# Patient Record
Sex: Female | Born: 1955 | Hispanic: No | Marital: Married | Smoking: Never smoker
Health system: Southern US, Community
[De-identification: ages and names within clinical notes are randomized; demographics above are authoritative.]

## PROBLEM LIST (undated history)

## (undated) DIAGNOSIS — U071 COVID-19: Secondary | ICD-10-CM

## (undated) DIAGNOSIS — K589 Irritable bowel syndrome without diarrhea: Secondary | ICD-10-CM

## (undated) DIAGNOSIS — F419 Anxiety disorder, unspecified: Secondary | ICD-10-CM

## (undated) DIAGNOSIS — D649 Anemia, unspecified: Secondary | ICD-10-CM

## (undated) DIAGNOSIS — K219 Gastro-esophageal reflux disease without esophagitis: Secondary | ICD-10-CM

## (undated) DIAGNOSIS — E079 Disorder of thyroid, unspecified: Secondary | ICD-10-CM

## (undated) HISTORY — DX: Gastro-esophageal reflux disease without esophagitis: K21.9

## (undated) HISTORY — DX: Irritable bowel syndrome, unspecified: K58.9

## (undated) HISTORY — DX: Anxiety disorder, unspecified: F41.9

## (undated) HISTORY — PX: HERNIA REPAIR: SHX51

## (undated) HISTORY — DX: COVID-19: U07.1

## (undated) HISTORY — DX: Disorder of thyroid, unspecified: E07.9

## (undated) HISTORY — DX: Anemia, unspecified: D64.9

---

## 2016-03-17 ENCOUNTER — Ambulatory Visit (INDEPENDENT_AMBULATORY_CARE_PROVIDER_SITE_OTHER): Payer: Self-pay | Admitting: Family Medicine

## 2016-03-17 VITALS — BP 122/82 | HR 92 | Temp 98.5°F | Resp 16 | Wt 166.0 lb

## 2016-03-17 DIAGNOSIS — K2100 Gastro-esophageal reflux disease with esophagitis, without bleeding: Secondary | ICD-10-CM

## 2016-03-17 MED ORDER — OMEPRAZOLE 40 MG PO CPDR: 40.0000 mg | DELAYED_RELEASE_CAPSULE | Freq: Every day | ORAL | 3 refills | Status: DC

## 2016-03-17 NOTE — Patient Instructions (Signed)
Start omeprazole 40 mg once daily for 6 weeks. Return for follow-up in 6 weeks.  I have placed a referral for gastroenterology.    IF you received an x-ray today, you will receive an invoice from [facility]. Please contact [facility] at [phone] with questions or concerns regarding your invoice.   IF you received labwork today, you will receive an invoice from [employer]. Please contact [facility] at [phone] with questions or concerns regarding your invoice.   Our billing staff will not be able to assist you with questions regarding bills from these companies.  You will be contacted with the lab results as soon as they are available. The fastest way to get your results is to activate your My Chart account. Instructions are located on the last page of this paperwork. If you have not heard from [place] regarding the results in 2 weeks, please contact this office.           Gastroesophageal Reflux Disease, Adult Normally, food travels down the esophagus and stays in the stomach to be digested. However, when a person has gastroesophageal reflux disease (GERD), food and stomach acid move back up into the esophagus. When this happens, the esophagus becomes sore and inflamed. Over time, GERD can create small holes (ulcers) in the lining of the esophagus.  CAUSES This condition is caused by a problem with the muscle between the esophagus and the stomach (lower esophageal sphincter, or LES). Normally, the LES muscle closes after food passes through the esophagus to the stomach. When the LES is weakened or abnormal, it does not close properly, and that allows food and stomach acid to go back up into the esophagus. The LES can be weakened by certain dietary substances, medicines, and medical conditions, including:  Tobacco use.  Pregnancy.  Having a hiatal hernia.  Heavy alcohol use.  Certain foods and beverages, such as coffee, chocolate,  onions, and peppermint. RISK FACTORS This condition is more likely to develop in:  People who have an increased body weight.  People who have connective tissue disorders.  People who use NSAID medicines. SYMPTOMS Symptoms of this condition include:  Heartburn.  Difficult or painful swallowing.  The feeling of having a lump in the throat.  Abitter taste in the mouth.  Bad breath.  Having a large amount of saliva.  Having an upset or bloated stomach.  Belching.  Chest pain.  Shortness of breath or wheezing.  Ongoing (chronic) cough or a night-time cough.  Wearing away of tooth enamel.  Weight loss. Different conditions can cause chest pain. Make sure to see your health care provider if you experience chest pain. DIAGNOSIS Your health care provider will take a medical history and perform a physical exam. To determine if you have mild or severe GERD, your health care provider may also monitor how you respond to treatment. You may also have other tests, including:  An endoscopy toexamine your stomach and esophagus with a small camera.  A test thatmeasures the acidity level in your esophagus.  A test thatmeasures how much pressure is on your esophagus.  A barium swallow or modified barium swallow to show the shape, size, and functioning of your esophagus. TREATMENT The goal of treatment is to help relieve your symptoms and to prevent complications. Treatment for this condition may vary depending on how severe your symptoms are. Your health care provider may recommend:  Changes to your diet.  Medicine.  Surgery. HOME CARE INSTRUCTIONS Diet  Follow a diet  as recommended by your health care provider. This may involve avoiding foods and drinks such as:  Coffee and tea (with or without caffeine).  Drinks that containalcohol.  Energy drinks and sports drinks.  Carbonated drinks or sodas.  Chocolate and cocoa.  Peppermint and mint flavorings.  Garlic  and onions.  Horseradish.  Spicy and acidic foods, including peppers, chili powder, curry powder, vinegar, hot sauces, and barbecue sauce.  Citrus fruit juices and citrus fruits, such as oranges, lemons, and limes.  Tomato-based foods, such as red sauce, chili, salsa, and pizza with red sauce.  Fried and fatty foods, such as donuts, french fries, potato chips, and high-fat dressings.  High-fat meats, such as hot dogs and fatty cuts of red and white meats, such as rib eye steak, sausage, ham, and bacon.  High-fat dairy items, such as whole milk, butter, and cream cheese.  Eat small, frequent meals instead of large meals.  Avoid drinking large amounts of liquid with your meals.  Avoid eating meals during the 2-3 hours before bedtime.  Avoid lying down right after you eat.  Do not exercise right after you eat. General Instructions  Pay attention to any changes in your symptoms.  Take over-the-counter and prescription medicines only as told by your health care provider. Do not take aspirin, ibuprofen, or other NSAIDs unless your health care provider told you to do so.  Do not use any tobacco products, including cigarettes, chewing tobacco, and e-cigarettes. If you need help quitting, ask your health care provider.  Wear loose-fitting clothing. Do not wear anything tight around your waist that causes pressure on your abdomen.  Raise (elevate) the head of your bed 6 inches (15cm).  Try to reduce your stress, such as with yoga or meditation. If you need help reducing stress, ask your health care provider.  If you are overweight, reduce your weight to an amount that is healthy for you. Ask your health care provider for guidance about a safe weight loss goal.  Keep all follow-up visits as told by your health care provider. This is important. SEEK MEDICAL CARE IF:  You have new symptoms.  You have unexplained weight loss.  You have difficulty swallowing, or it hurts to  swallow.  You have wheezing or a persistent cough.  Your symptoms do not improve with treatment.  You have a hoarse voice. SEEK IMMEDIATE MEDICAL CARE IF:  You have pain in your arms, neck, jaw, teeth, or back.  You feel sweaty, dizzy, or light-headed.  You have chest pain or shortness of breath.  You vomit and your vomit looks like blood or coffee grounds.  You faint.  Your stool is bloody or black.  You cannot swallow, drink, or eat.   This information is not intended to replace advice given to you by your health care provider. Make sure you discuss any questions you have with your health care provider.   Document Released: 03/16/2005 Document Revised: 02/25/2015 Document Reviewed: 10/01/2014 Elsevier Interactive Patient Education [employer] Elsevier Inc.

## 2016-03-17 NOTE — Progress Notes (Signed)
Patient ID: Kristen Bender, female    DOB: 08/25/1955, 60 y.o.   MRN: [id number]  PCP: No primary care provider on file.  Chief Complaint  Patient presents with  . Gastroesophageal Reflux    x 2 months with nausea and burning epigastric. constipation .    Subjective:   HPI 60 year old, female, presents for evaluation of nausea, burning, and epigastric pain time 2 months. She and her husband very recently arrived to [place] from [place]. While in [place], the patient received a complete gastroenterology work up and has copies of findings.  The results are written in her native language.  She reports a known history of acid reflux and feels that is the underlying cause of her symptoms. She reports nausea that occurs with burning epigastric pain which worsened with food and at bedtime.  She reports experiencing burning in throat with cough although this doesn't occur all the time.  She reports previously taking ranitidine and rabeprazole with some relief of symptoms. She is now taking medications prescribed in [place] which she reports are not relieving her symptoms at all.  Social History   Social History  . Marital status: Married    Spouse name: N/A  . Number of children: N/A  . Years of education: N/A   Occupational History  . Not on file.   Social History Main Topics  . Smoking status: Never Smoker  . Smokeless tobacco: Never Used  . Alcohol use Not on file  . Drug use: Unknown  . Sexual activity: Not on file   Other Topics Concern  . Not on file   Social History Narrative  . No narrative on file   Family History  Problem Relation Age of Onset  . Heart disease Mother   . Hypertension Mother   . Mental illness Mother   . Heart disease Father   . Hypertension Father    Review of Systems  See HPI  There are no active problems to display for this patient.   Prior to Admission medications   Medication Sig Start Date End Date Taking? Authorizing Provider    Cyanocobalamin (B12 LIQUID HEALTH BOOSTER PO) Take by mouth.   Yes Historical Provider, MD  sertraline (ZOLOFT) 50 MG tablet Take 50 mg by mouth daily.   Yes Historical Provider, MD   No Known Allergies     Objective:  Physical Exam  Constitutional: She is oriented to person, place, and time. She appears well-developed and well-nourished.  HENT:  Head: Normocephalic and atraumatic.  Right Ear: External ear normal.  Left Ear: External ear normal.  Nose: Nose normal.  Mouth/Throat: Oropharynx is clear and moist.  Eyes: Conjunctivae and EOM are normal. Pupils are equal, round, and reactive to light.  Neck: Normal range of motion. Neck supple.  Cardiovascular: Normal rate, regular rhythm, normal heart sounds and intact distal pulses.   Pulmonary/Chest: Effort normal and breath sounds normal.  Abdominal: Soft. Bowel sounds are normal.  Musculoskeletal: Normal range of motion.  Neurological: She is alert and oriented to person, place, and time.  Skin: Skin is warm and dry.  Psychiatric: She has a normal mood and affect. Her behavior is normal. Judgment and thought content normal.   Vitals:   03/17/16 1206  BP: 122/82  Pulse: 92  Resp: 16  Temp: 98.5 F (36.9 C)     Assessment & Plan:  1. Gastroesophageal reflux disease with esophagitis 60 year old female presents with symptoms that are likely related to acid  reflux flare.  She presents multiple copies of diagnostic studies all transcribed in her native language.   All diagnostic studies were obtained less than 30 days ago. Today, treat the acid reflux symptoms and refer patient to local gastroenterologist as she was previously under specialist care in her native country.  Plan:   . omeprazole (PRILOSEC) 40 MG capsule    Sig: Take 1 capsule (40 mg total) by mouth daily.    Dispense:  30 capsule    Refill:  3    Order Specific Question:   Supervising Provider    Answer:   Clelia CroftSHAW, EVA N [[id number]]   - Ambulatory referral to  Gastroenterology  [facility].  Godfrey PickKimberly S. Tiburcio PeaHarris, MSN, FNP-C [facility]

## 2016-03-18 DIAGNOSIS — K219 Gastro-esophageal reflux disease without esophagitis: Secondary | ICD-10-CM | POA: Insufficient documentation

## 2016-06-23 ENCOUNTER — Encounter: Payer: Self-pay | Admitting: Family Medicine

## 2019-06-21 DIAGNOSIS — J8489 Other specified interstitial pulmonary diseases: Secondary | ICD-10-CM

## 2019-06-21 HISTORY — DX: Other specified interstitial pulmonary diseases: J84.89

## 2019-09-02 ENCOUNTER — Emergency Department (HOSPITAL_COMMUNITY): Payer: HRSA Program

## 2019-09-02 ENCOUNTER — Encounter (HOSPITAL_COMMUNITY): Payer: Self-pay

## 2019-09-02 ENCOUNTER — Other Ambulatory Visit: Payer: Self-pay

## 2019-09-02 ENCOUNTER — Inpatient Hospital Stay (HOSPITAL_COMMUNITY)
Admission: EM | Admit: 2019-09-02 | Discharge: 2019-09-10 | DRG: 177 | Disposition: A | Discharge status: Home Health | Payer: HRSA Program | Attending: Internal Medicine | Admitting: Internal Medicine

## 2019-09-02 DIAGNOSIS — R739 Hyperglycemia, unspecified: Secondary | ICD-10-CM | POA: Diagnosis present

## 2019-09-02 DIAGNOSIS — K219 Gastro-esophageal reflux disease without esophagitis: Secondary | ICD-10-CM | POA: Diagnosis present

## 2019-09-02 DIAGNOSIS — J1282 Pneumonia due to coronavirus disease 2019: Secondary | ICD-10-CM | POA: Diagnosis present

## 2019-09-02 DIAGNOSIS — J309 Allergic rhinitis, unspecified: Secondary | ICD-10-CM | POA: Diagnosis present

## 2019-09-02 DIAGNOSIS — D539 Nutritional anemia, unspecified: Secondary | ICD-10-CM | POA: Diagnosis not present

## 2019-09-02 DIAGNOSIS — Z818 Family history of other mental and behavioral disorders: Secondary | ICD-10-CM

## 2019-09-02 DIAGNOSIS — Z8249 Family history of ischemic heart disease and other diseases of the circulatory system: Secondary | ICD-10-CM | POA: Diagnosis not present

## 2019-09-02 DIAGNOSIS — U071 COVID-19: Secondary | ICD-10-CM | POA: Diagnosis present

## 2019-09-02 DIAGNOSIS — I10 Essential (primary) hypertension: Secondary | ICD-10-CM | POA: Diagnosis present

## 2019-09-02 DIAGNOSIS — F329 Major depressive disorder, single episode, unspecified: Secondary | ICD-10-CM | POA: Diagnosis present

## 2019-09-02 DIAGNOSIS — T380X5A Adverse effect of glucocorticoids and synthetic analogues, initial encounter: Secondary | ICD-10-CM | POA: Diagnosis present

## 2019-09-02 DIAGNOSIS — K7689 Other specified diseases of liver: Secondary | ICD-10-CM | POA: Diagnosis present

## 2019-09-02 DIAGNOSIS — R0602 Shortness of breath: Secondary | ICD-10-CM

## 2019-09-02 DIAGNOSIS — Z79899 Other long term (current) drug therapy: Secondary | ICD-10-CM

## 2019-09-02 DIAGNOSIS — F410 Panic disorder [episodic paroxysmal anxiety] without agoraphobia: Secondary | ICD-10-CM | POA: Diagnosis present

## 2019-09-02 DIAGNOSIS — J9601 Acute respiratory failure with hypoxia: Secondary | ICD-10-CM | POA: Diagnosis present

## 2019-09-02 LAB — COMPREHENSIVE METABOLIC PANEL
ALT: 59 U/L — ABNORMAL HIGH (ref 0–44)
AST: 38 U/L (ref 15–41)
Albumin: 3.6 g/dL (ref 3.5–5.0)
Alkaline Phosphatase: 65 U/L (ref 38–126)
Anion gap: 13 (ref 5–15)
BUN: 15 mg/dL (ref 8–23)
CO2: 22 mmol/L (ref 22–32)
Calcium: 9.5 mg/dL (ref 8.9–10.3)
Chloride: 105 mmol/L (ref 98–111)
Creatinine, Ser: 0.89 mg/dL (ref 0.44–1.00)
GFR calc Af Amer: >60 mL/min (ref >60)
GFR calc non Af Amer: >60 mL/min (ref >60)
Glucose, Bld: 183 mg/dL — ABNORMAL HIGH (ref 70–99)
Potassium: 3.7 mmol/L (ref 3.5–5.1)
Sodium: 140 mmol/L (ref 135–145)
Total Bilirubin: 0.9 mg/dL (ref 0.3–1.2)
Total Protein: 7.9 g/dL (ref 6.5–8.1)

## 2019-09-02 LAB — CBC WITH DIFFERENTIAL/PLATELET
Abs Immature Granulocytes: 0.26 10*3/uL — ABNORMAL HIGH (ref 0.00–0.07)
Basophils Absolute: 0 10*3/uL (ref 0.0–0.1)
Basophils Relative: 0 %
Eosinophils Absolute: 0 10*3/uL (ref 0.0–0.5)
Eosinophils Relative: 0 %
HCT: 46.1 % — ABNORMAL HIGH (ref 36.0–46.0)
Hemoglobin: 14.8 g/dL (ref 12.0–15.0)
Immature Granulocytes: 2 %
Lymphocytes Relative: 8 %
Lymphs Abs: 1.1 10*3/uL (ref 0.7–4.0)
MCH: 32.1 pg (ref 26.0–34.0)
MCHC: 32.1 g/dL (ref 30.0–36.0)
MCV: 100 fL (ref 80.0–100.0)
Monocytes Absolute: 0.7 10*3/uL (ref 0.1–1.0)
Monocytes Relative: 5 %
Neutro Abs: 11.6 10*3/uL — ABNORMAL HIGH (ref 1.7–7.7)
Neutrophils Relative %: 85 %
Platelets: 353 10*3/uL (ref 150–400)
RBC: 4.61 MIL/uL (ref 3.87–5.11)
RDW: 12.8 % (ref 11.5–15.5)
WBC: 13.7 10*3/uL — ABNORMAL HIGH (ref 4.0–10.5)
nRBC: 0 % (ref 0.0–0.2)

## 2019-09-02 LAB — PROTIME-INR
INR: 1 (ref 0.8–1.2)
Prothrombin Time: 13.4 seconds (ref 11.4–15.2)

## 2019-09-02 LAB — BRAIN NATRIURETIC PEPTIDE: B Natriuretic Peptide: 80.7 pg/mL (ref 0.0–100.0)

## 2019-09-02 LAB — TROPONIN I (HIGH SENSITIVITY)
Troponin I (High Sensitivity): 12 ng/L (ref <18)
Troponin I (High Sensitivity): 61 ng/L — ABNORMAL HIGH (ref <18)

## 2019-09-02 LAB — APTT: aPTT: 26 seconds (ref 24–36)

## 2019-09-02 MED ORDER — SODIUM CHLORIDE 0.9 % IV SOLN
100.0000 mg | INTRAVENOUS | Status: AC
  Administered 2019-09-03 (×2): 100 mg via INTRAVENOUS
  Filled 2019-09-02 (×2): qty 20

## 2019-09-02 MED ORDER — SODIUM CHLORIDE 0.9 % IV BOLUS (SEPSIS)
1000.0000 mL | Freq: Once | INTRAVENOUS | Status: AC
  Administered 2019-09-02: 1000 mL via INTRAVENOUS

## 2019-09-02 MED ORDER — ALBUTEROL SULFATE HFA 108 (90 BASE) MCG/ACT IN AERS
8.0000 | INHALATION_SPRAY | Freq: Once | RESPIRATORY_TRACT | Status: AC
  Administered 2019-09-02: 8 via RESPIRATORY_TRACT
  Filled 2019-09-02: qty 6.7

## 2019-09-02 MED ORDER — ALBUTEROL (5 MG/ML) CONTINUOUS INHALATION SOLN: 10.0000 mg/h | INHALATION_SOLUTION | Freq: Once | RESPIRATORY_TRACT | Status: DC

## 2019-09-02 MED ORDER — SODIUM CHLORIDE 0.9 % IV SOLN
100.0000 mg | Freq: Every day | INTRAVENOUS | Status: AC
  Administered 2019-09-04 – 2019-09-07 (×4): 100 mg via INTRAVENOUS
  Filled 2019-09-02 (×4): qty 20

## 2019-09-02 MED ORDER — IOHEXOL 350 MG/ML SOLN
100.0000 mL | Freq: Once | INTRAVENOUS | Status: AC | PRN
  Administered 2019-09-02: 100 mL via INTRAVENOUS

## 2019-09-02 MED ORDER — SODIUM CHLORIDE 0.9 % IV SOLN
500.0000 mg | INTRAVENOUS | Status: DC
  Administered 2019-09-02: 500 mg via INTRAVENOUS
  Filled 2019-09-02: qty 500

## 2019-09-02 MED ORDER — SODIUM CHLORIDE 0.9 % IV SOLN
2.0000 g | INTRAVENOUS | Status: DC
  Administered 2019-09-02: 2 g via INTRAVENOUS
  Filled 2019-09-02: qty 20

## 2019-09-02 MED ORDER — SODIUM CHLORIDE 0.9 % IV BOLUS (SEPSIS)
500.0000 mL | Freq: Once | INTRAVENOUS | Status: AC
  Administered 2019-09-02: 500 mL via INTRAVENOUS

## 2019-09-02 MED ORDER — SODIUM CHLORIDE (PF) 0.9 % IJ SOLN
INTRAMUSCULAR | Status: AC
  Filled 2019-09-02: qty 50

## 2019-09-02 NOTE — ED Provider Notes (Signed)
Medical screening examination/treatment/procedure(s) were conducted as a shared visit with non-physician practitioner(s) and myself.  I personally evaluated the patient during the encounter.  EKG Interpretation  Date/Time:  Monday September 02 2019 19:02:55 EDT Ventricular Rate:  114 PR Interval:    QRS Duration: 87 QT Interval:  277 QTC Calculation: 382 R Axis:   27 Text Interpretation: Sinus tachycardia Nonspecific repol abnormality, diffuse leads No old tracing to compare Confirmed by Lorre Nick ([id number]) on 09/02/2019 7:42:63 PM  64 year old female diagnosed with Covid 2 weeks ago presents with shortness of breath.  Suspect PE and will CT her chest and support her oxygenation.   Lorre Nick, MD 09/02/19 Serena Croissant

## 2019-09-02 NOTE — H&P (Signed)
History and Physical    Kristen Bender ZHY:[id number] DOB: 1955-09-09 DOA: 09/02/2019  PCP: Bing Neighbors, FNP  Patient coming from: Home  Chief Complaint: SOB  HPI: Kristen Bender is a 64 y.o. female with medical history significant of IBS, GERD, anxiety, depression, allergies who presents for 1 day of SOB.  She notes that she developed symptoms including cough about 2 weeks ago.  She was tested and resulted positive for COVID 19 8 days ago per report in the ED.  She developed SOB 1 day ago and was found to be hypoxic on evaluation in the ED.  Associated symptoms include dry cough, chest pain with coughing and taking a deep breath.  Otherwise she denies fever, chills, nausea, vomiting, diarrhea, constipation, myalgias, headache, leg swelling, change in vision, dizziness.    ED Course: In the ED, she was noted to be tachycardic and hypoxic with increased RR.  She initially was on a high level of oxygen and this has been decreased to 8L by NRB.  Blood worked showed a WBC of 13.7 with a neutrophil predominance, CXR with multifocal pneumonia, CT chest showed moderately severe diffuse infiltrate without PE noted.  BP was on the high side.   She was initiated with antibiotics for CAP and BC were drawn.   Review of Systems: As per HPI otherwise 10 point review of systems negative.   She reports not having thyroid disease and she is not on medication for it.  Past Medical History:  Diagnosis Date  . Anemia   . Anxiety   . GERD (gastroesophageal reflux disease)   . IBS (irritable bowel syndrome)   . Thyroid disease     Past Surgical History:  Procedure Laterality Date  . CESAREAN SECTION     X 2     reports that she has never smoked. She has never used smokeless tobacco. She reports previous alcohol use. She reports that she does not use drugs.  No Known Allergies  Family History  Problem Relation Age of Onset  . Heart disease Mother   . Hypertension Mother   . Mental illness  Mother   . Heart disease Father   . Hypertension Father     Prior to Admission medications   Medication Sig Start Date End Date Taking? Authorizing Provider  albuterol (VENTOLIN HFA) 108 (90 Base) MCG/ACT inhaler Inhale 1-2 puffs into the lungs every 6 (six) hours as needed for wheezing or shortness of breath.   Yes [provider]         benzonatate (TESSALON) 200 MG capsule Take 200 mg by mouth 3 (three) times daily. 08/26/19  Yes [provider]  clonazePAM (KLONOPIN) 1 MG tablet Take 1 mg by mouth at bedtime.   Yes [provider]  loratadine (CLARITIN) 10 MG tablet Take 10 mg by mouth daily. 08/26/19  Yes [provider]  sertraline (ZOLOFT) 50 MG tablet Take 50 mg by mouth daily.   Yes [provider]  omeprazole (PRILOSEC) 40 MG capsule Take 1 capsule (40 mg total) by mouth daily. Patient not taking: Reported on 09/02/2019 03/17/16   Bing Neighbors, FNP    Physical Exam: Vitals:   09/02/19 2023 09/02/19 2030 09/02/19 2130 09/02/19 2220  BP:  (!) 142/87 (!) 151/90 (!) 170/86  Pulse: (!) 130 (!) 126 (!) 117 (!) 108  Resp: (!) 32 (!) 8 (!) 40 (!) 30  Temp:      TempSrc:      SpO2: 93% 92% 91%  95%    Constitutional: NAD, calm, comfortable despite SOB Eyes:  lids and conjunctivae normal ENMT: Mucous membranes are moist. NRB mask in place Neck: normal, supple Respiratory: Diffuse decreased breath sounds, diffuse fine crackles, no rales, no wheezing Cardiovascular: Tachycardic, regular, no murmur noted.  Pulses intact in radial arteries bilaterally.  Abdomen: Soft, NT, ND, +BS Musculoskeletal: no clubbing / cyanosis.  Normal muscle tone.  Skin: no rashes, lesions, ulcers on exposed skin Neurologic: Grossly intact, moving easily in bed Psychiatric: Normal judgment and insight. Alert and oriented x 3. Normal mood.    Labs on Admission: I have personally reviewed following labs and imaging studies  CBC: Recent Labs  Lab  09/02/19 1912  WBC 13.7*  NEUTROABS 11.6*  HGB 14.8  HCT 46.1*  MCV 100.0  PLT 973   Basic Metabolic Panel: Recent Labs  Lab 09/02/19 1912  NA 140  K 3.7  CL 105  CO2 22  GLUCOSE 183*  BUN 15  CREATININE 0.89  CALCIUM 9.5   GFR: CrCl cannot be calculated (Unknown ideal weight.). Liver Function Tests: Recent Labs  Lab 09/02/19 1912  AST 38  ALT 59*  ALKPHOS 65  BILITOT 0.9  PROT 7.9  ALBUMIN 3.6   No results for input(s): LIPASE, AMYLASE in the last 168 hours. No results for input(s): AMMONIA in the last 168 hours. Coagulation Profile: Recent Labs  Lab 09/02/19 1912  INR 1.0   Cardiac Enzymes: No results for input(s): CKTOTAL, CKMB, CKMBINDEX, TROPONINI in the last 168 hours. BNP (last 3 results) No results for input(s): PROBNP in the last 8760 hours. HbA1C: No results for input(s): HGBA1C in the last 72 hours. CBG: No results for input(s): GLUCAP in the last 168 hours. Lipid Profile: No results for input(s): CHOL, HDL, LDLCALC, TRIG, CHOLHDL, LDLDIRECT in the last 72 hours. Thyroid Function Tests: No results for input(s): TSH, T4TOTAL, FREET4, T3FREE, THYROIDAB in the last 72 hours. Anemia Panel: No results for input(s): VITAMINB12, FOLATE, FERRITIN, TIBC, IRON, RETICCTPCT in the last 72 hours. Urine analysis: No results found for: COLORURINE, APPEARANCEUR, LABSPEC, PHURINE, GLUCOSEU, HGBUR, BILIRUBINUR, KETONESUR, PROTEINUR, UROBILINOGEN, NITRITE, LEUKOCYTESUR  Radiological Exams on Admission: CT Angio Chest PE W and/or Wo Contrast  Result Date: 09/02/2019 CLINICAL DATA:  Shortness of breath. EXAM: CT ANGIOGRAPHY CHEST WITH CONTRAST TECHNIQUE: Multidetector CT imaging of the chest was performed using the standard protocol during bolus administration of intravenous contrast. Multiplanar CT image reconstructions and MIPs were obtained to evaluate the vascular anatomy. CONTRAST:  156mL OMNIPAQUE IOHEXOL 350 MG/ML SOLN COMPARISON:  None. FINDINGS:  Cardiovascular: Satisfactory opacification of the pulmonary arteries to the segmental level. No evidence of pulmonary embolism. Normal heart size. No pericardial effusion. Mediastinum/Nodes: No enlarged mediastinal, hilar, or axillary lymph nodes. Thyroid gland, trachea, and esophagus demonstrate no significant findings. Lungs/Pleura: Moderate severity diffuse areas of atelectasis and/or infiltrate are seen bilaterally, slightly more prominent within the bilateral lung bases. There is no evidence of a pleural effusion or pneumothorax. Upper Abdomen: A 3.7 cm x 2.1 cm cystic appearing areas seen within the posteromedial aspect of the right lobe of the liver. Musculoskeletal: Multilevel degenerative changes seen throughout the thoracic spine. Review of the MIP images confirms the above findings. IMPRESSION: 1. No evidence of acute pulmonary embolism. 2. Moderate severity diffuse areas of atelectasis and/or infiltrate bilaterally, slightly more prominent within the bilateral lung bases. 3. 3.7 cm cystic appearing lesion in the right lobe of the liver. Electronically Signed   By: Joyce Gross.D.  On: 09/02/2019 21:28   DG Chest Port 1 View  Result Date: 09/02/2019 CLINICAL DATA:  Short of breath. EXAM: PORTABLE CHEST 1 VIEW COMPARISON:  None FINDINGS: Cardiac enlargement. Decreased lung volumes. Bilateral peripheral and lower lobe predominant opacities are identified. No pleural effusion or edema. IMPRESSION: Bilateral peripheral and lower lobe predominant opacities, which in the acute setting are concerning for multifocal pneumonia. Electronically Signed   By: Signa Kell M.D.   On: 09/02/2019 19:51    EKG: Independently reviewed. Sinus tachycardia, wandering baseline  Assessment/Plan  Pneumonia due to COVID-19 virus - Discontinue antibiotics - Follow up BC - Admit to SDU - Continue oxygen to keep O2 saturation > 90% while awake, > 85% while asleep - Checking inflammatory markers, if CRP  elevated, consider starting actemra - Remdesivir pharmacy consult - Dexamethasone 6mg  daily for 10 days, monitor blood sugar - Telemetry - IVF with NS bolus followed by 100cc/hr  - Airborne/Contact precautions - Check lactic acid - Albuterol PRN for wheezing - Benzonatate for coughing  Anxiety/Depression - She takes daily Clonazepam and sertraline which were continued  Allergies - Continue loratadine  DVT prophylaxis: Lovenox Code Status: Full Code  Disposition Plan: Admit to inpatient for Remdesivir and oxygen  Consults called: None  Admission status: SDU, INP   MD [facility]  If 7PM-7AM, please contact night-coverage [email or web address] Use universal [place] password for that web site. If you do not have the password, please call the hospital operator.  09/02/2019, 10:29 PM

## 2019-09-02 NOTE — ED Triage Notes (Addendum)
Pt reports SHOB starting yesterday. Pt SpO2 82% RA in triage. Pt placed on 6L and SpO2 was 88%. Pt now on 92% on 10L non-rebreather. Diminished breath sounds in all fields. Pt reports testing positive for COVID x2 weeks ago.

## 2019-09-02 NOTE — ED Notes (Signed)
Patient transported to CT 

## 2019-09-02 NOTE — ED Provider Notes (Signed)
Patient signed out to me by K. Sofia, PA-C.  Please see previous notes for further history.  In brief, patient presenting for evaluation of worsening shortness of breath in the setting of the Covid infection.  Patient tested positive for about 8 days ago, symptoms began around 2 weeks ago.  Today, she was found to be hypoxic with sats in the 80s on room air.  She does not wear oxygen at baseline.  Currently satting in the low 90s on 8 L via nonrebreather.  Patient has been intermittently tachypneic and tachycardic, as such concern for PE.  CTA is pending.  Patient will need to be admitted regardless.  CTA negative for PE, consistent with Covid pneumonia.  Will admit to hospitalist service.  Discussed with Dr Criselda Peaches from [facility], patient to be admitted.   Alveria Apley, PA-C 09/02/19 2205    Bethann Berkshire, MD 09/03/19 1124

## 2019-09-02 NOTE — ED Provider Notes (Signed)
[facility]-EMERGENCY DEPT Provider Note   CSN: [id number] Arrival date & time: 09/02/19  1857     History Chief Complaint  Patient presents with  . Shortness of Breath    Kristen Bender is a 64 y.o. female.  The history is provided by the patient. No language interpreter was used.  Shortness of Breath Severity:  Severe Onset quality:  Gradual Duration:  1 day Timing:  Constant Progression:  Worsening Chronicity:  New Relieved by:  Nothing Worsened by:  Nothing Ineffective treatments:  None tried Associated symptoms: no abdominal pain and no fever   Pt reports she was diagnosed with covid 2 weeks ago.  Pt reports she became short of breath today.       Past Medical History:  Diagnosis Date  . Anemia   . Anxiety   . GERD (gastroesophageal reflux disease)   . IBS (irritable bowel syndrome)   . Thyroid disease     Patient Active Problem List   Diagnosis Date Noted  . Acid reflux 03/18/2016    Past Surgical History:  Procedure Laterality Date  . HERNIA REPAIR       OB History   No obstetric history on file.     Family History  Problem Relation Age of Onset  . Heart disease Mother   . Hypertension Mother   . Mental illness Mother   . Heart disease Father   . Hypertension Father     Social History   Tobacco Use  . Smoking status: Never Smoker  . Smokeless tobacco: Never Used  Substance Use Topics  . Alcohol use: Not on file  . Drug use: Not on file    Home Medications Prior to Admission medications   Medication Sig Start Date End Date Taking? Authorizing Provider  Cyanocobalamin (B12 LIQUID HEALTH BOOSTER PO) Take by mouth.    [provider]  omeprazole (PRILOSEC) 40 MG capsule Take 1 capsule (40 mg total) by mouth daily. 03/17/16   Bing Neighbors, FNP  sertraline (ZOLOFT) 50 MG tablet Take 50 mg by mouth daily.    [provider]    Allergies    Patient has no known allergies.  Review of  Systems   Review of Systems  Constitutional: Negative for fever.  Respiratory: Positive for shortness of breath.   Gastrointestinal: Negative for abdominal pain.  All other systems reviewed and are negative.   Physical Exam Updated Vital Signs BP (!) 144/100   Pulse (!) 109   Temp 99.5 F (37.5 C) (Oral)   Resp (!) 30   SpO2 93%   Physical Exam Vitals and nursing note reviewed.  Constitutional:      Appearance: She is well-developed.  HENT:     Head: Normocephalic.  Cardiovascular:     Rate and Rhythm: Tachycardia present.  Pulmonary:     Effort: Tachypnea present.     Breath sounds: Decreased breath sounds present.  Chest:     Chest wall: No deformity.  Abdominal:     General: There is no distension.  Musculoskeletal:        General: Normal range of motion.     Cervical back: Normal range of motion.  Skin:    General: Skin is warm.  Neurological:     Mental Status: She is alert and oriented to person, place, and time.  Psychiatric:        Mood and Affect: Mood normal.     ED Results / Procedures / Treatments  Labs (all labs ordered are listed, but only abnormal results are displayed) Labs Reviewed  CBC WITH DIFFERENTIAL/PLATELET - Abnormal; Notable for the following components:      Result Value   WBC 13.7 (*)    HCT 46.1 (*)    Neutro Abs 11.6 (*)    Abs Immature Granulocytes 0.26 (*)    All other components within normal limits  COMPREHENSIVE METABOLIC PANEL  PROTIME-INR  BRAIN NATRIURETIC PEPTIDE  TROPONIN I (HIGH SENSITIVITY)    EKG None  Radiology No results found.  Procedures Procedures (including critical care time)  Medications Ordered in ED Medications  albuterol (PROVENTIL,VENTOLIN) solution continuous neb (has no administration in time range)    ED Course  I have reviewed the triage vital signs and the nursing notes.  Pertinent labs & imaging results that were available during my care of the patient were reviewed by me and  considered in my medical decision making (see chart for details).    MDM Rules/Calculators/A&P                      MDM: Pt placed on 15 liters at triage. Pt decreased to 8 liters.  Pt given albuterol 8 puffs. Pt's chest xray shows multifocal pneumonia.  Pt's care turned over to Lennie Odor PA at 9pm Final Clinical Impression(s) / ED Diagnoses Final diagnoses:  Pneumonia due to COVID-19 virus    Rx / DC Orders ED Discharge Orders    None       Sidney Ace 09/03/19 0802    Lacretia Leigh, MD 09/03/19 1730

## 2019-09-02 NOTE — ED Notes (Addendum)
x2 unsuccessful attempts at 2nd IV and obtaining blood cultures.

## 2019-09-03 DIAGNOSIS — J1282 Pneumonia due to coronavirus disease 2019: Secondary | ICD-10-CM

## 2019-09-03 DIAGNOSIS — U071 COVID-19: Principal | ICD-10-CM

## 2019-09-03 DIAGNOSIS — K219 Gastro-esophageal reflux disease without esophagitis: Secondary | ICD-10-CM

## 2019-09-03 LAB — CBC WITH DIFFERENTIAL/PLATELET
Abs Immature Granulocytes: 0.17 10*3/uL — ABNORMAL HIGH (ref 0.00–0.07)
Basophils Absolute: 0 10*3/uL (ref 0.0–0.1)
Basophils Relative: 0 %
Eosinophils Absolute: 0 10*3/uL (ref 0.0–0.5)
Eosinophils Relative: 0 %
HCT: 40.5 % (ref 36.0–46.0)
Hemoglobin: 12.3 g/dL (ref 12.0–15.0)
Immature Granulocytes: 2 %
Lymphocytes Relative: 15 %
Lymphs Abs: 1.6 10*3/uL (ref 0.7–4.0)
MCH: 31.2 pg (ref 26.0–34.0)
MCHC: 30.4 g/dL (ref 30.0–36.0)
MCV: 102.8 fL — ABNORMAL HIGH (ref 80.0–100.0)
Monocytes Absolute: 0.8 10*3/uL (ref 0.1–1.0)
Monocytes Relative: 8 %
Neutro Abs: 8.1 10*3/uL — ABNORMAL HIGH (ref 1.7–7.7)
Neutrophils Relative %: 75 %
Platelets: 269 10*3/uL (ref 150–400)
RBC: 3.94 MIL/uL (ref 3.87–5.11)
RDW: 13 % (ref 11.5–15.5)
WBC: 10.7 10*3/uL — ABNORMAL HIGH (ref 4.0–10.5)
nRBC: 0 % (ref 0.0–0.2)

## 2019-09-03 LAB — LACTATE DEHYDROGENASE: LDH: 202 U/L — ABNORMAL HIGH (ref 98–192)

## 2019-09-03 LAB — PROCALCITONIN: Procalcitonin: <0.1 ng/mL

## 2019-09-03 LAB — HEMOGLOBIN A1C
Hgb A1c MFr Bld: 5.2 % (ref 4.8–5.6)
Mean Plasma Glucose: 102.54 mg/dL

## 2019-09-03 LAB — CREATININE, SERUM
Creatinine, Ser: 0.68 mg/dL (ref 0.44–1.00)
GFR calc Af Amer: >60 mL/min (ref >60)
GFR calc non Af Amer: >60 mL/min (ref >60)

## 2019-09-03 LAB — FIBRINOGEN: Fibrinogen: 626 mg/dL — ABNORMAL HIGH (ref 210–475)

## 2019-09-03 LAB — D-DIMER, QUANTITATIVE: D-Dimer, Quant: 0.86 ug/mL-FEU — ABNORMAL HIGH (ref 0.00–0.50)

## 2019-09-03 LAB — MAGNESIUM: Magnesium: 2 mg/dL (ref 1.7–2.4)

## 2019-09-03 LAB — HIV ANTIBODY (ROUTINE TESTING W REFLEX): HIV Screen 4th Generation wRfx: NONREACTIVE

## 2019-09-03 LAB — C-REACTIVE PROTEIN: CRP: 5.6 mg/dL — ABNORMAL HIGH (ref <1.0)

## 2019-09-03 LAB — ABO/RH: ABO/RH(D): A POS

## 2019-09-03 LAB — FERRITIN: Ferritin: 234 ng/mL (ref 11–307)

## 2019-09-03 LAB — PHOSPHORUS: Phosphorus: 2.4 mg/dL — ABNORMAL LOW (ref 2.5–4.6)

## 2019-09-03 MED ORDER — POTASSIUM & SODIUM PHOSPHATES 280-160-250 MG PO PACK
1.0000 | PACK | Freq: Three times a day (TID) | ORAL | Status: AC
  Administered 2019-09-03 – 2019-09-05 (×7): 1 via ORAL
  Filled 2019-09-03 (×8): qty 1

## 2019-09-03 MED ORDER — ENOXAPARIN SODIUM 40 MG/0.4ML ~~LOC~~ SOLN: 40.0000 mg | SUBCUTANEOUS | Status: DC

## 2019-09-03 MED ORDER — SODIUM CHLORIDE 0.9 % IV SOLN: 200.0000 mg | Freq: Once | INTRAVENOUS | Status: DC

## 2019-09-03 MED ORDER — SERTRALINE HCL 50 MG PO TABS
50.0000 mg | ORAL_TABLET | Freq: Every day | ORAL | Status: DC
  Administered 2019-09-03 – 2019-09-08 (×6): 50 mg via ORAL
  Filled 2019-09-03 (×6): qty 1

## 2019-09-03 MED ORDER — DEXAMETHASONE 6 MG PO TABS
6.0000 mg | ORAL_TABLET | ORAL | Status: DC
  Administered 2019-09-03 – 2019-09-08 (×6): 6 mg via ORAL
  Filled 2019-09-03 (×7): qty 1

## 2019-09-03 MED ORDER — GUAIFENESIN-DM 100-10 MG/5ML PO SYRP
10.0000 mL | ORAL_SOLUTION | ORAL | Status: DC | PRN
  Administered 2019-09-03 – 2019-09-06 (×7): 10 mL via ORAL
  Filled 2019-09-03 (×8): qty 10

## 2019-09-03 MED ORDER — HYDRALAZINE HCL 25 MG PO TABS
25.0000 mg | ORAL_TABLET | Freq: Three times a day (TID) | ORAL | Status: DC | PRN
  Administered 2019-09-03: 25 mg via ORAL
  Filled 2019-09-03: qty 3

## 2019-09-03 MED ORDER — ENOXAPARIN SODIUM 40 MG/0.4ML ~~LOC~~ SOLN
40.0000 mg | SUBCUTANEOUS | Status: DC
  Administered 2019-09-03 – 2019-09-09 (×7): 40 mg via SUBCUTANEOUS
  Filled 2019-09-03 (×7): qty 0.4

## 2019-09-03 MED ORDER — SODIUM CHLORIDE 0.9 % IV SOLN: 100.0000 mg | Freq: Every day | INTRAVENOUS | Status: DC

## 2019-09-03 MED ORDER — ALBUTEROL SULFATE HFA 108 (90 BASE) MCG/ACT IN AERS
1.0000 | INHALATION_SPRAY | Freq: Four times a day (QID) | RESPIRATORY_TRACT | Status: DC | PRN
  Administered 2019-09-04 – 2019-09-07 (×4): 2 via RESPIRATORY_TRACT
  Filled 2019-09-03: qty 6.7

## 2019-09-03 MED ORDER — LORATADINE 10 MG PO TABS
10.0000 mg | ORAL_TABLET | Freq: Every day | ORAL | Status: DC
  Administered 2019-09-03 – 2019-09-10 (×8): 10 mg via ORAL
  Filled 2019-09-03 (×8): qty 1

## 2019-09-03 MED ORDER — SODIUM CHLORIDE 0.9% FLUSH
3.0000 mL | Freq: Two times a day (BID) | INTRAVENOUS | Status: DC
  Administered 2019-09-03 – 2019-09-10 (×14): 3 mL via INTRAVENOUS

## 2019-09-03 MED ORDER — BENZONATATE 100 MG PO CAPS
200.0000 mg | ORAL_CAPSULE | Freq: Three times a day (TID) | ORAL | Status: DC
  Administered 2019-09-03 – 2019-09-10 (×24): 200 mg via ORAL
  Filled 2019-09-03 (×25): qty 2

## 2019-09-03 MED ORDER — HYDROCOD POLST-CPM POLST ER 10-8 MG/5ML PO SUER
5.0000 mL | Freq: Two times a day (BID) | ORAL | Status: DC | PRN
  Administered 2019-09-03 – 2019-09-07 (×5): 5 mL via ORAL
  Filled 2019-09-03 (×5): qty 5

## 2019-09-03 MED ORDER — ALBUTEROL SULFATE HFA 108 (90 BASE) MCG/ACT IN AERS
2.0000 | INHALATION_SPRAY | Freq: Four times a day (QID) | RESPIRATORY_TRACT | Status: DC
  Administered 2019-09-03 – 2019-09-04 (×7): 2 via RESPIRATORY_TRACT
  Filled 2019-09-03: qty 6.7

## 2019-09-03 MED ORDER — CLONAZEPAM 1 MG PO TABS
1.0000 mg | ORAL_TABLET | Freq: Every day | ORAL | Status: DC
  Administered 2019-09-03 – 2019-09-05 (×5): 1 mg via ORAL
  Filled 2019-09-03 (×3): qty 2
  Filled 2019-09-03: qty 1

## 2019-09-03 MED ORDER — ACETAMINOPHEN 325 MG PO TABS
650.0000 mg | ORAL_TABLET | Freq: Four times a day (QID) | ORAL | Status: DC | PRN
  Administered 2019-09-03 – 2019-09-09 (×6): 650 mg via ORAL
  Filled 2019-09-03 (×6): qty 2

## 2019-09-03 NOTE — Progress Notes (Signed)
PROGRESS NOTE    Kristen Bender  JHE:[id number] DOB: [birth year] DOA: 09/02/2019 PCP: Scot Jun, FNP   Brief Narrative:   64 year old lady with prior history of GERD, IBS, anxiety, depression, tested positive for COVID 19 Two weeks ago as per the patient,  presents with worsening shortness of breath, chest pain on coughing.  On arrival to ED she was hypoxic requiring up to 15 L of high flow oxygen.  Chest x-ray showsBilateral peripheral and lower lobe predominant opacities, which in the acute setting are concerning for multifocal pneumonia. CT  Angiogram of the chest  Shows  Moderate severity diffuse areas of atelectasis and/or infiltrate bilaterally, slightly more prominent within the bilateral lung bases. Was referred to medical service for admission.  She was started on IV remdesivir and IV Decadron and admitted to stepdown for further evaluation and management Assessment & Plan:   Active Problems:   Pneumonia due to COVID-19 virus   GERD (gastroesophageal reflux disease)  Acute respiratory failure with hypoxia secondary to COVID-19 viral pneumonia Patient reports she was tested positive for COVID-19 2 weeks ago unfortunately we do not have the records in our epic. Chest x-ray and CT angiogram chest shows bilateral opacities concerning for multifocal pneumonia. Procalcitonin is less than 0.10.  CRP is 5.6, LDH is 202, B NP is 80. She was started on IV remdesivir and IV Decadron. She is currently requiring up to 15 L of high flow nasal cannula oxygen. Recommend incentive spirometry, flutter valve, bronchodilators as needed and proning as tolerated. Follow inflammatory markers.   Accelerated Hypertension:  - prn hydralazine.    Mild leukocytosis Continue to follow.    Hyperglycemia Getting hemoglobin A1c.   Hypophosphatemia Replaced  DVT prophylaxis: Lovenox.  Code Status: Full code.  Family Communication: none at bedside.  Disposition Plan:  . Patient came  from: Home.            . Anticipated d/c place: Home.  . Barriers to d/c OR conditions which need to be met to effect a safe d/c: ongoing respiratory issues requiring 15 it of HF oxygen.    Consultants:   None.   Procedures: none.   Antimicrobials: none   Subjective: Breathing better than yesterday. Still requiring 15 lit of HF nasal canula oxygen.  Reports having chest pain on coughing, denies nausea, vomiting and abdominal pain.    Objective: Vitals:   09/03/19 1000 09/03/19 1015 09/03/19 1030 09/03/19 1448  BP: (!) 174/92  (!) 170/96 (!) 158/96  Pulse: 82 84 75 76  Resp: 12 (!) 36  15  Temp:      TempSrc:      SpO2: 92% 91% 94% 99%  Weight:      Height:        Intake/Output Summary (Last 24 hours) at 09/03/2019 1506 Last data filed at 09/03/2019 0304 Gross per 24 hour  Intake 514.37 ml  Output --  Net 514.37 ml   Filed Weights   09/03/19 0314  Weight: 90.7 kg    Examination:  General exam: Mild distress from dyspnea on 15 L of high flow oxygen Respiratory system: Coarse breath sounds bilaterally, tachypnea present Cardiovascular system: S1 & S2 heard, tachycardic, no JVD,  No pedal edema. Gastrointestinal system: Abdomen is nondistended, soft and nontender. Normal bowel sounds heard. Central nervous system: Alert and oriented. No focal neurological deficits. Extremities:  No pedal edema, no cyanosis or clubbing.  Skin: No rashes, lesions or ulcers Psychiatry: Mood & affect appropriate.  Data Reviewed: I have personally reviewed following labs and imaging studies  CBC: Recent Labs  Lab 09/02/19 1912 09/03/19 0427  WBC 13.7* 10.7*  NEUTROABS 11.6* 8.1*  HGB 14.8 12.3  HCT 46.1* 40.5  MCV 100.0 102.8*  PLT 353 094   Basic Metabolic Panel: Recent Labs  Lab 09/02/19 1912 09/03/19 0427 09/03/19 0433  NA 140  --   --   K 3.7  --   --   CL 105  --   --   CO2 22  --   --   GLUCOSE 183*  --   --   BUN 15  --   --   CREATININE 0.89  --  0.68   CALCIUM 9.5  --   --   MG  --  2.0  --   PHOS  --  2.4*  --    GFR: Estimated Creatinine Clearance: 77.5 mL/min (by C-G formula based on SCr of 0.68 mg/dL). Liver Function Tests: Recent Labs  Lab 09/02/19 1912  AST 38  ALT 59*  ALKPHOS 65  BILITOT 0.9  PROT 7.9  ALBUMIN 3.6   No results for input(s): LIPASE, AMYLASE in the last 168 hours. No results for input(s): AMMONIA in the last 168 hours. Coagulation Profile: Recent Labs  Lab 09/02/19 1912  INR 1.0   Cardiac Enzymes: No results for input(s): CKTOTAL, CKMB, CKMBINDEX, TROPONINI in the last 168 hours. BNP (last 3 results) No results for input(s): PROBNP in the last 8760 hours. HbA1C: No results for input(s): HGBA1C in the last 72 hours. CBG: No results for input(s): GLUCAP in the last 168 hours. Lipid Profile: No results for input(s): CHOL, HDL, LDLCALC, TRIG, CHOLHDL, LDLDIRECT in the last 72 hours. Thyroid Function Tests: No results for input(s): TSH, T4TOTAL, FREET4, T3FREE, THYROIDAB in the last 72 hours. Anemia Panel: Recent Labs    09/03/19 0433  FERRITIN 234   Sepsis Labs: Recent Labs  Lab 09/03/19 0433  PROCALCITON <0.10    Recent Results (from the past 240 hour(s))  Culture, blood (x 2)     Status: None (Preliminary result)   Collection Time: 09/02/19  9:57 PM   Specimen: BLOOD RIGHT HAND  Result Value Ref Range Status   Specimen Description BLOOD RIGHT HAND  Final   Special Requests AEROBIC BOTTLE ONLY Blood Culture adequate volume  Final   Culture PENDING  Incomplete   Report Status PENDING  Incomplete         Radiology Studies: CT Angio Chest PE W and/or Wo Contrast  Result Date: 09/02/2019 CLINICAL DATA:  Shortness of breath. EXAM: CT ANGIOGRAPHY CHEST WITH CONTRAST TECHNIQUE: Multidetector CT imaging of the chest was performed using the standard protocol during bolus administration of intravenous contrast. Multiplanar CT image reconstructions and MIPs were obtained to evaluate the  vascular anatomy. CONTRAST:  158m OMNIPAQUE IOHEXOL 350 MG/ML SOLN COMPARISON:  None. FINDINGS: Cardiovascular: Satisfactory opacification of the pulmonary arteries to the segmental level. No evidence of pulmonary embolism. Normal heart size. No pericardial effusion. Mediastinum/Nodes: No enlarged mediastinal, hilar, or axillary lymph nodes. Thyroid gland, trachea, and esophagus demonstrate no significant findings. Lungs/Pleura: Moderate severity diffuse areas of atelectasis and/or infiltrate are seen bilaterally, slightly more prominent within the bilateral lung bases. There is no evidence of a pleural effusion or pneumothorax. Upper Abdomen: A 3.7 cm x 2.1 cm cystic appearing areas seen within the posteromedial aspect of the right lobe of the liver. Musculoskeletal: Multilevel degenerative changes seen throughout the thoracic spine. Review of  the MIP images confirms the above findings. IMPRESSION: 1. No evidence of acute pulmonary embolism. 2. Moderate severity diffuse areas of atelectasis and/or infiltrate bilaterally, slightly more prominent within the bilateral lung bases. 3. 3.7 cm cystic appearing lesion in the right lobe of the liver. Electronically Signed   By: Virgina Norfolk M.D.   On: 09/02/2019 21:28   DG Chest Port 1 View  Result Date: 09/02/2019 CLINICAL DATA:  Short of breath. EXAM: PORTABLE CHEST 1 VIEW COMPARISON:  None FINDINGS: Cardiac enlargement. Decreased lung volumes. Bilateral peripheral and lower lobe predominant opacities are identified. No pleural effusion or edema. IMPRESSION: Bilateral peripheral and lower lobe predominant opacities, which in the acute setting are concerning for multifocal pneumonia. Electronically Signed   By: Kerby Moors M.D.   On: 09/02/2019 19:51        Scheduled Meds: . albuterol  2 puff Inhalation Q6H  . benzonatate  200 mg Oral TID  . clonazePAM  1 mg Oral QHS  . dexamethasone  6 mg Oral Q24H  . enoxaparin (LOVENOX) injection  40 mg  Subcutaneous Q24H  . loratadine  10 mg Oral Daily  . potassium & sodium phosphates  1 packet Oral TID WC & HS  . sertraline  50 mg Oral Daily  . sodium chloride flush  3 mL Intravenous Q12H   Continuous Infusions: . [START ON 09/04/2019] remdesivir 100 mg in NS 100 mL       LOS: 1 day     Hosie Poisson, MD [facility]   To contact the attending provider between 7A-7P or the covering provider during after hours 7P-7A, please log into the web site [email or web address] and access using universal [place] password for that web site. If you do not have the password, please call the hospital operator.  09/03/2019, 3:06 PM

## 2019-09-03 NOTE — ED Notes (Signed)
Patient gave this nurse permission to speak with her daughter [name] at [phone].  Patient also gave permission to speak with her son Jonny Ruiz.

## 2019-09-03 NOTE — Progress Notes (Signed)
RT Note: BiPAP remains PRN.

## 2019-09-03 NOTE — ED Notes (Signed)
ziplock bag delivered to patient containing cell phone and charger.

## 2019-09-03 NOTE — ED Notes (Signed)
Contacted patient's daughter [name] and transferred [name] to patient phone in room.

## 2019-09-03 NOTE — ED Notes (Signed)
Writer provided update to patient's daughter, [name].

## 2019-09-03 NOTE — ED Notes (Signed)
Patient had soiled sheets/gown. NT and writer changed patients clothes and bed linens, cleaned patients bed with purple wipes. gave medications, provided lunch tray. Patient began coughing and requested liquid cough syrup. Notified attending. Placed patient phone at bedside.

## 2019-09-03 NOTE — ED Notes (Signed)
Skid resistant sock placed on patient for safety.

## 2019-09-03 NOTE — ED Notes (Signed)
Patient placed on hospital bed.

## 2019-09-04 DIAGNOSIS — J9601 Acute respiratory failure with hypoxia: Secondary | ICD-10-CM

## 2019-09-04 LAB — CBC WITH DIFFERENTIAL/PLATELET
Abs Immature Granulocytes: 0.1 10*3/uL — ABNORMAL HIGH (ref 0.00–0.07)
Basophils Absolute: 0 10*3/uL (ref 0.0–0.1)
Basophils Relative: 0 %
Eosinophils Absolute: 0 10*3/uL (ref 0.0–0.5)
Eosinophils Relative: 0 %
HCT: 27.7 % — ABNORMAL LOW (ref 36.0–46.0)
Hemoglobin: 8.5 g/dL — ABNORMAL LOW (ref 12.0–15.0)
Immature Granulocytes: 2 %
Lymphocytes Relative: 10 %
Lymphs Abs: 0.6 10*3/uL — ABNORMAL LOW (ref 0.7–4.0)
MCH: 32 pg (ref 26.0–34.0)
MCHC: 30.7 g/dL (ref 30.0–36.0)
MCV: 104.1 fL — ABNORMAL HIGH (ref 80.0–100.0)
Monocytes Absolute: 0.6 10*3/uL (ref 0.1–1.0)
Monocytes Relative: 9 %
Neutro Abs: 5 10*3/uL (ref 1.7–7.7)
Neutrophils Relative %: 79 %
Platelets: 203 10*3/uL (ref 150–400)
RBC: 2.66 MIL/uL — ABNORMAL LOW (ref 3.87–5.11)
RDW: 12.8 % (ref 11.5–15.5)
WBC: 6.3 10*3/uL (ref 4.0–10.5)
nRBC: 0 % (ref 0.0–0.2)

## 2019-09-04 LAB — COMPREHENSIVE METABOLIC PANEL
ALT: 61 U/L — ABNORMAL HIGH (ref 0–44)
AST: 31 U/L (ref 15–41)
Albumin: 3.2 g/dL — ABNORMAL LOW (ref 3.5–5.0)
Alkaline Phosphatase: 58 U/L (ref 38–126)
Anion gap: 10 (ref 5–15)
BUN: 16 mg/dL (ref 8–23)
CO2: 27 mmol/L (ref 22–32)
Calcium: 9.1 mg/dL (ref 8.9–10.3)
Chloride: 103 mmol/L (ref 98–111)
Creatinine, Ser: 0.61 mg/dL (ref 0.44–1.00)
GFR calc Af Amer: >60 mL/min (ref >60)
GFR calc non Af Amer: >60 mL/min (ref >60)
Glucose, Bld: 177 mg/dL — ABNORMAL HIGH (ref 70–99)
Potassium: 3.8 mmol/L (ref 3.5–5.1)
Sodium: 140 mmol/L (ref 135–145)
Total Bilirubin: 1.1 mg/dL (ref 0.3–1.2)
Total Protein: 7.5 g/dL (ref 6.5–8.1)

## 2019-09-04 LAB — PHOSPHORUS: Phosphorus: 3.2 mg/dL (ref 2.5–4.6)

## 2019-09-04 LAB — POC SARS CORONAVIRUS 2 AG -  ED: SARS Coronavirus 2 Ag: POSITIVE — AB

## 2019-09-04 LAB — HEMOGLOBIN AND HEMATOCRIT, BLOOD
HCT: 42.4 % (ref 36.0–46.0)
Hemoglobin: 13.4 g/dL (ref 12.0–15.0)

## 2019-09-04 LAB — FERRITIN: Ferritin: 276 ng/mL (ref 11–307)

## 2019-09-04 LAB — C-REACTIVE PROTEIN: CRP: 10.8 mg/dL — ABNORMAL HIGH (ref <1.0)

## 2019-09-04 LAB — D-DIMER, QUANTITATIVE: D-Dimer, Quant: 1.25 ug/mL-FEU — ABNORMAL HIGH (ref 0.00–0.50)

## 2019-09-04 LAB — MAGNESIUM: Magnesium: 2.3 mg/dL (ref 1.7–2.4)

## 2019-09-04 MED ORDER — TOCILIZUMAB 400 MG/20ML IV SOLN
730.0000 mg | Freq: Once | INTRAVENOUS | Status: AC
  Administered 2019-09-04: 730 mg via INTRAVENOUS
  Filled 2019-09-04: qty 36.5

## 2019-09-04 MED ORDER — ALBUTEROL SULFATE HFA 108 (90 BASE) MCG/ACT IN AERS
2.0000 | INHALATION_SPRAY | Freq: Three times a day (TID) | RESPIRATORY_TRACT | Status: DC
  Administered 2019-09-05 – 2019-09-10 (×16): 2 via RESPIRATORY_TRACT
  Filled 2019-09-04: qty 6.7

## 2019-09-04 MED ORDER — AMLODIPINE BESYLATE 5 MG PO TABS
5.0000 mg | ORAL_TABLET | Freq: Every day | ORAL | Status: DC
  Administered 2019-09-04 – 2019-09-05 (×2): 5 mg via ORAL
  Filled 2019-09-04 (×2): qty 1

## 2019-09-04 MED ORDER — CLONAZEPAM 0.5 MG PO TABS
1.0000 mg | ORAL_TABLET | Freq: Every day | ORAL | Status: DC | PRN
  Administered 2019-09-05: 1 mg via ORAL
  Filled 2019-09-04 (×2): qty 2

## 2019-09-04 MED ORDER — SALINE SPRAY 0.65 % NA SOLN
1.0000 | NASAL | Status: DC | PRN
  Administered 2019-09-04 – 2019-09-09 (×2): 1 via NASAL
  Filled 2019-09-04: qty 44

## 2019-09-04 NOTE — Progress Notes (Signed)
PROGRESS NOTE    Kristen Bender    Code Status: Full Code  CBU:[id number] DOB: February 26, 1956 DOA: 09/02/2019 LOS: 2 days  PCP: Bing Neighbors, FNP CC:  Chief Complaint  Patient presents with  . Shortness of Breath       Hospital Summary   This is a 64 year old Portuguese/Spanish-speaking female with past medical history of GERD, IBS, anxiety, depression who tested positive for COVID-19 2 weeks ago and presented to [facility] with worsening shortness of breath, chest pain and cough found to be hypoxic requiring up to 15 L/min HFNC in ED.  CXR with bilateral peripheral and lower lobe predominant opacities concerning for multifocal pneumonia.  CTA chest with moderate to severe diffuse areas of atelectasis and/or infiltrate bilaterally, more prominent in bilateral bases.  Started on IV remdesivir and Decadron and admitted to stepdown unit for further evaluation and management.  A & P   Active Problems:   Pneumonia due to COVID-19 virus   GERD (gastroesophageal reflux disease)   1. Acute hypoxic respiratory failure secondary to COVID-19 a. CTA chest with B/L opacity concerning for multifocal pneumonia b. Procalcitonin<0.10 and empiric CAP therapy discontinued c. Covid test from outside facility not available from 08/26/2019.  Will recheck COVID-19 to confirm d. Day 3/5 remdesivir, day 3/10 dexamethasone e. CRP 5.6-> 10.8 with elevated O2 requirements.  Therefore will give dose of Actemra today after risk/benefit discussion with patient f. Trend inflammatory markers g. Incentive spirometry, flutter valve and bronchodilators h. Proning as tolerated 2. Accelerated hypertension a. Still poorly controlled despite as needed hydralazine b. Start amlodipine 3. Leukocytosis resolved 4. Macrocytic anemia a. To be dropped from 12.3-> 8.5 overnight however on recheck Hb is 13 b. No signs/symptoms of bleeding c. FOBT d. Continue to monitor 5. Hyperglycemia without diabetes likely  steroid-induced 6. Hypophosphatemia resolved with supplement 7. Anxiety a. Takes clonazepam 1 mg nightly as home med b. Increased anxiety during hospitalization, add on additional clonazepam daily as needed and discontinue on discharge  DVT prophylaxis: Lovenox Family Communication: Patient's daughter has been updated.  States that patient requires [place] or Spanish-speaking interpreter Disposition Plan:   Patient came from:   Home                                                                                          Anticipated d/c place: Home  Barriers to d/c: Improved O2 requirements, likely discharge Sunday or Monday  Pressure injury documentation    None  Consultants  None  Procedures  none  Antibiotics   Anti-infectives (From admission, onward)   Start     Dose/Rate Route Frequency Ordered Stop   09/04/19 1000  remdesivir 100 mg in sodium chloride 0.9 % 100 mL IVPB  Status:  Discontinued     100 mg 200 mL/hr over 30 Minutes Intravenous Daily 09/03/19 0304 09/03/19 0307   09/04/19 1000  remdesivir 100 mg in sodium chloride 0.9 % 100 mL IVPB     10 0 mg 200 mL/hr over 30 Minutes Intravenous Daily 09/02/19 2304 09/08/19 0959   09/03/19 0315  remdesivir 200 mg in sodium chloride 0.9% 250 mL IVPB  Status:  Discontinued     200 mg 580 mL/hr over 30 Minutes Intravenous Once 09/03/19 0304 09/03/19 0307   09/03/19 0000  remdesivir 100 mg in sodium chloride 0.9 % 100 mL IVPB     100 mg 200 mL/hr over 30 Minutes Intravenous Every 1 hr x 2 09/02/19 2304 09/03/19 0154   09/02/19 2100  cefTRIAXone (ROCEPHIN) 2 g in sodium chloride 0.9 % 100 mL IVPB  Status:  Discontinued     2 g 200 mL/hr over 30 Minutes Intravenous Every 24 hours 09/02/19 2032 09/03/19 0304   09/02/19 2100  azithromycin (ZITHROMAX) 500 mg in sodium chloride 0.9 % 250 mL IVPB  Status:  Discontinued     500 mg 250 mL/hr over 60 Minutes Intravenous Every 24 hours 09/02/19 2032 09/03/19 0304         Subjective   Seen and examined at bedside in the ED in no acute distress resting comfortably.  Denies any signs/symptoms of blood loss such as GI bleed.  States that her breathing is improving.  Denies any other complaints at this time.  Objective   Vitals:   09/04/19 1030 09/04/19 1100 09/04/19 1300 09/04/19 1330  BP: (!) 162/98 (!) 159/86 (!) 120/93 (!) 140/95  Pulse: 74 77 77 75  Resp: (!) 31 (!) 29 (!) 25 (!) 23  Temp:      TempSrc:      SpO2: 90% 91% 93% 94%  Weight:      Height:       No intake or output data in the 24 hours ending 09/04/19 1356 Filed Weights   09/03/19 0314  Weight: 90.7 kg    Examination:  Physical Exam Vitals and nursing note reviewed.  Constitutional:      Appearance: Normal appearance.  HENT:     Head: Normocephalic and atraumatic.  Eyes:     Conjunctiva/sclera: Conjunctivae normal.  Cardiovascular:     Rate and Rhythm: Normal rate.     Pulses: Normal pulses.  Pulmonary:     Effort: Pulmonary effort is normal. No tachypnea or accessory muscle usage.  Abdominal:     General: Abdomen is flat.     Palpations: Abdomen is soft.  Musculoskeletal:        General: No swelling or tenderness.  Skin:    Coloration: Skin is not jaundiced or pale.  Neurological:     Mental Status: She is alert. Mental status is at baseline.  Psychiatric:        Mood and Affect: Mood normal.        Behavior: Behavior normal.     Data Reviewed: I have personally reviewed following labs and imaging studies  CBC: Recent Labs  Lab 09/02/19 1912 09/03/19 0427 09/04/19 0450 09/04/19 1103  WBC 13.7* 10.7* 6.3  --   NEUTROABS 11.6* 8.1* 5.0  --   HGB 14.8 12.3 8.5* 13.4  HCT 46.1* 40.5 27.7* 42.4  MCV 100.0 102.8* 104.1*  --   PLT 353 269 203  --    Basic Metabolic Panel: Recent Labs  Lab 09/02/19 1912 09/03/19 0427 09/03/19 0433 09/04/19 0450 09/04/19 1103  NA 140  --   --   --  140  K 3.7  --   --   --  3.8  CL 105  --   --   --  103   CO2 22  --   --   --  27  GLUCOSE 183*  --   --   --  177*  BUN 15  --   --   --  16  CREATININE 0.89  --  0.68  --  0.61  CALCIUM 9.5  --   --   --  9.1  MG  --  2.0  --  2.3  --   PHOS  --  2.4*  --  3.2  --    GFR: Estimated Creatinine Clearance: 77.5 mL/min (by C-G formula based on SCr of 0.61 mg/dL). Liver Function Tests: Recent Labs  Lab 09/02/19 1912 09/04/19 1103  AST 38 31  ALT 59* 61*  ALKPHOS 65 58  BILITOT 0.9 1.1  PROT 7.9 7.5  ALBUMIN 3.6 3.2*   No results for input(s): LIPASE, AMYLASE in the last 168 hours. No results for input(s): AMMONIA in the last 168 hours. Coagulation Profile: Recent Labs  Lab 09/02/19 1912  INR 1.0   Cardiac Enzymes: No results for input(s): CKTOTAL, CKMB, CKMBINDEX, TROPONINI in the last 168 hours. BNP (last 3 results) No results for input(s): PROBNP in the last 8760 hours. HbA1C: Recent Labs    09/03/19 0427  HGBA1C 5.2   CBG: No results for input(s): GLUCAP in the last 168 hours. Lipid Profile: No results for input(s): CHOL, HDL, LDLCALC, TRIG, CHOLHDL, LDLDIRECT in the last 72 hours. Thyroid Function Tests: No results for input(s): TSH, T4TOTAL, FREET4, T3FREE, THYROIDAB in the last 72 hours. Anemia Panel: Recent Labs    09/03/19 0433 09/04/19 0450  FERRITIN 234 276   Sepsis Labs: Recent Labs  Lab 09/03/19 0433  PROCALCITON <0.10    Recent Results (from the past 240 hour(s))  Culture, blood (x 2)     Status: None (Preliminary result)   Collection Time: 09/02/19  9:57 PM   Specimen: BLOOD  Result Value Ref Range Status   Specimen Description   Final    BLOOD LEFT ANTECUBITAL Performed at [facility], [place] [address] [place], [place] [address]    Special Requests   Final    BOTTLES DRAWN AEROBIC AND ANAEROBIC Blood Culture adequate volume Performed at [place] [address] [place], [place] [address]    Culture   Final    NO GROWTH 1 DAY Performed at  [facility], [place] [address] [place], [place] [address]    Report Status PENDING  Incomplete  Culture, blood (x 2)     Status: None (Preliminary result)   Collection Time: 09/02/19  9:57 PM   Specimen: BLOOD RIGHT HAND  Result Value Ref Range Status   Specimen Description BLOOD RIGHT HAND  Final   Special Requests AEROBIC BOTTLE ONLY Blood Culture adequate volume  Final   Culture   Final    NO GROWTH 1 DAY Performed at [facility], [place] [address] [place],  [address]    Report Status PENDING  Incomplete         Radiology Studies: CT Angio Chest PE W and/or Wo Contrast  Result Date: 09/02/2019 CLINICAL DATA:  Shortness of breath. EXAM: CT ANGIOGRAPHY CHEST WITH CONTRAST TECHNIQUE: Multidetector CT imaging of the chest was performed using the standard protocol during bolus administration of intravenous contrast. Multiplanar CT image reconstructions and MIPs were obtained to evaluate the vascular anatomy. CONTRAST:  133mL OMNIPAQUE IOHEXOL 350 MG/ML SOLN COMPARISON:  None. FINDINGS: Cardiovascular: Satisfactory opacification of the pulmonary arteries to the segmental level. No evidence of pulmonary embolism. Normal heart size. No pericardial effusion. Mediastinum/Nodes: No enlarged mediastinal, hilar, or axillary lymph nodes. Thyroid  gland, trachea, and esophagus demonstrate no significant findings. Lungs/Pleura: Moderate severity diffuse areas of atelectasis and/or infiltrate are seen bilaterally, slightly more prominent within the bilateral lung bases. There is no evidence of a pleural effusion or pneumothorax. Upper Abdomen: A 3.7 cm x 2.1 cm cystic appearing areas seen within the posteromedial aspect of the right lobe of the liver. Musculoskeletal: Multilevel degenerative changes seen throughout the thoracic spine. Review of the MIP images confirms the above findings. IMPRESSION: 1. No evidence of acute pulmonary embolism. 2. Moderate severity diffuse areas of atelectasis  and/or infiltrate bilaterally, slightly more prominent within the bilateral lung bases. 3. 3.7 cm cystic appearing lesion in the right lobe of the liver. Electronically Signed   By: Aram Candela M.D.   On: 09/02/2019 21:28   DG Chest Port 1 View  Result Date: 09/02/2019 CLINICAL DATA:  Short of breath. EXAM: PORTABLE CHEST 1 VIEW COMPARISON:  None FINDINGS: Cardiac enlargement. Decreased lung volumes. Bilateral peripheral and lower lobe predominant opacities are identified. No pleural effusion or edema. IMPRESSION: Bilateral peripheral and lower lobe predominant opacities, which in the acute setting are concerning for multifocal pneumonia. Electronically Signed   By: Signa Kell M.D.   On: 09/02/2019 19:51        Scheduled Meds: . albuterol  2 puff Inhalation Q6H  . amLODipine  5 mg Oral Daily  . benzonatate  200 mg Oral TID  . clonazePAM  1 mg Oral QHS  . dexamethasone  6 mg Oral Q24H  . enoxaparin (LOVENOX) injection  40 mg Subcutaneous Q24H  . loratadine  10 mg Oral Daily  . potassium & sodium phosphates  1 packet Oral TID WC & HS  . sertraline  50 mg Oral Daily  . sodium chloride flush  3 mL Intravenous Q12H   Continuous Infusions: . remdesivir 100 mg in NS 100 mL 100 mg (09/04/19 1116)     Time spent: 26 minutes with over 50% of the time coordinating the patient's care    Jae Dire, DO [facility] Pager [phone]  Call night coverage person covering after 7pm

## 2019-09-04 NOTE — ED Notes (Signed)
Please call pt's daughter [name] with updates [phone]

## 2019-09-05 LAB — D-DIMER, QUANTITATIVE: D-Dimer, Quant: 1.04 ug/mL-FEU — ABNORMAL HIGH (ref 0.00–0.50)

## 2019-09-05 LAB — CBC WITH DIFFERENTIAL/PLATELET
Abs Immature Granulocytes: 0.13 10*3/uL — ABNORMAL HIGH (ref 0.00–0.07)
Basophils Absolute: 0 10*3/uL (ref 0.0–0.1)
Basophils Relative: 1 %
Eosinophils Absolute: 0 10*3/uL (ref 0.0–0.5)
Eosinophils Relative: 0 %
HCT: 45 % (ref 36.0–46.0)
Hemoglobin: 14.2 g/dL (ref 12.0–15.0)
Immature Granulocytes: 2 %
Lymphocytes Relative: 25 %
Lymphs Abs: 1.7 10*3/uL (ref 0.7–4.0)
MCH: 32 pg (ref 26.0–34.0)
MCHC: 31.6 g/dL (ref 30.0–36.0)
MCV: 101.4 fL — ABNORMAL HIGH (ref 80.0–100.0)
Monocytes Absolute: 0.4 10*3/uL (ref 0.1–1.0)
Monocytes Relative: 7 %
Neutro Abs: 4.4 10*3/uL (ref 1.7–7.7)
Neutrophils Relative %: 65 %
Platelets: 217 10*3/uL (ref 150–400)
RBC: 4.44 MIL/uL (ref 3.87–5.11)
RDW: 12.6 % (ref 11.5–15.5)
WBC: 6.7 10*3/uL (ref 4.0–10.5)
nRBC: 0 % (ref 0.0–0.2)

## 2019-09-05 LAB — BASIC METABOLIC PANEL
Anion gap: 8 (ref 5–15)
BUN: 20 mg/dL (ref 8–23)
CO2: 29 mmol/L (ref 22–32)
Calcium: 8.8 mg/dL — ABNORMAL LOW (ref 8.9–10.3)
Chloride: 104 mmol/L (ref 98–111)
Creatinine, Ser: 0.65 mg/dL (ref 0.44–1.00)
GFR calc Af Amer: >60 mL/min (ref >60)
GFR calc non Af Amer: >60 mL/min (ref >60)
Glucose, Bld: 103 mg/dL — ABNORMAL HIGH (ref 70–99)
Potassium: 3.7 mmol/L (ref 3.5–5.1)
Sodium: 141 mmol/L (ref 135–145)

## 2019-09-05 LAB — C-REACTIVE PROTEIN: CRP: 7.1 mg/dL — ABNORMAL HIGH (ref <1.0)

## 2019-09-05 LAB — PHOSPHORUS: Phosphorus: 3.7 mg/dL (ref 2.5–4.6)

## 2019-09-05 LAB — FERRITIN: Ferritin: 330 ng/mL — ABNORMAL HIGH (ref 11–307)

## 2019-09-05 LAB — MAGNESIUM: Magnesium: 2.5 mg/dL — ABNORMAL HIGH (ref 1.7–2.4)

## 2019-09-05 MED ORDER — LORAZEPAM 2 MG/ML IJ SOLN
1.0000 mg | Freq: Every day | INTRAMUSCULAR | Status: DC | PRN
  Administered 2019-09-05 – 2019-09-07 (×4): 1 mg via INTRAVENOUS
  Filled 2019-09-05 (×4): qty 1

## 2019-09-05 MED ORDER — AMLODIPINE BESYLATE 10 MG PO TABS
10.0000 mg | ORAL_TABLET | Freq: Every day | ORAL | Status: DC
  Administered 2019-09-06 – 2019-09-10 (×5): 10 mg via ORAL
  Filled 2019-09-05 (×5): qty 1

## 2019-09-05 MED ORDER — PROSIGHT PO TABS
1.0000 | ORAL_TABLET | Freq: Every day | ORAL | Status: DC
  Administered 2019-09-06 – 2019-09-10 (×5): 1 via ORAL
  Filled 2019-09-05 (×5): qty 1

## 2019-09-05 NOTE — ED Notes (Signed)
Patient given a lunch tray. When pt takes of non-rebreather to eat lunch and only has the [place] in, oxygen sats drop to high 70s-mid 80s. Patient compliant and verbalizes understanding and importance of replacing non-rebreather once she is done with her meal. Will continue to monitor.

## 2019-09-05 NOTE — Progress Notes (Signed)
Patient arrived to unit

## 2019-09-05 NOTE — Progress Notes (Signed)
PROGRESS NOTE    Kristen Bender    Code Status: Full Code  PPJ:[id number] DOB: 1955-12-28 DOA: 09/02/2019 LOS: 3 days  PCP: Bing Neighbors, FNP CC:  Chief Complaint  Patient presents with  . Shortness of Breath       Hospital Summary   This is a 64 year old Portuguese/Spanish-speaking female with past medical history of GERD, IBS, anxiety, depression who tested positive for COVID-19 2 weeks ago and presented to [facility] with worsening shortness of breath, chest pain and cough found to be hypoxic requiring up to 15 L/min HFNC in ED.  CXR with bilateral peripheral and lower lobe predominant opacities concerning for multifocal pneumonia.  CTA chest with moderate to severe diffuse areas of atelectasis and/or infiltrate bilaterally, more prominent in bilateral bases.  Started on IV remdesivir and Decadron and admitted to stepdown unit for further evaluation and management.  3/18: Transfer to [facility] for bed availability  A & P   Active Problems:   Pneumonia due to COVID-19 virus   GERD (gastroesophageal reflux disease)    1. Acute hypoxic respiratory failure secondary to COVID-19 a. CTA chest with B/L opacity concerning for multifocal pneumonia b. Procalcitonin<0.10 and empiric CAP therapy discontinued c. Covid test from outside facility not available from 08/26/2019.  Will recheck COVID-19 to confirm d. Day 4/5 remdesivir, day 4/10 dexamethasone, Actemra x1 e. Improved CRP, still increased O2 requirements, symptomatically improved f. Trend inflammatory markers g. Incentive spirometry, flutter valve and bronchodilators h. Proning as tolerated i. Transfer to [facility] stepdown due to bed availability 2. Accelerated hypertension a. Still poorly controlled despite as needed hydralazine b. Started amlodipine this admission, will increase 3. Leukocytosis resolved 4. Macrocytic anemia a. Initially dropped from 12.3-> 8.5 overnight however on recheck Hb is 13 and stable at 14 today.  8.5 was likely  false b. No signs/symptoms of bleeding c. Continue to monitor 5. Hyperglycemia without diabetes likely steroid-induced 6. Hypophosphatemia resolved with supplement 7. Anxiety a. Takes clonazepam 1 mg nightly as home med b. Increased anxiety during hospitalization, add on additional ativan daily as needed and discontinue on discharge  DVT prophylaxis: Lovenox Family Communication: Patient's daughter has been updated. call pt's daughter [name] with updates [phone].  patient requires [place] or Spanish-speaking interpreter Disposition Plan:   Patient came from:   Home                                                                                          Anticipated d/c place: Home  Barriers to d/c: Improved O2 requirements, likely discharge Monday  Pressure injury documentation    None  Consultants  None  Procedures  none  Antibiotics   Anti-infectives (From admission, onward)   Start     Dose/Rate Route Frequency Ordered Stop   09/04/19 1000  remdesivir 100 mg in sodium chloride 0.9 % 100 mL IVPB  Status:  Discontinued     100 mg 200 mL/hr over 30 Minutes Intravenous Daily 09/03/19 0304 09/03/19 0307   09/04/19 1000  remdesivir 100 mg in sodium chloride 0.9 % 100 mL IVPB     100 mg 200 mL/hr over 30 Minutes Intravenous Daily  09/02/19 2304 09/08/19 0959   09/03/19 0315  remdesivir 200 mg in sodium chloride 0.9% 250 mL IVPB  Status:  Discontinued     200 mg 580 mL/hr over 30 Minutes Intravenous Once 09/03/19 0304 09/03/19 0307   09/03/19 0000  remdesivir 100 mg in sodium chloride 0.9 % 100 mL IVPB     100 mg 200 mL/hr over 30 Minutes Intravenous Every 1 hr x 2 09/02/19 2304 09/03/19 0154   09/02/19 2100  cefTRIAXone (ROCEPHIN) 2 g in sodium chloride 0.9 % 100 mL IVPB  Status:  Discontinued     2 g 200 mL/hr over 30 Minutes Intravenous Every 24 hours 09/02/19 2032 09/03/19 0304   09/02/19 2100  azithromycin (ZITHROMAX) 500 mg in sodium chloride 0.9 % 250 mL IVPB   Status:  Discontinued     500 mg 250 mL/hr over 60 Minutes Intravenous Every 24 hours 09/02/19 2032 09/03/19 0304        Subjective   Patient examined at bedside in the ED in no acute distress resting comfortably.  Appreciated the help of a Spanish interpreter via iPad.  Currently states that she is feeling improved from yesterday but still short of breath on exertion.  Having anxiety as well in the morning.  No chest pain, fever, diarrhea or any other complaints.  Objective   Vitals:   09/05/19 1200 09/05/19 1231 09/05/19 1341 09/05/19 1356  BP: 109/84   (!) 143/80  Pulse: 65 78 75 85  Resp: (!) 28 (!) 30 (!) 25 (!) 31  Temp:      TempSrc:      SpO2: 93% 96% 90% (!) 87%  Weight:      Height:        Intake/Output Summary (Last 24 hours) at 09/05/2019 1406 Last data filed at 09/05/2019 1400 Gross per 24 hour  Intake 100 ml  Output --  Net 100 ml   Filed Weights   09/03/19 0314  Weight: 90.7 kg    Examination:  Physical Exam Vitals and nursing note reviewed.  Constitutional:      General: She is not in acute distress.    Appearance: Normal appearance.  HENT:     Head: Normocephalic and atraumatic.  Eyes:     Conjunctiva/sclera: Conjunctivae normal.  Cardiovascular:     Rate and Rhythm: Normal rate and regular rhythm.  Pulmonary:     Effort: Pulmonary effort is normal. No accessory muscle usage.  Abdominal:     General: Abdomen is flat.     Palpations: Abdomen is soft.  Musculoskeletal:        General: No swelling or tenderness.     Right lower leg: No tenderness. No edema.     Left lower leg: No tenderness. No edema.  Skin:    Coloration: Skin is not jaundiced or pale.  Neurological:     Mental Status: She is alert. Mental status is at baseline.  Psychiatric:        Mood and Affect: Mood is anxious.        Behavior: Behavior normal.     Data Reviewed: I have personally reviewed following labs and imaging studies  CBC: Recent Labs  Lab  09/02/19 1912 09/03/19 0427 09/04/19 0450 09/04/19 1103 09/05/19 0428  WBC 13.7* 10.7* 6.3  --  6.7  NEUTROABS 11.6* 8.1* 5.0  --  4.4  HGB 14.8 12.3 8.5* 13.4 14.2  HCT 46.1* 40.5 27.7* 42.4 45.0  MCV 100.0 102.8* 104.1*  --  101.4*  PLT 353 269 203  --  217   Basic Metabolic Panel: Recent Labs  Lab 09/02/19 1912 09/03/19 0427 09/03/19 0433 09/04/19 0450 09/04/19 1103 09/05/19 0428  NA 140  --   --   --  140 141  K 3.7  --   --   --  3.8 3.7  CL 105  --   --   --  103 104  CO2 22  --   --   --  27 29  GLUCOSE 183*  --   --   --  177* 103*  BUN 15  --   --   --  16 20  CREATININE 0.89  --  0.68  --  0.61 0.65  CALCIUM 9.5  --   --   --  9.1 8.8*  MG  --  2.0  --  2.3  --  2.5*  PHOS  --  2.4*  --  3.2  --  3.7   GFR: Estimated Creatinine Clearance: 77.5 mL/min (by C-G formula based on SCr of 0.65 mg/dL). Liver Function Tests: Recent Labs  Lab 09/02/19 1912 09/04/19 1103  AST 38 31  ALT 59* 61*  ALKPHOS 65 58  BILITOT 0.9 1.1  PROT 7.9 7.5  ALBUMIN 3.6 3.2*   No results for input(s): LIPASE, AMYLASE in the last 168 hours. No results for input(s): AMMONIA in the last 168 hours. Coagulation Profile: Recent Labs  Lab 09/02/19 1912  INR 1.0   Cardiac Enzymes: No results for input(s): CKTOTAL, CKMB, CKMBINDEX, TROPONINI in the last 168 hours. BNP (last 3 results) No results for input(s): PROBNP in the last 8760 hours. HbA1C: Recent Labs    09/03/19 0427  HGBA1C 5.2   CBG: No results for input(s): GLUCAP in the last 168 hours. Lipid Profile: No results for input(s): CHOL, HDL, LDLCALC, TRIG, CHOLHDL, LDLDIRECT in the last 72 hours. Thyroid Function Tests: No results for input(s): TSH, T4TOTAL, FREET4, T3FREE, THYROIDAB in the last 72 hours. Anemia Panel: Recent Labs    09/04/19 0450 09/05/19 0428  FERRITIN 276 330*   Sepsis Labs: Recent Labs  Lab 09/03/19 0433  PROCALCITON <0.10    Recent Results (from the past 240 hour(s))  Culture,  blood (x 2)     Status: None (Preliminary result)   Collection Time: 09/02/19  9:57 PM   Specimen: BLOOD  Result Value Ref Range Status   Specimen Description   Final    BLOOD LEFT ANTECUBITAL Performed at [facility], [address] [place], [place] [address]    Special Requests   Final    BOTTLES DRAWN AEROBIC AND ANAEROBIC Blood Culture adequate volume Performed at [facility], [address] [place], [place] [address]    Culture   Final    NO GROWTH 2 DAYS Performed at [facility], [address] [place], [place] [address]    Report Status PENDING  Incomplete  Culture, blood (x 2)     Status: None (Preliminary result)   Collection Time: 09/02/19  9:57 PM   Specimen: BLOOD RIGHT HAND  Result Value Ref Range Status   Specimen Description BLOOD RIGHT HAND  Final   Special Requests AEROBIC BOTTLE ONLY Blood Culture adequate volume  Final   Culture   Final    NO GROWTH 2 DAYS Performed at [facility], [address], [place], [place] [address]    Report Status PENDING  Incomplete  Radiology Studies: No results found.      Scheduled Meds: . albuterol  2 puff Inhalation TID  . amLODipine  5 mg Oral Daily  . benzonatate  200 mg Oral TID  . clonazePAM  1 mg Oral QHS  . dexamethasone  6 mg Oral Q24H  . enoxaparin (LOVENOX) injection  40 mg Subcutaneous Q24H  . loratadine  10 mg Oral Daily  . potassium & sodium phosphates  1 packet Oral TID WC & HS  . sertraline  50 mg Oral Daily  . sodium chloride flush  3 mL Intravenous Q12H   Continuous Infusions: . remdesivir 100 mg in NS 100 mL Stopped (09/05/19 1400)     Time spent: 30 minutes with over 50% of the time coordinating the patient's care    Harold Hedge, DO [facility] Pager [phone]  Call night coverage person covering after 7pm

## 2019-09-05 NOTE — Progress Notes (Signed)
Updated pt daughter Kristen Bender

## 2019-09-06 ENCOUNTER — Inpatient Hospital Stay (HOSPITAL_COMMUNITY): Payer: HRSA Program

## 2019-09-06 DIAGNOSIS — R0602 Shortness of breath: Secondary | ICD-10-CM

## 2019-09-06 LAB — CBC
HCT: 44.7 % (ref 36.0–46.0)
Hemoglobin: 14.5 g/dL (ref 12.0–15.0)
MCH: 32 pg (ref 26.0–34.0)
MCHC: 32.4 g/dL (ref 30.0–36.0)
MCV: 98.7 fL (ref 80.0–100.0)
Platelets: 392 10*3/uL (ref 150–400)
RBC: 4.53 MIL/uL (ref 3.87–5.11)
RDW: 12.5 % (ref 11.5–15.5)
WBC: 9 10*3/uL (ref 4.0–10.5)
nRBC: 0 % (ref 0.0–0.2)

## 2019-09-06 LAB — BASIC METABOLIC PANEL
Anion gap: 15 (ref 5–15)
BUN: 17 mg/dL (ref 8–23)
CO2: 24 mmol/L (ref 22–32)
Calcium: 9.3 mg/dL (ref 8.9–10.3)
Chloride: 99 mmol/L (ref 98–111)
Creatinine, Ser: 0.67 mg/dL (ref 0.44–1.00)
GFR calc Af Amer: >60 mL/min (ref >60)
GFR calc non Af Amer: >60 mL/min (ref >60)
Glucose, Bld: 137 mg/dL — ABNORMAL HIGH (ref 70–99)
Potassium: 4.3 mmol/L (ref 3.5–5.1)
Sodium: 138 mmol/L (ref 135–145)

## 2019-09-06 LAB — D-DIMER, QUANTITATIVE: D-Dimer, Quant: 0.9 ug/mL-FEU — ABNORMAL HIGH (ref 0.00–0.50)

## 2019-09-06 LAB — MAGNESIUM: Magnesium: 2.3 mg/dL (ref 1.7–2.4)

## 2019-09-06 LAB — BRAIN NATRIURETIC PEPTIDE: B Natriuretic Peptide: 46.8 pg/mL (ref 0.0–100.0)

## 2019-09-06 LAB — C-REACTIVE PROTEIN: CRP: 3 mg/dL — ABNORMAL HIGH (ref <1.0)

## 2019-09-06 MED ORDER — CLONAZEPAM 0.25 MG PO TBDP
0.5000 mg | ORAL_TABLET | Freq: Two times a day (BID) | ORAL | Status: DC
  Administered 2019-09-06 – 2019-09-07 (×4): 0.5 mg via ORAL
  Filled 2019-09-06 (×5): qty 2

## 2019-09-06 NOTE — Progress Notes (Signed)
Occupational Therapy Evaluation Patient Details Name: Kristen Bender MRN: [id number] DOB: Aug 22, 1955 Today's Date: 09/06/2019    History of Present Illness 64 y.o. female Spanish speaking with medical history significant of IBS, GERD, anxiety, depression, allergies who presents for 1 day of SOB.  She notes that she developed symptoms including cough about 2 weeks ago.  She was tested and resulted positive for COVID 19 8 days ago per report in the ED. Chest x-ray showsBilateral peripheral and lower lobe predominant opacities, which in the acute setting are concerning for multifocal pneumonia. Admitted 09/02/19.   Clinical Impression   PTA, pt lives with husband in first floor apartment. Pt reports Independence with ADLs and mobility without AD, shares IADLs with spouse. Presently, pt received on 15 L HFNC and 15 L NRB mask, stats at 88-92% at rest in recliner. On initiation of movement, scooting/leaning forward in chair with removal of NRB mask, pt experienced extended coughing fit, desats to 79%. Encouraged holding pillow to ease reported pain when coughing. Pt became very anxious, reaching for NRB mask (reluctant to let OT adjust appropriately on face) and increased RR (HR WFL). Provided cues for relaxation strategies and pursed lip breathing with pt recovering and sustained in low 90s (within 2 minutes) with 15 L + 15 L. However, pt continued to be anxious, requiring max encouragement for one sit to stand attempt in which pt completed min guard without AD. Based on severe decreased cardiopulmonary tolerance for ADLs, recommend SNF for short term rehab at this time. Consider CIR when pt closer to end of 20-day COVID-19 quarantine. Will continue to follow acutely.     Follow Up Recommendations  SNF;Supervision/Assistance - 24 hour(Consider CIR when closer to end of COVID quarantine )    Equipment Recommendations  3 in 1 bedside commode    Recommendations for Other Services       Precautions /  Restrictions Precautions Precautions: Fall;Other (comment)(airborne, anxiety) Restrictions Weight Bearing Restrictions: No      Mobility Bed Mobility               General bed mobility comments: pt in recliner on OT entry  Transfers Overall transfer level: Needs assistance Equipment used: None Transfers: Sit to/from Stand Sit to Stand: Min guard         General transfer comment: maximal encouragement needed for completion of one sit to stand. Pt able to complete without AD, min guard for safety    Balance Overall balance assessment: Needs assistance Sitting-balance support: Feet supported;Bilateral upper extremity supported;Single extremity supported Sitting balance-Leahy Scale: Good     Standing balance support: No upper extremity supported Standing balance-Leahy Scale: Fair                             ADL either performed or assessed with clinical judgement   ADL Overall ADL's : Needs assistance/impaired Eating/Feeding: Set up;Sitting Eating/Feeding Details (indicate cue type and reason): Setup to drink from cup. Will need cues for calm, safe alternation between sips/bites and NRB mask mgmt Grooming: Set up;Wash/dry face   Upper Body Bathing: Moderate assistance;Sitting   Lower Body Bathing: Moderate assistance;Sit to/from stand;Sitting/lateral leans   Upper Body Dressing : Moderate assistance;Sitting   Lower Body Dressing: Moderate assistance;Sit to/from stand;Sitting/lateral leans     Toilet Transfer Details (indicate cue type and reason): Unable to assess, suspect limited assist  Toileting- Clothing Manipulation and Hygiene: Maximal assistance;Sit to/from stand;Sitting/lateral lean  General ADL Comments: Pt limited by severely decreased cardiopulmonary tolerance, increasing anxiety with exertion/coughing impacting ability to complete ADLs      Vision Baseline Vision/History: Wears glasses       Perception     Praxis       Pertinent Vitals/Pain Pain Assessment: Faces Faces Pain Scale: Hurts even more Pain Location: chest/abdomen due to coughing Pain Descriptors / Indicators: Burning;Discomfort;Grimacing;Guarding;Moaning Pain Intervention(s): Limited activity within patient's tolerance;Monitored during session;Repositioned;Utilized relaxation techniques;Other (comment)(encouraged holding pillow when coughing )     Hand Dominance Right   Extremity/Trunk Assessment Upper Extremity Assessment Upper Extremity Assessment: Overall WFL for tasks assessed   Lower Extremity Assessment Lower Extremity Assessment: Defer to PT evaluation       Communication Communication Communication: No difficulties;Other (comment)(Spanish is primary language, but able to communicate [place])   Cognition Arousal/Alertness: Awake/alert Behavior During Therapy: Anxious;WFL for tasks assessed/performed Overall Cognitive Status: Within Functional Limits for tasks assessed                                 General Comments: Pt very anxious when coughing, experiencing SOB though O2 stats normalized    General Comments  Pt received on 15 L HFNC, 15 L NRB mask at 88-92% at rest. removed NRB mask, pt desat to 79% while sitting unsupported in recliner chair. With addition of NRB mask, pt increased to 90s within 2 minutes, but demo increased RR and high anxiety limited performance of further activities during evaluation despite being at 94% O2.     Exercises Exercises: Other exercises Other Exercises Other Exercises: pursed lip breathing    Shoulder Instructions      Home Living Family/patient expects to be discharged to:: Private residence Living Arrangements: Spouse/significant other Available Help at Discharge: Family Type of Home: Apartment Home Access: Level entry     Home Layout: One level     Bathroom Shower/Tub: Chief Strategy Officer: Standard     Home Equipment: None           Prior Functioning/Environment Level of Independence: Independent        Comments: Pt Independent with ADLs, mobility with no AD. Pt and husband share IADLs        OT Problem List: Decreased activity tolerance;Decreased knowledge of precautions;Cardiopulmonary status limiting activity      OT Treatment/Interventions: Self-care/ADL training;Therapeutic exercise;Energy conservation;DME and/or AE instruction;Therapeutic activities;Patient/family education    OT Goals(Current goals can be found in the care plan section) Acute Rehab OT Goals Patient Stated Goal: decrease anxiety OT Goal Formulation: With patient Time For Goal Achievement: 09/20/19 Potential to Achieve Goals: Good ADL Goals Pt Will Perform Grooming: with supervision;standing Pt Will Perform Upper Body Bathing: with supervision;sitting Pt Will Perform Lower Body Bathing: with min guard assist;sit to/from stand Pt Will Transfer to Toilet: with supervision;stand pivot transfer;bedside commode Pt Will Perform Toileting - Clothing Manipulation and hygiene: with supervision;sit to/from stand Additional ADL Goal #1: Pt to demonstrate implementation of 3 energy conservation strategies during ADLs in order to maximize independence  OT Frequency: Min 3X/week   Barriers to D/C:            Co-evaluation              AM-PAC OT "6 Clicks" Daily Activity     Outcome Measure Help from another person eating meals?: A Little Help from another person taking care of personal grooming?: A Little Help from another  person toileting, which includes using toliet, bedpan, or urinal?: A Lot Help from another person bathing (including washing, rinsing, drying)?: A Lot Help from another person to put on and taking off regular upper body clothing?: A Lot Help from another person to put on and taking off regular lower body clothing?: A Lot 6 Click Score: 14   End of Session Equipment Utilized During Treatment: Oxygen Nurse  Communication: Mobility status;Other (comment)(anxiety, IV line issues )  Activity Tolerance: Patient limited by fatigue;Other (comment)(limited by anxiety) Patient left: in chair;with call bell/phone within reach  OT Visit Diagnosis: Unsteadiness on feet (R26.81);Other (comment)(Decreased cardiopulmonary tolerance )                Time: 8657-8469 OT Time Calculation (min): 34 min Charges:  OT General Charges $OT Visit: 1 Visit OT Evaluation $OT Eval Moderate Complexity: 1 Mod OT Treatments $Therapeutic Activity: 8-22 mins  Layla Maw, OTR/L  Layla Maw 09/06/2019, 1:50 PM

## 2019-09-06 NOTE — Evaluation (Signed)
Physical Therapy Evaluation Patient Details Name: Kristen Bender MRN: [id number] DOB: Nov 06, 1955 Today's Date: 09/06/2019   History of Present Illness  64 y.o. female Spanish speaking with medical history significant of IBS, GERD, anxiety, depression, allergies who presents for 1 day of SOB.  She notes that she developed symptoms including cough about 2 weeks ago.  She was tested and resulted positive for COVID 19 8 days ago per report in the ED. Chest x-ray showsBilateral peripheral and lower lobe predominant opacities, which in the acute setting are concerning for multifocal pneumonia. Admitted 09/02/19.  Clinical Impression  PTA pt living with husband in single level apartment with 15 steps to enter. Pt reports on 08/26/19 she was completely independent with ambulation and iADLs. Evaluation limited as pt reports just getting back to bed after sitting up most of the day. Pt is on 15L O2 via HFNC with SaO2 93%O2. Pt agreeable to sit EoB and requires supervision to sit EoB.however SaO2 drops to 83%O2 and pt requires maximal cuing for pursed lipped breathing to recover to 90%O2. While EoB pt able to perform LE exercise which again caused O2 desaturation and cues for breathing. Pt request to return to bed. Pt phone alarm went off as she was returning to bed and she reported it was time to do her IS. Pt educated in proper use and able to perform x10 with max inspiration 250 mL. PT recommends [facility]PT level rehab for strength and endurance. PT will continue to follow acutely.    Follow Up Recommendations Home health PT;Supervision/Assistance - 24 hour    Equipment Recommendations  Rolling walker with 5" wheels    Recommendations for Other Services       Precautions / Restrictions Precautions Precautions: Fall;Other (comment)(airborne, anxiety) Restrictions Weight Bearing Restrictions: No      Mobility  Bed Mobility Overal bed mobility: Needs Assistance Bed Mobility: Supine to Sit;Sit to Supine      Supine to sit: Supervision Sit to supine: Supervision   General bed mobility comments: supervision for coming to EoB with use of hand rails   Transfers                 General transfer comment: refuses as she has been up in the chair for extended time and does not want to start coughing again                    Balance Overall balance assessment: Needs assistance Sitting-balance support: Feet supported;Bilateral upper extremity supported;Single extremity supported Sitting balance-Leahy Scale: Good     Standing balance support: No upper extremity supported Standing balance-Leahy Scale: Fair                               Pertinent Vitals/Pain Pain Assessment: Faces Faces Pain Scale: Hurts even more Pain Location: chest/abdomen due to coughing Pain Descriptors / Indicators: Burning;Discomfort;Grimacing;Guarding;Moaning    Home Living Family/patient expects to be discharged to:: Private residence Living Arrangements: Spouse/significant other Available Help at Discharge: Family Type of Home: Apartment Home Access: Stairs to enter   Technical brewer of Steps: 15 Home Layout: One level Home Equipment: None      Prior Function Level of Independence: Independent         Comments: Pt Independent with ADLs, mobility with no AD. Pt and husband share IADLs     Hand Dominance   Dominant Hand: Right    Extremity/Trunk Assessment   Upper Extremity Assessment  Upper Extremity Assessment: Defer to OT evaluation    Lower Extremity Assessment Lower Extremity Assessment: RLE deficits/detail;LLE deficits/detail RLE Deficits / Details: ROM WFL, strength grossly 3+/5 RLE Coordination: WNL LLE Deficits / Details: ROM WFL, strength grossly 3+/5 LLE Coordination: WNL       Communication   Communication: No difficulties;Other (comment)(Spanish is primary language, but able to communicate [place])  Cognition Arousal/Alertness:  Awake/alert Behavior During Therapy: Anxious;WFL for tasks assessed/performed Overall Cognitive Status: Within Functional Limits for tasks assessed                                 General Comments: Pt very anxious when coughing, experiencing SOB though O2 stats normalized       General Comments General comments (skin integrity, edema, etc.): Pt on 15 L O2 via HFNC on entry with SaO2 93%O2, with movement to EoB decreased to 83%O2 and requires maximal verbal cuing for pursed lipped breathing to recover to 90%O2, desatted with ROM of LE to 86%O2,     Exercises General Exercises - Lower Extremity Ankle Circles/Pumps: AROM;Both;10 reps;Seated Long Arc Quad: AROM;Both;5 reps;Seated Hip ABduction/ADduction: AROM;Both;Seated;5 reps Hip Flexion/Marching: AROM;Both;5 reps;Seated Other Exercises Other Exercises: pursed lip breathing  Other Exercises: ISx10 max inspiration 250 mL   Assessment/Plan    PT Assessment Patient needs continued PT services  PT Problem List Decreased strength;Decreased activity tolerance;Decreased mobility;Cardiopulmonary status limiting activity;Decreased safety awareness       PT Treatment Interventions DME instruction;Gait training;Stair training;Functional mobility training;Therapeutic activities;Therapeutic exercise;Balance training;Cognitive remediation;Patient/family education    PT Goals (Current goals can be found in the Care Plan section)  Acute Rehab PT Goals Patient Stated Goal: decrease anxiety PT Goal Formulation: With patient Time For Goal Achievement: 09/20/19 Potential to Achieve Goals: Good    Frequency Min 3X/week   Barriers to discharge Inaccessible home environment         AM-PAC PT "6 Clicks" Mobility  Outcome Measure Help needed turning from your back to your side while in a flat bed without using bedrails?: A Little Help needed moving from lying on your back to sitting on the side of a flat bed without using bedrails?:  A Little Help needed moving to and from a bed to a chair (including a wheelchair)?: A Little Help needed standing up from a chair using your arms (e.g., wheelchair or bedside chair)?: A Little Help needed to walk in hospital room?: A Lot Help needed climbing 3-5 steps with a railing? : Total 6 Click Score: 15    End of Session Equipment Utilized During Treatment: Oxygen Activity Tolerance: Other (comment)(limited by O2 desaturation and assoc. anxiety) Patient left: in bed;with call bell/phone within reach;with bed alarm set Nurse Communication: Mobility status PT Visit Diagnosis: Muscle weakness (generalized) (M62.81);Difficulty in walking, not elsewhere classified (R26.2)    Time: 4403-4742 PT Time Calculation (min) (ACUTE ONLY): 14 min   Charges:   PT Evaluation $PT Eval Moderate Complexity: 1 Mod          Muscab Brenneman B. Beverely Risen PT, DPT Acute Rehabilitation Services Pager [phone] Office [phone]   Elon Alas Fleet 09/06/2019, 5:42 PM

## 2019-09-06 NOTE — Progress Notes (Signed)
PROGRESS NOTE                                                                                                                                                                                                             Patient Demographics:    Kristen Bender, is a 64 y.o. female, DOB - [birth year], TDV:[id number]  Outpatient Primary MD for the patient is Scot Jun, FNP    LOS - 4  Admit date - 09/02/2019    Chief Complaint  Patient presents with   Shortness of Breath       Brief Narrative - This is a 64 year old Portuguese/Spanish-speaking female with past medical history of GERD, IBS, anxiety, depression who tested positive for COVID-19 2 weeks ago and presented to [facility] with worsening shortness of breath, she was diagnosed with severe acute hypoxic respiratory failure due to COVID-19 pneumonia.  He was placed on high flow nasal cannula oxygen along with nonrebreather mask, she received IV steroids, remdesivir and Actemra at [facility] hospital and was transferred to [facility] on 09/05/2019 for further care.   Subjective:    Kristen Bender today has, No headache, No chest pain, No abdominal pain - No Nausea, No new weakness tingling or numbness, improving shortness of breath.   Assessment  & Plan :    Active Problems:   Pneumonia due to COVID-19 virus   GERD (gastroesophageal reflux disease)   1. Acute Hypoxic Resp. Failure due to Acute Covid 19 Viral Pneumonitis during the ongoing 2020 Covid 19 Pandemic - she has severe disease, currently on high flow nasal cannula oxygen along with nonrebreather mask, she has already received appropriate treatment with IV steroids, remdesivir and Actemra, continue steroids, encourage to advance activity and titrate down oxygen gradually as tolerated.  She is still quite sick but seems to be making some progress.  Encouraged the patient to sit up in chair in the daytime use I-S  and flutter valve for pulmonary toiletry and then prone in bed when at night.    SpO2: 93 % O2 Flow Rate (L/min): 15 L/min  Recent Labs  Lab 09/02/19 1913 09/03/19 0427 09/03/19 0433 09/04/19 0450 09/05/19 0428 09/06/19 0849  CRP  --   --  5.6* 10.8* 7.1* 3.0*  DDIMER  --  0.86*  --  1.25* 1.04* 0.90*  BNP 80.7  --   --   --   --  46.8  PROCALCITON  --   --  <0.10  --   --   --     Hepatic Function Latest Ref Rng & Units 09/04/2019 09/02/2019  Total Protein 6.5 - 8.1 g/dL 7.5 7.9  Albumin 3.5 - 5.0 g/dL 3.2(L) 3.6  AST 15 - 41 U/L 31 38  ALT 0 - 44 U/L 61(H) 59(H)  Alk Phosphatase 38 - 126 U/L 58 65  Total Bilirubin 0.3 - 1.2 mg/dL 1.1 0.9     2.  Incidental finding of right lobe of liver cystic lesion.  Outpatient age-appropriate work-up.  3.  Anxiety and depression.  Home medications continued.  4.  Essential hypertension.  On Norvasc.     Condition - Extremely Guarded  Family Communication  :  Called daughter [name] on listed cell phone number [phone] on 09/06/2019 at 12:20 PM.  No response after over 15 rings  Code Status :  Full  Diet :   Diet Order            Diet regular Room service appropriate? Yes; Fluid consistency: Thin  Diet effective now               Disposition Plan  : Stay in the hospital still getting treated for severe hypoxic respiratory failure due to COVID-19 pneumonia currently on high flow oxygen.  Consults  : None  Procedures  :    PUD Prophylaxis :   CTA - 1. No evidence of acute pulmonary embolism. 2. Moderate severity diffuse areas of atelectasis and/or infiltrate bilaterally, slightly more prominent within the bilateral lung bases. 3. 3.7 cm cystic appearing lesion in the right lobe of the liver.  DVT Prophylaxis  :  Lovenox   Lab Results  Component Value Date   PLT 392 09/06/2019    Inpatient Medications  Scheduled Meds:  albuterol  2 puff Inhalation TID   amLODipine  10 mg Oral Daily   benzonatate  200  mg Oral TID   clonazepam  0.5 mg Oral BID   dexamethasone  6 mg Oral Q24H   enoxaparin (LOVENOX) injection  40 mg Subcutaneous Q24H   loratadine  10 mg Oral Daily   multivitamin  1 tablet Oral Daily   sertraline  50 mg Oral Daily   sodium chloride flush  3 mL Intravenous Q12H   Continuous Infusions:  remdesivir 100 mg in NS 100 mL 100 mg (09/06/19 1007)   PRN Meds:.acetaminophen, albuterol, chlorpheniramine-HYDROcodone, guaiFENesin-dextromethorphan, hydrALAZINE, LORazepam, sodium chloride  Antibiotics  :    Anti-infectives (From admission, onward)   Start     Dose/Rate Route Frequency Ordered Stop   09/04/19 1000  remdesivir 100 mg in sodium chloride 0.9 % 100 mL IVPB  Status:  Discontinued     100 mg 200 mL/hr over 30 Minutes Intravenous Daily 09/03/19 0304 09/03/19 0307   09/04/19 1000  remdesivir 100 mg in sodium chloride 0.9 % 100 mL IVPB     100 mg 200 mL/hr over 30 Minutes Intravenous Daily 09/02/19 2304 09/08/19 0959   09/03/19 0315  remdesivir 200 mg in sodium chloride 0.9% 250 mL IVPB  Status:  Discontinued     200 mg 580 mL/hr over 30 Minutes Intravenous Once 09/03/19 0304 09/03/19 0307   09/03/19 0000  remdesivir 100 mg in sodium chloride 0.9 % 100 mL IVPB     100 mg 200 mL/hr over 30 Minutes Intravenous Every 1 hr x 2 09/02/19 2304 09/03/19 0154   09/02/19 2100  cefTRIAXone (ROCEPHIN) 2  g in sodium chloride 0.9 % 100 mL IVPB  Status:  Discontinued     2 g 200 mL/hr over 30 Minutes Intravenous Every 24 hours 09/02/19 2032 09/03/19 0304   09/02/19 2100  azithromycin (ZITHROMAX) 500 mg in sodium chloride 0.9 % 250 mL IVPB  Status:  Discontinued     500 mg 250 mL/hr over 60 Minutes Intravenous Every 24 hours 09/02/19 2032 09/03/19 0304       Time Spent in minutes  30   Lala Lund M.D on 09/06/2019 at 12:20 PM  To page go to [email or web address] - password [facility]  [facility] -  Office  [phone]   See all Orders from today for further details     Objective:   Vitals:   09/06/19 0236 09/06/19 0237 09/06/19 0400 09/06/19 0738  BP: 132/75  133/77 126/82  Pulse: 73 68 67 74  Resp:    16  Temp: 98.7 F (37.1 C)  97.6 F (36.4 C) 98 F (36.7 C)  TempSrc: Axillary  Axillary Axillary  SpO2: (!) 87% 92% 90% 93%  Weight:      Height:        Wt Readings from Last 3 Encounters:  09/05/19 99.1 kg  03/17/16 75.3 kg     Intake/Output Summary (Last 24 hours) at 09/06/2019 1220 Last data filed at 09/05/2019 1400 Gross per 24 hour  Intake 100 ml  Output --  Net 100 ml     Physical Exam  Awake Alert, No new F.N deficits, Normal affect [place].AT,PERRAL Supple Neck,No JVD, No cervical lymphadenopathy appriciated.  Symmetrical Chest wall movement, Good air movement bilaterally, CTAB RRR,No Gallops,Rubs or new Murmurs, No Parasternal Heave +ve B.Sounds, Abd Soft, No tenderness, No organomegaly appriciated, No rebound - guarding or rigidity. No Cyanosis, Clubbing or edema, No new Rash or bruise       Data Review:    CBC Recent Labs  Lab 09/02/19 1912 09/02/19 1912 09/03/19 0427 09/04/19 0450 09/04/19 1103 09/05/19 0428 09/06/19 0849  WBC 13.7*  --  10.7* 6.3  --  6.7 9.0  HGB 14.8   < > 12.3 8.5* 13.4 14.2 14.5  HCT 46.1*   < > 40.5 27.7* 42.4 45.0 44.7  PLT 353  --  269 203  --  217 392  MCV 100.0  --  102.8* 104.1*  --  101.4* 98.7  MCH 32.1  --  31.2 32.0  --  32.0 32.0  MCHC 32.1  --  30.4 30.7  --  31.6 32.4  RDW 12.8  --  13.0 12.8  --  12.6 12.5  LYMPHSABS 1.1  --  1.6 0.6*  --  1.7  --   MONOABS 0.7  --  0.8 0.6  --  0.4  --   EOSABS 0.0  --  0.0 0.0  --  0.0  --   BASOSABS 0.0  --  0.0 0.0  --  0.0  --    < > = values in this interval not displayed.    Chemistries  Recent Labs  Lab 09/02/19 1912 09/03/19 0427 09/03/19 0433 09/04/19 0450 09/04/19 1103 09/05/19 0428 09/06/19 0849  NA 140  --   --   --  140 141 138  K 3.7  --   --   --  3.8 3.7 4.3  CL 105  --   --   --  103 104 99  CO2 22  --   --    --  27 29 24  GLUCOSE 183*  --   --   --  177* 103* 137*  BUN 15  --   --   --  _0 CREATININE 0.89  --  0.68  --  0.61 0.65 0.67  CALCIUM 9.5  --   --   --  9.1 8.8* 9.3  AST 38  --   --   --  31  --   --   ALT 59*  --   --   --  61*  --   --   ALKPHOS 65  --   --   --  58  --   --   BILITOT 0.9  --   --   --  1.1  --   --   MG  --  2.0  --  2.3  --  2.5* 2.3  INR 1.0  --   --   --   --   --   --   HGBA1C  --  5.2  --   --   --   --   --      ------------------------------------------------------------------------------------------------------------------ No results for input(s): CHOL, HDL, LDLCALC, TRIG, CHOLHDL, LDLDIRECT in the last 72 hours.  Lab Results  Component Value Date   HGBA1C 5.2 09/03/2019   ------------------------------------------------------------------------------------------------------------------ No results for input(s): TSH, T4TOTAL, T3FREE, THYROIDAB in the last 72 hours.  Invalid input(s): FREET3  Cardiac Enzymes No results for input(s): CKMB, TROPONINI, MYOGLOBIN in the last 168 hours.  Invalid input(s): CK ------------------------------------------------------------------------------------------------------------------    Component Value Date/Time   BNP 46.8 09/06/2019 0849    Micro Results Recent Results (from the past 240 hour(s))  Culture, blood (x 2)     Status: None (Preliminary result)   Collection Time: 09/02/19  9:57 PM   Specimen: BLOOD  Result Value Ref Range Status   Specimen Description   Final    BLOOD LEFT ANTECUBITAL Performed at [facility] [address], [place], [place] [address]    Special Requests   Final    BOTTLES DRAWN AEROBIC AND ANAEROBIC Blood Culture adequate volume Performed at [facility] [address], [place], [place] [address]    Culture   Final    NO GROWTH 3 DAYS Performed at [facility], [place] [address], [place], [place] [address]    Report  Status PENDING  Incomplete  Culture, blood (x 2)     Status: None (Preliminary result)   Collection Time: 09/02/19  9:57 PM   Specimen: BLOOD RIGHT HAND  Result Value Ref Range Status   Specimen Description BLOOD RIGHT HAND  Final   Special Requests AEROBIC BOTTLE ONLY Blood Culture adequate volume  Final   Culture   Final    NO GROWTH 3 DAYS Performed at [facility], [place] [address], [place], [place] [address]    Report Status PENDING  Incomplete    Radiology Reports CT Angio Chest PE W and/or Wo Contrast  Result Date: 09/02/2019 CLINICAL DATA:  Shortness of breath. EXAM: CT ANGIOGRAPHY CHEST WITH CONTRAST TECHNIQUE: Multidetector CT imaging of the chest was performed using the standard protocol during bolus administration of intravenous contrast. Multiplanar CT image reconstructions and MIPs were obtained to evaluate the vascular anatomy. CONTRAST:  180m OMNIPAQUE IOHEXOL 350 MG/ML SOLN COMPARISON:  None. FINDINGS: Cardiovascular: Satisfactory opacification of the pulmonary arteries to the segmental level. No evidence of pulmonary embolism. Normal heart size. No pericardial effusion. Mediastinum/Nodes: No enlarged mediastinal,  hilar, or axillary lymph nodes. Thyroid gland, trachea, and esophagus demonstrate no significant findings. Lungs/Pleura: Moderate severity diffuse areas of atelectasis and/or infiltrate are seen bilaterally, slightly more prominent within the bilateral lung bases. There is no evidence of a pleural effusion or pneumothorax. Upper Abdomen: A 3.7 cm x 2.1 cm cystic appearing areas seen within the posteromedial aspect of the right lobe of the liver. Musculoskeletal: Multilevel degenerative changes seen throughout the thoracic spine. Review of the MIP images confirms the above findings. IMPRESSION: 1. No evidence of acute pulmonary embolism. 2. Moderate severity diffuse areas of atelectasis and/or infiltrate bilaterally, slightly more prominent within the bilateral lung  bases. 3. 3.7 cm cystic appearing lesion in the right lobe of the liver. Electronically Signed   By: Virgina Norfolk M.D.   On: 09/02/2019 21:28   DG Chest Port 1 View  Result Date: 09/06/2019 CLINICAL DATA:  Shortness of breath EXAM: PORTABLE CHEST 1 VIEW COMPARISON:  09/02/2019 plain film and CT FINDINGS: Midline trachea. Mild cardiomegaly. Given differences in technique, similar left greater than right peripheral and basilar predominant interstitial opacities. Mild right hemidiaphragm elevation. No pleural effusion or pneumothorax. IMPRESSION: No significant change in basilar and peripheral predominant interstitial opacities, most consistent with COVID-19 pneumonia. Electronically Signed   By: Abigail Miyamoto M.D.   On: 09/06/2019 08:47   DG Chest Port 1 View  Result Date: 09/02/2019 CLINICAL DATA:  Short of breath. EXAM: PORTABLE CHEST 1 VIEW COMPARISON:  None FINDINGS: Cardiac enlargement. Decreased lung volumes. Bilateral peripheral and lower lobe predominant opacities are identified. No pleural effusion or edema. IMPRESSION: Bilateral peripheral and lower lobe predominant opacities, which in the acute setting are concerning for multifocal pneumonia. Electronically Signed   By: Kerby Moors M.D.   On: 09/02/2019 19:51

## 2019-09-07 LAB — COMPREHENSIVE METABOLIC PANEL
ALT: 55 U/L — ABNORMAL HIGH (ref 0–44)
AST: 27 U/L (ref 15–41)
Albumin: 3.1 g/dL — ABNORMAL LOW (ref 3.5–5.0)
Alkaline Phosphatase: 57 U/L (ref 38–126)
Anion gap: 12 (ref 5–15)
BUN: 18 mg/dL (ref 8–23)
CO2: 27 mmol/L (ref 22–32)
Calcium: 9.2 mg/dL (ref 8.9–10.3)
Chloride: 98 mmol/L (ref 98–111)
Creatinine, Ser: 0.76 mg/dL (ref 0.44–1.00)
GFR calc Af Amer: >60 mL/min (ref >60)
GFR calc non Af Amer: >60 mL/min (ref >60)
Glucose, Bld: 102 mg/dL — ABNORMAL HIGH (ref 70–99)
Potassium: 4.1 mmol/L (ref 3.5–5.1)
Sodium: 137 mmol/L (ref 135–145)
Total Bilirubin: 1.5 mg/dL — ABNORMAL HIGH (ref 0.3–1.2)
Total Protein: 6.9 g/dL (ref 6.5–8.1)

## 2019-09-07 LAB — CBC WITH DIFFERENTIAL/PLATELET
Abs Immature Granulocytes: 0.12 10*3/uL — ABNORMAL HIGH (ref 0.00–0.07)
Basophils Absolute: 0 10*3/uL (ref 0.0–0.1)
Basophils Relative: 0 %
Eosinophils Absolute: 0.1 10*3/uL (ref 0.0–0.5)
Eosinophils Relative: 1 %
HCT: 42.4 % (ref 36.0–46.0)
Hemoglobin: 13.7 g/dL (ref 12.0–15.0)
Immature Granulocytes: 1 %
Lymphocytes Relative: 18 %
Lymphs Abs: 1.8 10*3/uL (ref 0.7–4.0)
MCH: 31.8 pg (ref 26.0–34.0)
MCHC: 32.3 g/dL (ref 30.0–36.0)
MCV: 98.4 fL (ref 80.0–100.0)
Monocytes Absolute: 0.5 10*3/uL (ref 0.1–1.0)
Monocytes Relative: 5 %
Neutro Abs: 7.3 10*3/uL (ref 1.7–7.7)
Neutrophils Relative %: 75 %
Platelets: 399 10*3/uL (ref 150–400)
RBC: 4.31 MIL/uL (ref 3.87–5.11)
RDW: 12.4 % (ref 11.5–15.5)
WBC: 9.8 10*3/uL (ref 4.0–10.5)
nRBC: 0 % (ref 0.0–0.2)

## 2019-09-07 LAB — MAGNESIUM: Magnesium: 2.2 mg/dL (ref 1.7–2.4)

## 2019-09-07 LAB — D-DIMER, QUANTITATIVE: D-Dimer, Quant: 0.83 ug/mL-FEU — ABNORMAL HIGH (ref 0.00–0.50)

## 2019-09-07 LAB — BRAIN NATRIURETIC PEPTIDE: B Natriuretic Peptide: 29.7 pg/mL (ref 0.0–100.0)

## 2019-09-07 LAB — C-REACTIVE PROTEIN: CRP: 1.4 mg/dL — ABNORMAL HIGH (ref <1.0)

## 2019-09-07 NOTE — Progress Notes (Signed)
PROGRESS NOTE                                                                                                                                                                                                             Patient Demographics:    Kristen Bender, is a 64 y.o. female, DOB - Jan 18, 1956, HRC:[id number]  Outpatient Primary MD for the patient is Scot Jun, FNP    LOS - 5  Admit date - 09/02/2019    Chief Complaint  Patient presents with  . Shortness of Breath       Brief Narrative - This is a 64 year old Portuguese/Spanish-speaking female with past medical history of GERD, IBS, anxiety, depression who tested positive for COVID-19 2 weeks ago and presented to [facility] with worsening shortness of breath, she was diagnosed with severe acute hypoxic respiratory failure due to COVID-19 pneumonia.  He was placed on high flow nasal cannula oxygen along with nonrebreather mask, she received IV steroids, remdesivir and Actemra at [facility] hospital and was transferred to [facility] on 09/05/2019 for further care.   Subjective:   Patient in bed, appears comfortable, denies any headache, no fever, no chest pain or pressure, no shortness of breath , no abdominal pain. No focal weakness.   Assessment  & Plan :     1. Acute Hypoxic Resp. Failure due to Acute Covid 19 Viral Pneumonitis during the ongoing 2020 Covid 19 Pandemic - she has severe disease, currently on high flow nasal cannula oxygen along with nonrebreather mask, she has already received appropriate treatment with IV steroids, remdesivir and Actemra, continue steroid taper, encourage to advance activity and titrate down oxygen gradually as tolerated now on 5lits o2.  She is still quite sick but seems to be making some progress.  Encouraged the patient to sit up in chair in the daytime use I-S and flutter valve for pulmonary toiletry and then prone in bed when at  night.    SpO2: 93 % O2 Flow Rate (L/min): 6 L/min  Recent Labs  Lab 09/02/19 1913 09/03/19 0427 09/03/19 0433 09/04/19 0450 09/05/19 0428 09/06/19 0849 09/07/19 0356  CRP  --   --  5.6* 10.8* 7.1* 3.0* 1.4*  DDIMER  --  0.86*  --  1.25* 1.04* 0.90* 0.83*  BNP 80.7  --   --   --   --  46.8 29.7  PROCALCITON  --   --  <0.10  --   --   --   --     Hepatic Function Latest Ref Rng & Units 09/07/2019 09/04/2019 09/02/2019  Total Protein 6.5 - 8.1 g/dL 6.9 7.5 7.9  Albumin 3.5 - 5.0 g/dL 3.1(L) 3.2(L) 3.6  AST 15 - 41 U/L 27 31 38  ALT 0 - 44 U/L 55(H) 61(H) 59(H)  Alk Phosphatase 38 - 126 U/L 57 58 65  Total Bilirubin 0.3 - 1.2 mg/dL 1.5(H) 1.1 0.9     2.  Incidental finding of right lobe of liver cystic lesion.  Outpatient age-appropriate work-up.  3.  Anxiety and depression.  Home medications continued.  4.  Essential hypertension.  On Norvasc.     Condition - Extremely Guarded  Family Communication  :  Called daughter [name] on listed cell phone number [phone] on 09/06/2019 at 12:20 PM.  No response after over 15 rings, left detailed message on 09/07/2019 at 11:10 AM.  Code Status :  Full  Diet :   Diet Order            Diet regular Room service appropriate? Yes; Fluid consistency: Thin  Diet effective now               Disposition Plan  : Stay in the hospital still getting treated for severe hypoxic respiratory failure due to COVID-19 pneumonia currently on high flow oxygen.  Consults  : None  Procedures  :    PUD Prophylaxis :   CTA - 1. No evidence of acute pulmonary embolism. 2. Moderate severity diffuse areas of atelectasis and/or infiltrate bilaterally, slightly more prominent within the bilateral lung bases. 3. 3.7 cm cystic appearing lesion in the right lobe of the liver.  DVT Prophylaxis  :  Lovenox   Lab Results  Component Value Date   PLT 399 09/07/2019    Inpatient Medications  Scheduled Meds: . albuterol  2 puff Inhalation TID    . amLODipine  10 mg Oral Daily  . benzonatate  200 mg Oral TID  . clonazepam  0.5 mg Oral BID  . dexamethasone  6 mg Oral Q24H  . enoxaparin (LOVENOX) injection  40 mg Subcutaneous Q24H  . loratadine  10 mg Oral Daily  . multivitamin  1 tablet Oral Daily  . sertraline  50 mg Oral Daily  . sodium chloride flush  3 mL Intravenous Q12H   Continuous Infusions:  PRN Meds:.acetaminophen, albuterol, chlorpheniramine-HYDROcodone, guaiFENesin-dextromethorphan, hydrALAZINE, LORazepam, sodium chloride  Antibiotics  :    Anti-infectives (From admission, onward)   Start     Dose/Rate Route Frequency Ordered Stop   09/04/19 1000  remdesivir 100 mg in sodium chloride 0.9 % 100 mL IVPB  Status:  Discontinued     100 mg 200 mL/hr over 30 Minutes Intravenous Daily 09/03/19 0304 09/03/19 0307   09/04/19 1000  remdesivir 100 mg in sodium chloride 0.9 % 100 mL IVPB     100 mg 200 mL/hr over 30 Minutes Intravenous Daily 09/02/19 2304 09/07/19 1021   09/03/19 0315  remdesivir 200 mg in sodium chloride 0.9% 250 mL IVPB  Status:  Discontinued     200 mg 580 mL/hr over 30 Minutes Intravenous Once 09/03/19 0304 09/03/19 0307   09/03/19 0000  remdesivir 100 mg in sodium chloride 0.9 % 100 mL IVPB     100 mg 200 mL/hr over 30 Minutes Intravenous Every 1 hr x 2 09/02/19 2304 09/03/19 0154  09/02/19 2100  cefTRIAXone (ROCEPHIN) 2 g in sodium chloride 0.9 % 100 mL IVPB  Status:  Discontinued     2 g 200 mL/hr over 30 Minutes Intravenous Every 24 hours 09/02/19 2032 09/03/19 0304   09/02/19 2100  azithromycin (ZITHROMAX) 500 mg in sodium chloride 0.9 % 250 mL IVPB  Status:  Discontinued     500 mg 250 mL/hr over 60 Minutes Intravenous Every 24 hours 09/02/19 2032 09/03/19 0304       Time Spent in minutes  30   Lala Lund M.D on 09/07/2019 at 11:01 AM  To page go to [email or web address] - password [facility]  [facility] -  Office  [phone]   See all Orders from today for further details     Objective:   Vitals:   09/06/19 2349 09/07/19 0340 09/07/19 0400 09/07/19 0800  BP:   117/76 122/77  Pulse:  72 67 63  Resp:  13 (!) 25 (!) 28  Temp: 98.2 F (36.8 C)  (!) 97.5 F (36.4 C) (!) 97.5 F (36.4 C)  TempSrc: Axillary  Oral Axillary  SpO2:  90% 94% 93%  Weight:      Height:        Wt Readings from Last 3 Encounters:  09/05/19 99.1 kg  03/17/16 75.3 kg     Intake/Output Summary (Last 24 hours) at 09/07/2019 1101 Last data filed at 09/06/2019 1500 Gross per 24 hour  Intake --  Output 600 ml  Net -600 ml     Physical Exam  Awake Alert, No new F.N deficits, Normal affect [place].AT,PERRAL Supple Neck,No JVD, No cervical lymphadenopathy appriciated.  Symmetrical Chest wall movement, Good air movement bilaterally, CTAB RRR,No Gallops, Rubs or new Murmurs, No Parasternal Heave +ve B.Sounds, Abd Soft, No tenderness, No organomegaly appriciated, No rebound - guarding or rigidity. No Cyanosis, Clubbing or edema, No new Rash or bruise   Data Review:    CBC Recent Labs  Lab 09/02/19 1912 09/02/19 1912 09/03/19 0427 09/03/19 0427 09/04/19 0450 09/04/19 1103 09/05/19 0428 09/06/19 0849 09/07/19 0356  WBC 13.7*   < > 10.7*  --  6.3  --  6.7 9.0 9.8  HGB 14.8   < > 12.3   < > 8.5* 13.4 14.2 14.5 13.7  HCT 46.1*   < > 40.5   < > 27.7* 42.4 45.0 44.7 42.4  PLT 353   < > 269  --  203  --  217 392 399  MCV 100.0   < > 102.8*  --  104.1*  --  101.4* 98.7 98.4  MCH 32.1   < > 31.2  --  32.0  --  32.0 32.0 31.8  MCHC 32.1   < > 30.4  --  30.7  --  31.6 32.4 32.3  RDW 12.8   < > 13.0  --  12.8  --  12.6 12.5 12.4  LYMPHSABS 1.1  --  1.6  --  0.6*  --  1.7  --  1.8  MONOABS 0.7  --  0.8  --  0.6  --  0.4  --  0.5  EOSABS 0.0  --  0.0  --  0.0  --  0.0  --  0.1  BASOSABS 0.0  --  0.0  --  0.0  --  0.0  --  0.0   < > = values in this interval not displayed.    [place]  Lab 09/02/19 1912 09/02/19 1912 09/03/19 0427 09/03/19 0433 09/04/19  3299  09/04/19 1103 09/05/19 0428 09/06/19 0849 09/07/19 0356  NA 140  --   --   --   --  140 141 138 137  K 3.7  --   --   --   --  3.8 3.7 4.3 4.1  CL 105  --   --   --   --  103 104 99 98  CO2 22  --   --   --   --  '27 29 24 27  ' GLUCOSE 183*  --   --   --   --  177* 103* 137* 102*  BUN 15  --   --   --   --  '16 20 17 18  ' CREATININE 0.89   < >  --  0.68  --  0.61 0.65 0.67 0.76  CALCIUM 9.5  --   --   --   --  9.1 8.8* 9.3 9.2  AST 38  --   --   --   --  31  --   --  27  ALT 59*  --   --   --   --  61*  --   --  55*  ALKPHOS 65  --   --   --   --  58  --   --  57  BILITOT 0.9  --   --   --   --  1.1  --   --  1.5*  MG  --   --  2.0  --  2.3  --  2.5* 2.3 2.2  INR 1.0  --   --   --   --   --   --   --   --   HGBA1C  --   --  5.2  --   --   --   --   --   --    < > = values in this interval not displayed.     ------------------------------------------------------------------------------------------------------------------ No results for input(s): CHOL, HDL, LDLCALC, TRIG, CHOLHDL, LDLDIRECT in the last 72 hours.  Lab Results  Component Value Date   HGBA1C 5.2 09/03/2019   ------------------------------------------------------------------------------------------------------------------ No results for input(s): TSH, T4TOTAL, T3FREE, THYROIDAB in the last 72 hours.  Invalid input(s): FREET3  Cardiac Enzymes No results for input(s): CKMB, TROPONINI, MYOGLOBIN in the last 168 hours.  Invalid input(s): CK ------------------------------------------------------------------------------------------------------------------    Component Value Date/Time   BNP 29.7 09/07/2019 0356    Micro Results Recent Results (from the past 240 hour(s))  Culture, blood (x 2)     Status: None (Preliminary result)   Collection Time: 09/02/19  9:57 PM   Specimen: BLOOD  Result Value Ref Range Status   Specimen Description   Final    BLOOD LEFT ANTECUBITAL Performed at [place] [facility] [address] [place], [place] [address]    Special Requests   Final    BOTTLES DRAWN AEROBIC AND ANAEROBIC Blood Culture adequate volume Performed at [place] [facility] [address], [place], [place] [address]    Culture   Final    NO GROWTH 4 DAYS Performed at [facility], [place] [address], [place], [place] [address]    Report Status PENDING  Incomplete  Culture, blood (x 2)     Status: None (Preliminary result)   Collection Time: 09/02/19  9:57 PM   Specimen: BLOOD RIGHT HAND  Result Value Ref Range Status   Specimen Description BLOOD RIGHT HAND  Final   Special Requests AEROBIC BOTTLE ONLY Blood Culture adequate volume  Final   Culture   Final    NO GROWTH 4 DAYS Performed at [facility], [place] [address] [place], [place] [address]    Report Status PENDING  Incomplete    Radiology Reports CT Angio Chest PE W and/or Wo Contrast  Result Date: 09/02/2019 CLINICAL DATA:  Shortness of breath. EXAM: CT ANGIOGRAPHY CHEST WITH CONTRAST TECHNIQUE: Multidetector CT imaging of the chest was performed using the standard protocol during bolus administration of intravenous contrast. Multiplanar CT image reconstructions and MIPs were obtained to evaluate the vascular anatomy. CONTRAST:  173m OMNIPAQUE IOHEXOL 350 MG/ML SOLN COMPARISON:  None. FINDINGS: Cardiovascular: Satisfactory opacification of the pulmonary arteries to the segmental level. No evidence of pulmonary embolism. Normal heart size. No pericardial effusion. Mediastinum/Nodes: No enlarged mediastinal, hilar, or axillary lymph nodes. Thyroid gland, trachea, and esophagus demonstrate no significant findings. Lungs/Pleura: Moderate severity diffuse areas of atelectasis and/or infiltrate are seen bilaterally, slightly more prominent within the bilateral lung bases. There is no evidence of a pleural effusion or pneumothorax. Upper Abdomen: A 3.7 cm x 2.1 cm cystic appearing areas seen within the posteromedial  aspect of the right lobe of the liver. Musculoskeletal: Multilevel degenerative changes seen throughout the thoracic spine. Review of the MIP images confirms the above findings. IMPRESSION: 1. No evidence of acute pulmonary embolism. 2. Moderate severity diffuse areas of atelectasis and/or infiltrate bilaterally, slightly more prominent within the bilateral lung bases. 3. 3.7 cm cystic appearing lesion in the right lobe of the liver. Electronically Signed   By: TVirgina NorfolkM.D.   On: 09/02/2019 21:28   DG Chest Port 1 View  Result Date: 09/06/2019 CLINICAL DATA:  Shortness of breath EXAM: PORTABLE CHEST 1 VIEW COMPARISON:  09/02/2019 plain film and CT FINDINGS: Midline trachea. Mild cardiomegaly. Given differences in technique, similar left greater than right peripheral and basilar predominant interstitial opacities. Mild right hemidiaphragm elevation. No pleural effusion or pneumothorax. IMPRESSION: No significant change in basilar and peripheral predominant interstitial opacities, most consistent with COVID-19 pneumonia. Electronically Signed   By: KAbigail MiyamotoM.D.   On: 09/06/2019 08:47   DG Chest Port 1 View  Result Date: 09/02/2019 CLINICAL DATA:  Short of breath. EXAM: PORTABLE CHEST 1 VIEW COMPARISON:  None FINDINGS: Cardiac enlargement. Decreased lung volumes. Bilateral peripheral and lower lobe predominant opacities are identified. No pleural effusion or edema. IMPRESSION: Bilateral peripheral and lower lobe predominant opacities, which in the acute setting are concerning for multifocal pneumonia. Electronically Signed   By: TKerby MoorsM.D.   On: 09/02/2019 19:51

## 2019-09-07 NOTE — Plan of Care (Signed)
  Problem: Education: Goal: Knowledge of risk factors and measures for prevention of condition will improve Outcome: Progressing   Problem: Coping: Goal: Psychosocial and spiritual needs will be supported Outcome: Progressing   Problem: Respiratory: Goal: Will maintain a patent airway Outcome: Progressing Goal: Complications related to the disease process, condition or treatment will be avoided or minimized Outcome: Progressing   Problem: Education: Goal: Knowledge of General Education information will improve Description: Including pain rating scale, medication(s)/side effects and non-pharmacologic comfort measures Outcome: Progressing   Problem: Clinical Measurements: Goal: Ability to maintain clinical measurements within normal limits will improve Outcome: Progressing Goal: Will remain free from infection Outcome: Progressing Goal: Diagnostic test results will improve Outcome: Progressing Goal: Respiratory complications will improve Outcome: Progressing Goal: Cardiovascular complication will be avoided Outcome: Progressing   Problem: Health Behavior/Discharge Planning: Goal: Ability to manage health-related needs will improve Outcome: Progressing   Problem: Activity: Goal: Risk for activity intolerance will decrease Outcome: Progressing   Problem: Nutrition: Goal: Adequate nutrition will be maintained Outcome: Progressing   Problem: Coping: Goal: Level of anxiety will decrease Outcome: Progressing   Problem: Elimination: Goal: Will not experience complications related to bowel motility Outcome: Progressing Goal: Will not experience complications related to urinary retention Outcome: Progressing   Problem: Pain Managment: Goal: General experience of comfort will improve Outcome: Progressing   Problem: Safety: Goal: Ability to remain free from injury will improve Outcome: Progressing   Problem: Skin Integrity: Goal: Risk for impaired skin integrity will  decrease Outcome: Progressing

## 2019-09-08 ENCOUNTER — Inpatient Hospital Stay (HOSPITAL_COMMUNITY): Payer: HRSA Program

## 2019-09-08 LAB — COMPREHENSIVE METABOLIC PANEL
ALT: 46 U/L — ABNORMAL HIGH (ref 0–44)
AST: 24 U/L (ref 15–41)
Albumin: 3 g/dL — ABNORMAL LOW (ref 3.5–5.0)
Alkaline Phosphatase: 52 U/L (ref 38–126)
Anion gap: 10 (ref 5–15)
BUN: 23 mg/dL (ref 8–23)
CO2: 27 mmol/L (ref 22–32)
Calcium: 8.9 mg/dL (ref 8.9–10.3)
Chloride: 98 mmol/L (ref 98–111)
Creatinine, Ser: 0.67 mg/dL (ref 0.44–1.00)
GFR calc Af Amer: >60 mL/min (ref >60)
GFR calc non Af Amer: >60 mL/min (ref >60)
Glucose, Bld: 106 mg/dL — ABNORMAL HIGH (ref 70–99)
Potassium: 4 mmol/L (ref 3.5–5.1)
Sodium: 135 mmol/L (ref 135–145)
Total Bilirubin: 1.5 mg/dL — ABNORMAL HIGH (ref 0.3–1.2)
Total Protein: 6.4 g/dL — ABNORMAL LOW (ref 6.5–8.1)

## 2019-09-08 LAB — BRAIN NATRIURETIC PEPTIDE: B Natriuretic Peptide: 37.9 pg/mL (ref 0.0–100.0)

## 2019-09-08 LAB — CBC WITH DIFFERENTIAL/PLATELET
Abs Immature Granulocytes: 0.13 10*3/uL — ABNORMAL HIGH (ref 0.00–0.07)
Basophils Absolute: 0 10*3/uL (ref 0.0–0.1)
Basophils Relative: 0 %
Eosinophils Absolute: 0.1 10*3/uL (ref 0.0–0.5)
Eosinophils Relative: 1 %
HCT: 40.9 % (ref 36.0–46.0)
Hemoglobin: 13.3 g/dL (ref 12.0–15.0)
Immature Granulocytes: 1 %
Lymphocytes Relative: 15 %
Lymphs Abs: 1.4 10*3/uL (ref 0.7–4.0)
MCH: 31.7 pg (ref 26.0–34.0)
MCHC: 32.5 g/dL (ref 30.0–36.0)
MCV: 97.6 fL (ref 80.0–100.0)
Monocytes Absolute: 0.4 10*3/uL (ref 0.1–1.0)
Monocytes Relative: 4 %
Neutro Abs: 7.2 10*3/uL (ref 1.7–7.7)
Neutrophils Relative %: 79 %
Platelets: 363 10*3/uL (ref 150–400)
RBC: 4.19 MIL/uL (ref 3.87–5.11)
RDW: 12.4 % (ref 11.5–15.5)
WBC: 9.2 10*3/uL (ref 4.0–10.5)
nRBC: 0 % (ref 0.0–0.2)

## 2019-09-08 LAB — CULTURE, BLOOD (ROUTINE X 2)
Culture: NO GROWTH
Culture: NO GROWTH
Special Requests: ADEQUATE
Special Requests: ADEQUATE

## 2019-09-08 LAB — C-REACTIVE PROTEIN: CRP: 0.8 mg/dL (ref <1.0)

## 2019-09-08 LAB — D-DIMER, QUANTITATIVE: D-Dimer, Quant: 1.39 ug/mL-FEU — ABNORMAL HIGH (ref 0.00–0.50)

## 2019-09-08 LAB — MAGNESIUM: Magnesium: 2.1 mg/dL (ref 1.7–2.4)

## 2019-09-08 MED ORDER — OXYMETAZOLINE HCL 0.05 % NA SOLN
1.0000 | Freq: Two times a day (BID) | NASAL | Status: DC | PRN
  Administered 2019-09-08: 1 via NASAL
  Filled 2019-09-08: qty 30

## 2019-09-08 MED ORDER — DEXAMETHASONE 2 MG PO TABS
2.0000 mg | ORAL_TABLET | ORAL | Status: DC
  Administered 2019-09-09 – 2019-09-10 (×2): 2 mg via ORAL
  Filled 2019-09-08 (×3): qty 1

## 2019-09-08 MED ORDER — SERTRALINE HCL 50 MG PO TABS: 50.0000 mg | ORAL_TABLET | Freq: Once | ORAL | Status: AC

## 2019-09-08 MED ORDER — FUROSEMIDE 10 MG/ML IJ SOLN
40.0000 mg | Freq: Once | INTRAMUSCULAR | Status: AC
  Administered 2019-09-08: 40 mg via INTRAVENOUS
  Filled 2019-09-08: qty 4

## 2019-09-08 MED ORDER — SERTRALINE HCL 100 MG PO TABS
100.0000 mg | ORAL_TABLET | Freq: Every day | ORAL | Status: DC
  Administered 2019-09-09 – 2019-09-10 (×2): 100 mg via ORAL
  Filled 2019-09-08 (×2): qty 1

## 2019-09-08 MED ORDER — CLONAZEPAM 0.25 MG PO TBDP
1.0000 mg | ORAL_TABLET | Freq: Two times a day (BID) | ORAL | Status: DC
  Administered 2019-09-08 – 2019-09-10 (×6): 1 mg via ORAL
  Filled 2019-09-08: qty 8
  Filled 2019-09-08 (×5): qty 4
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
## 2019-09-08 NOTE — Progress Notes (Signed)
PROGRESS NOTE                                                                                                                                                                                                             Patient Demographics:    Kristen Bender, is a 64 y.o. female, DOB - [birth year], HTX:[id number]  Outpatient Primary MD for the patient is Scot Jun, FNP    LOS - 6  Admit date - 09/02/2019    Chief Complaint  Patient presents with  . Shortness of Breath       Brief Narrative - This is a 64 year old Portuguese/Spanish-speaking female with past medical history of GERD, IBS, anxiety, depression who tested positive for COVID-19 2 weeks ago and presented to [facility] with worsening shortness of breath, she was diagnosed with severe acute hypoxic respiratory failure due to COVID-19 pneumonia.  He was placed on high flow nasal cannula oxygen along with nonrebreather mask, she received IV steroids, remdesivir and Actemra at [facility] and was transferred to [facility] on 09/05/2019 for further care.   Subjective:   Patient in bed in no distress, denies any headache or chest pain, shortness of breath is improving, was doing great until I asked her to sit up in the chair and then all of a sudden had a panic attack.   Assessment  & Plan :     1. Acute Hypoxic Resp. Failure due to Acute Covid 19 Viral Pneumonitis during the ongoing 2020 Covid 19 Pandemic - she has severe disease, currently on high flow nasal cannula oxygen along with nonrebreather mask, she has already received appropriate treatment with IV steroids, remdesivir and Actemra, continue steroid taper, encourage to advance activity and titrate down oxygen gradually as tolerated now on 5lits o2.  She is still quite sick but seems to be making some progress.  Encouraged the patient to sit up in chair in the daytime use I-S and flutter valve for pulmonary  toiletry and then prone in bed when at night.    SpO2: 94 % O2 Flow Rate (L/min): 15 L/min  Recent Labs  Lab 09/02/19 1913 09/03/19 0427 09/03/19 0433 09/03/19 0433 09/04/19 0450 09/05/19 0428 09/06/19 0849 09/07/19 0356 09/08/19 0405  CRP  --   --  5.6*   < > 10.8* 7.1* 3.0* 1.4* 0.8  DDIMER  --    < >  --    < > 1.25* 1.04* 0.90* 0.83* 1.39*  BNP 80.7  --   --   --   --   --  46.8 29.7 37.9  PROCALCITON  --   --  <0.10  --   --   --   --   --   --    < > = values in this interval not displayed.    Hepatic Function Latest Ref Rng & Units 09/08/2019 09/07/2019 09/04/2019  Total Protein 6.5 - 8.1 g/dL 6.4(L) 6.9 7.5  Albumin 3.5 - 5.0 g/dL 3.0(L) 3.1(L) 3.2(L)  AST 15 - 41 U/L _0 ALT 0 - 44 U/L 46(H) 55(H) 61(H)  Alk Phosphatase 38 - 126 U/L 52 57 58  Total Bilirubin 0.3 - 1.2 mg/dL 1.5(H) 1.5(H) 1.1     2.  Incidental finding of right lobe of liver cystic lesion.  Outpatient age-appropriate work-up.  3.  Extreme anxiety and depression.  On Zoloft, Klonopin dose twice daily increased, as needed Ativan on board, patient has extreme anxiety, discussed with daughter and she says that she frequently has panic attacks at home and was recently enrolled with a psychologist but her anxiety continues to be out of control and is hindering her care, will involve psychiatry as well.  4.  Essential hypertension.  On Norvasc.     Condition - Extremely Guarded  Family Communication  :  Called daughter [name] on listed cell phone number [phone] on 09/06/2019 at 12:20 PM.  No response after over 15 rings, left detailed message on 09/07/2019 at 11:10 AM. Daughter x 2 09/08/19.  Code Status :  Full  Diet :   Diet Order            Diet regular Room service appropriate? Yes; Fluid consistency: Thin  Diet effective now               Disposition Plan  : Stay in the hospital still getting treated for severe hypoxic respiratory failure due to COVID-19 pneumonia currently on  high flow oxygen.  Consults  : Psych  Procedures  :    PUD Prophylaxis :   CTA - 1. No evidence of acute pulmonary embolism. 2. Moderate severity diffuse areas of atelectasis and/or infiltrate bilaterally, slightly more prominent within the bilateral lung bases. 3. 3.7 cm cystic appearing lesion in the right lobe of the liver.  DVT Prophylaxis  :  Lovenox   Lab Results  Component Value Date   PLT 363 09/08/2019    Inpatient Medications  Scheduled Meds: . albuterol  2 puff Inhalation TID  . amLODipine  10 mg Oral Daily  . benzonatate  200 mg Oral TID  . clonazepam  1 mg Oral BID  . [START ON 09/09/2019] dexamethasone  2 mg Oral Q24H  . enoxaparin (LOVENOX) injection  40 mg Subcutaneous Q24H  . loratadine  10 mg Oral Daily  . multivitamin  1 tablet Oral Daily  . sertraline  50 mg Oral Daily  . sodium chloride flush  3 mL Intravenous Q12H   Continuous Infusions:  PRN Meds:.acetaminophen, albuterol, chlorpheniramine-HYDROcodone, guaiFENesin-dextromethorphan, hydrALAZINE, LORazepam, sodium chloride  Antibiotics  :    Anti-infectives (From admission, onward)   Start     Dose/Rate Route Frequency Ordered Stop   09/04/19 1000  remdesivir 100 mg in sodium chloride 0.9 % 100 mL IVPB  Status:  Discontinued     100 mg 200 mL/hr  over 30 Minutes Intravenous Daily 09/03/19 0304 09/03/19 0307   09/04/19 1000  remdesivir 100 mg in sodium chloride 0.9 % 100 mL IVPB     100 mg 200 mL/hr over 30 Minutes Intravenous Daily 09/02/19 2304 09/07/19 1021   09/03/19 0315  remdesivir 200 mg in sodium chloride 0.9% 250 mL IVPB  Status:  Discontinued     200 mg 580 mL/hr over 30 Minutes Intravenous Once 09/03/19 0304 09/03/19 0307   09/03/19 0000  remdesivir 100 mg in sodium chloride 0.9 % 100 mL IVPB     100 mg 200 mL/hr over 30 Minutes Intravenous Every 1 hr x 2 09/02/19 2304 09/03/19 0154   09/02/19 2100  cefTRIAXone (ROCEPHIN) 2 g in sodium chloride 0.9 % 100 mL IVPB  Status:  Discontinued       2 g 200 mL/hr over 30 Minutes Intravenous Every 24 hours 09/02/19 2032 09/03/19 0304   09/02/19 2100  azithromycin (ZITHROMAX) 500 mg in sodium chloride 0.9 % 250 mL IVPB  Status:  Discontinued     500 mg 250 mL/hr over 60 Minutes Intravenous Every 24 hours 09/02/19 2032 09/03/19 0304       Time Spent in minutes  30   Lala Lund M.D on 09/08/2019 at 9:17 AM  To page go to [email or web address] - password [facility]  [facility] -  Office  [phone]   See all Orders from today for further details    Objective:   Vitals:   09/07/19 2350 09/07/19 2350 09/08/19 0432 09/08/19 0527  BP: 103/68  113/65   Pulse: 60  67 64  Resp: 19  (!) 27 (!) 25  Temp:  98.1 F (36.7 C) 97.8 F (36.6 C)   TempSrc:  Oral Oral   SpO2: 94%  93% 94%  Weight:      Height:        Wt Readings from Last 3 Encounters:  09/05/19 99.1 kg  03/17/16 75.3 kg     Intake/Output Summary (Last 24 hours) at 09/08/2019 0917 Last data filed at 09/07/2019 1600 Gross per 24 hour  Intake 120 ml  Output 500 ml  Net -380 ml     Physical Exam  Awake Alert, No new F.N deficits, +++ anxious affect [place].AT,PERRAL Supple Neck,No JVD, No cervical lymphadenopathy appriciated.  Symmetrical Chest wall movement, Good air movement bilaterally, few rales RRR,No Gallops, Rubs or new Murmurs, No Parasternal Heave +ve B.Sounds, Abd Soft, No tenderness, No organomegaly appriciated, No rebound - guarding or rigidity. No Cyanosis, Clubbing or edema, No new Rash or bruise    Data Review:    CBC Recent Labs  Lab 09/03/19 0427 09/03/19 0427 09/04/19 0450 09/04/19 0450 09/04/19 1103 09/05/19 0428 09/06/19 0849 09/07/19 0356 09/08/19 0405  WBC 10.7*   < > 6.3  --   --  6.7 9.0 9.8 9.2  HGB 12.3   < > 8.5*   < > 13.4 14.2 14.5 13.7 13.3  HCT 40.5   < > 27.7*   < > 42.4 45.0 44.7 42.4 40.9  PLT 269   < > 203  --   --  217 392 399 363  MCV 102.8*   < > 104.1*  --   --  101.4* 98.7 98.4 97.6  MCH 31.2   < > 32.0   --   --  32.0 32.0 31.8 31.7  MCHC 30.4   < > 30.7  --   --  31.6 32.4 32.3 32.5  RDW 13.0   < >  12.8  --   --  12.6 12.5 12.4 12.4  LYMPHSABS 1.6  --  0.6*  --   --  1.7  --  1.8 1.4  MONOABS 0.8  --  0.6  --   --  0.4  --  0.5 0.4  EOSABS 0.0  --  0.0  --   --  0.0  --  0.1 0.1  BASOSABS 0.0  --  0.0  --   --  0.0  --  0.0 0.0   < > = values in this interval not displayed.    Chemistries  Recent Labs  Lab 09/02/19 1912 09/02/19 1912 09/03/19 0427 09/03/19 0433 09/04/19 0450 09/04/19 1103 09/05/19 0428 09/06/19 0849 09/07/19 0356 09/08/19 0405  NA 140   < >  --   --   --  140 141 138 137 135  K 3.7   < >  --   --   --  3.8 3.7 4.3 4.1 4.0  CL 105   < >  --   --   --  103 104 99 98 98  CO2 22   < >  --   --   --  _0 GLUCOSE 183*   < >  --   --   --  177* 103* 137* 102* 106*  BUN 15   < >  --   --   --  _1 CREATININE 0.89   < >  --    < >  --  0.61 0.65 0.67 0.76 0.67  CALCIUM 9.5   < >  --   --   --  9.1 8.8* 9.3 9.2 8.9  AST 38  --   --   --   --  31  --   --  27 24  ALT 59*  --   --   --   --  61*  --   --  55* 46*  ALKPHOS 65  --   --   --   --  58  --   --  57 52  BILITOT 0.9  --   --   --   --  1.1  --   --  1.5* 1.5*  MG  --   --  2.0   < > 2.3  --  2.5* 2.3 2.2 2.1  INR 1.0  --   --   --   --   --   --   --   --   --   HGBA1C  --   --  5.2  --   --   --   --   --   --   --    < > = values in this interval not displayed.     ------------------------------------------------------------------------------------------------------------------ No results for input(s): CHOL, HDL, LDLCALC, TRIG, CHOLHDL, LDLDIRECT in the last 72 hours.  Lab Results  Component Value Date   HGBA1C 5.2 09/03/2019   ------------------------------------------------------------------------------------------------------------------ No results for input(s): TSH, T4TOTAL, T3FREE, THYROIDAB in the last 72 hours.  Invalid input(s): FREET3  Cardiac Enzymes No results  for input(s): CKMB, TROPONINI, MYOGLOBIN in the last 168 hours.  Invalid input(s): CK ------------------------------------------------------------------------------------------------------------------    Component Value Date/Time   BNP 37.9 09/08/2019 0405    Micro Results Recent Results (from the past 240 hour(s))  Culture, blood (x 2)     Status: None   Collection Time: 09/02/19  9:57 PM  Specimen: BLOOD  Result Value Ref Range Status   Specimen Description   Final    BLOOD LEFT ANTECUBITAL Performed at [facility] [address] [place], [place] [address]    Special Requests   Final    BOTTLES DRAWN AEROBIC AND ANAEROBIC Blood Culture adequate volume Performed at [facility] [address], [place], [place] [address]    Culture   Final    NO GROWTH 5 DAYS Performed at [facility], [place] [address] [place], [place] [address]    Report Status 09/08/2019 FINAL  Final  Culture, blood (x 2)     Status: None   Collection Time: 09/02/19  9:57 PM   Specimen: BLOOD RIGHT HAND  Result Value Ref Range Status   Specimen Description BLOOD RIGHT HAND  Final   Special Requests AEROBIC BOTTLE ONLY Blood Culture adequate volume  Final   Culture   Final    NO GROWTH 5 DAYS Performed at [facility], [place] [address] [place],  [address]    Report Status 09/08/2019 FINAL  Final    Radiology Reports CT Angio Chest PE W and/or Wo Contrast  Result Date: 09/02/2019 CLINICAL DATA:  Shortness of breath. EXAM: CT ANGIOGRAPHY CHEST WITH CONTRAST TECHNIQUE: Multidetector CT imaging of the chest was performed using the standard protocol during bolus administration of intravenous contrast. Multiplanar CT image reconstructions and MIPs were obtained to evaluate the vascular anatomy. CONTRAST:  191m OMNIPAQUE IOHEXOL 350 MG/ML SOLN COMPARISON:  None. FINDINGS: Cardiovascular: Satisfactory opacification of the pulmonary arteries to the  segmental level. No evidence of pulmonary embolism. Normal heart size. No pericardial effusion. Mediastinum/Nodes: No enlarged mediastinal, hilar, or axillary lymph nodes. Thyroid gland, trachea, and esophagus demonstrate no significant findings. Lungs/Pleura: Moderate severity diffuse areas of atelectasis and/or infiltrate are seen bilaterally, slightly more prominent within the bilateral lung bases. There is no evidence of a pleural effusion or pneumothorax. Upper Abdomen: A 3.7 cm x 2.1 cm cystic appearing areas seen within the posteromedial aspect of the right lobe of the liver. Musculoskeletal: Multilevel degenerative changes seen throughout the thoracic spine. Review of the MIP images confirms the above findings. IMPRESSION: 1. No evidence of acute pulmonary embolism. 2. Moderate severity diffuse areas of atelectasis and/or infiltrate bilaterally, slightly more prominent within the bilateral lung bases. 3. 3.7 cm cystic appearing lesion in the right lobe of the liver. Electronically Signed   By: TVirgina NorfolkM.D.   On: 09/02/2019 21:28   DG Chest Port 1 View  Result Date: 09/06/2019 CLINICAL DATA:  Shortness of breath EXAM: PORTABLE CHEST 1 VIEW COMPARISON:  09/02/2019 plain film and CT FINDINGS: Midline trachea. Mild cardiomegaly. Given differences in technique, similar left greater than right peripheral and basilar predominant interstitial opacities. Mild right hemidiaphragm elevation. No pleural effusion or pneumothorax. IMPRESSION: No significant change in basilar and peripheral predominant interstitial opacities, most consistent with COVID-19 pneumonia. Electronically Signed   By: KAbigail MiyamotoM.D.   On: 09/06/2019 08:47   DG Chest Port 1 View  Result Date: 09/02/2019 CLINICAL DATA:  Short of breath. EXAM: PORTABLE CHEST 1 VIEW COMPARISON:  None FINDINGS: Cardiac enlargement. Decreased lung volumes. Bilateral peripheral and lower lobe predominant opacities are identified. No pleural effusion  or edema. IMPRESSION: Bilateral peripheral and lower lobe predominant opacities, which in the acute setting are concerning for multifocal pneumonia. Electronically Signed   By: TKerby MoorsM.D.   On: 09/02/2019 19:51

## 2019-09-08 NOTE — Progress Notes (Signed)
Attempted to get patent up to chair from bed and pt started having an anxiety attack. Pt refusing to get up to chair without being placed on NRB mask. Explained to pt POC and the lack of need for the NRB.Continued to encourage, support, and try to motivate pt to get up to chair from bed. Pt O2 sats dropped into the 70s. Provider made aware and pt placed back in bed. Will CTM pt.

## 2019-09-09 LAB — COMPREHENSIVE METABOLIC PANEL
ALT: 37 U/L (ref 0–44)
AST: 35 U/L (ref 15–41)
Albumin: 3.3 g/dL — ABNORMAL LOW (ref 3.5–5.0)
Alkaline Phosphatase: 57 U/L (ref 38–126)
Anion gap: 14 (ref 5–15)
BUN: 25 mg/dL — ABNORMAL HIGH (ref 8–23)
CO2: 24 mmol/L (ref 22–32)
Calcium: 9.2 mg/dL (ref 8.9–10.3)
Chloride: 97 mmol/L — ABNORMAL LOW (ref 98–111)
Creatinine, Ser: 0.77 mg/dL (ref 0.44–1.00)
GFR calc Af Amer: >60 mL/min (ref >60)
GFR calc non Af Amer: >60 mL/min (ref >60)
Glucose, Bld: 116 mg/dL — ABNORMAL HIGH (ref 70–99)
Potassium: 4.8 mmol/L (ref 3.5–5.1)
Sodium: 135 mmol/L (ref 135–145)
Total Bilirubin: 2.3 mg/dL — ABNORMAL HIGH (ref 0.3–1.2)
Total Protein: 6.7 g/dL (ref 6.5–8.1)

## 2019-09-09 LAB — CBC WITH DIFFERENTIAL/PLATELET
Abs Immature Granulocytes: 0.27 10*3/uL — ABNORMAL HIGH (ref 0.00–0.07)
Basophils Absolute: 0.1 10*3/uL (ref 0.0–0.1)
Basophils Relative: 0 %
Eosinophils Absolute: 0.1 10*3/uL (ref 0.0–0.5)
Eosinophils Relative: 1 %
HCT: 46.2 % — ABNORMAL HIGH (ref 36.0–46.0)
Hemoglobin: 15.1 g/dL — ABNORMAL HIGH (ref 12.0–15.0)
Immature Granulocytes: 2 %
Lymphocytes Relative: 8 %
Lymphs Abs: 0.9 10*3/uL (ref 0.7–4.0)
MCH: 32 pg (ref 26.0–34.0)
MCHC: 32.7 g/dL (ref 30.0–36.0)
MCV: 97.9 fL (ref 80.0–100.0)
Monocytes Absolute: 0.4 10*3/uL (ref 0.1–1.0)
Monocytes Relative: 3 %
Neutro Abs: 10.3 10*3/uL — ABNORMAL HIGH (ref 1.7–7.7)
Neutrophils Relative %: 86 %
Platelets: 374 10*3/uL (ref 150–400)
RBC: 4.72 MIL/uL (ref 3.87–5.11)
RDW: 12.5 % (ref 11.5–15.5)
WBC: 12 10*3/uL — ABNORMAL HIGH (ref 4.0–10.5)
nRBC: 0.2 % (ref 0.0–0.2)

## 2019-09-09 LAB — C-REACTIVE PROTEIN: CRP: <0.5 mg/dL (ref <1.0)

## 2019-09-09 LAB — MAGNESIUM: Magnesium: 2.1 mg/dL (ref 1.7–2.4)

## 2019-09-09 LAB — D-DIMER, QUANTITATIVE: D-Dimer, Quant: 1.2 ug/mL-FEU — ABNORMAL HIGH (ref 0.00–0.50)

## 2019-09-09 LAB — BRAIN NATRIURETIC PEPTIDE: B Natriuretic Peptide: 38.8 pg/mL (ref 0.0–100.0)

## 2019-09-09 MED ORDER — ENOXAPARIN SODIUM 60 MG/0.6ML ~~LOC~~ SOLN
0.5000 mg/kg | SUBCUTANEOUS | Status: DC
  Administered 2019-09-10: 50 mg via SUBCUTANEOUS
  Filled 2019-09-09: qty 0.6

## 2019-09-09 NOTE — TOC Initial Note (Addendum)
Transition of Care [facility]) - Initial/Assessment Note    Patient Details  Name: Kristen Bender MRN: [id number] Date of Birth: 1955-09-21  Transition of Care [facility] CM/SW Contact:    Cherylann Parr, RN Phone Number: 09/09/2019, 2:07 PM  Clinical Narrative:    PTA independent from home from home with husband.  CM spoke with pt via phone - pt deferred all conversations to her daughter [name].  CM was able to reach [place].  Pts husband will provide 24 hour supervision at discharge.  CM informed daughter of recommendation for [facility] and DME.  [name] discussed recommendation with both pt and pts husband - all in agreement.  Pt does not have insurance nor PCP - [facility] and [facility] are the charity agencies.  CM provided referral of charity HH to [facility], and referral of charity home oxygen to [facility] (pending qualification note).  Pt will require pulm ambulatory test to qualify for oxygen - CM ordered test.   CM set up post discharge follow up televist appt with [facility] center - see AVS.  Pt may need MATCH at discharge  Both [facility] and [facility] to determine if pt qualifies for [facility] and DME.         Update 1650:  [facility]  can only staff PT for pt.  Both [facility] and [facility] accept pt via charity program          Expected Discharge Plan: Home w Home Health Services Barriers to Discharge: Barriers Resolved   Patient Goals and CMS Choice   CMS Medicare.gov Compare Post Acute Care list provided to:: Patient Choice offered to / list presented to : Adult Children, Patient  Expected Discharge Plan and Services Expected Discharge Plan: Home w Home Health Services     Post Acute Care Choice: Durable Medical Equipment, Home Health Living arrangements for the past 2 months: Single Family Home                 DME Arranged: Oxygen DME Agency: [facility] Date DME Agency Contacted: 09/09/19 Time DME Agency Contacted: 1406 Representative spoke with at DME Agency: zack HH Arranged: RN, PT HH Agency: [facility] Date HH Agency Contacted: 09/09/19 Time HH Agency Contacted: 1406 Representative spoke with at [facility] Agency: Lupita Leash  Prior Living Arrangements/Services Living arrangements for the past 2 months: Single Family Home Lives with:: Spouse Patient language and need for interpreter reviewed:: Yes Do you feel safe going back to the place where you live?: Yes      Need for Family Participation in Patient Care: Yes (Comment) Care giver support system in place?: Yes (comment)   Criminal Activity/Legal Involvement Pertinent to Current Situation/Hospitalization: No - Comment as needed  Activities of Daily Living Home Assistive Devices/Equipment: Eyeglasses ADL Screening (condition at time of admission) Patient's cognitive ability adequate to safely complete daily activities?: Yes Is the patient deaf or have difficulty hearing?: No Does the patient have difficulty seeing, even when wearing glasses/contacts?: No Does the patient have difficulty concentrating, remembering, or making decisions?: No Patient able to express need for assistance with ADLs?: Yes Does the patient have difficulty dressing or bathing?: No Independently performs ADLs?: Yes (appropriate for developmental age)(having increased shortness of breath) Does the patient have difficulty walking or climbing stairs?: Yes(secondary to shortness of breath) Weakness of Legs: Both Weakness of Arms/Hands: None  Permission Sought/Granted   Permission granted to share information with : Yes, Verbal Permission Granted              Emotional  Assessment       Orientation: : Oriented to Self      Admission diagnosis:  Pneumonia due to COVID-19 virus [U07.1, J12.82] Patient Active Problem List   Diagnosis Date Noted  . GERD (gastroesophageal reflux disease) 09/03/2019  . Pneumonia due to COVID-19 virus 09/02/2019  . Acid reflux 03/18/2016   PCP:  Scot Jun, FNP Pharmacy:   [facility] [address] [place], [place]. [place]. [place] [address] Phone: [phone] Fax: [phone]     Social Determinants of Health (SDOH) Interventions    Readmission Risk Interventions No flowsheet data found.

## 2019-09-09 NOTE — Plan of Care (Signed)
Problem: Respiratory:  Goal: Will maintain a patent airway  Outcome: Not Progressing  Goal: Complications related to the disease process, condition or treatment will be avoided or minimized  Outcome: Not Progressing

## 2019-09-09 NOTE — Progress Notes (Signed)
PROGRESS NOTE                                                                                                                                                                                                             Patient Demographics:    Kristen Bender, is a 64 y.o. female, DOB - 26-May-1956, TJQ:[id number]  Outpatient Primary MD for the patient is Scot Jun, FNP    LOS - 7  Admit date - 09/02/2019    Chief Complaint  Patient presents with  . Shortness of Breath       Brief Narrative - This is a 64 year old Portuguese/Spanish-speaking female with past medical history of GERD, IBS, anxiety, depression who tested positive for COVID-19 2 weeks ago and presented to [facility] with worsening shortness of breath, she was diagnosed with severe acute hypoxic respiratory failure due to COVID-19 pneumonia.  He was placed on high flow nasal cannula oxygen along with nonrebreather mask, she received IV steroids, remdesivir and Actemra at [facility] long hospital and was transferred to [facility] on 09/05/2019 for further care.   Subjective:   Patient in bed, appears comfortable, denies any headache, no fever, no chest pain or pressure, no shortness of breath , no abdominal pain. No focal weakness.   Assessment  & Plan :     1. Acute Hypoxic Resp. Failure due to Acute Covid 19 Viral Pneumonitis during the ongoing 2020 Covid 19 Pandemic - she has severe disease, currently on high flow nasal cannula oxygen along with nonrebreather mask, she has already received appropriate treatment with IV steroids, remdesivir and Actemra, continue steroid taper, advance activity and continue to titrate down oxygen as much as possible, she is down to 2 L now, if stable likely discharge on 09/10/2019 May require home oxygen upon discharge.  Encouraged the patient to sit up in chair in the daytime use I-S and flutter valve for pulmonary toiletry and then  prone in bed when at night.    SpO2: 91 % O2 Flow Rate (L/min): 2 L/min  Recent Labs  Lab 09/02/19 1913 09/03/19 0427 09/03/19 0433 09/03/19 0433 09/04/19 0450 09/04/19 0450 09/05/19 0428 09/06/19 0849 09/07/19 0356 09/08/19 0405 09/09/19 0713  CRP  --   --  5.6*   < > 10.8*   < > 7.1* 3.0* 1.4* 0.8 <0.5  DDIMER  --    < >  --    < >  1.25*  --  1.04* 0.90* 0.83* 1.39*  --   BNP 80.7  --   --   --   --   --   --  46.8 29.7 37.9  --   PROCALCITON  --   --  <0.10  --   --   --   --   --   --   --   --    < > = values in this interval not displayed.    Hepatic Function Latest Ref Rng & Units 09/09/2019 09/08/2019 09/07/2019  Total Protein 6.5 - 8.1 g/dL 6.7 6.4(L) 6.9  Albumin 3.5 - 5.0 g/dL 3.3(L) 3.0(L) 3.1(L)  AST 15 - 41 U/L 35 24 27  ALT 0 - 44 U/L 37 46(H) 55(H)  Alk Phosphatase 38 - 126 U/L 57 52 57  Total Bilirubin 0.3 - 1.2 mg/dL 2.3(H) 1.5(H) 1.5(H)     2.  Incidental finding of right lobe of liver cystic lesion.  Outpatient age-appropriate work-up.  3.  Extreme anxiety and depression.  Seen by psychiatry this admission, Zoloft dose increased, on Klonopin twice daily, much improved on 09/09/2019..  4.  Essential hypertension.  On Norvasc.     Condition - Extremely Guarded  Family Communication  :  Called daughter [name] on listed cell phone number [phone] on 09/06/2019 at 12:20 PM.  No response after over 15 rings, left detailed message on 09/07/2019 at 11:10 AM. Daughter x 2 09/08/19.  Updated again 09/09/2019.  Code Status :  Full  Diet :   Diet Order            Diet regular Room service appropriate? Yes; Fluid consistency: Thin  Diet effective now               Disposition Plan  : Stay in the hospital still getting treated for severe hypoxic respiratory failure due to COVID-19 pneumonia, continue titrating down oxygen, also being treated for severe panic attacks.  Consults  : Psych  Procedures  :    PUD Prophylaxis :   CTA - 1. No evidence  of acute pulmonary embolism. 2. Moderate severity diffuse areas of atelectasis and/or infiltrate bilaterally, slightly more prominent within the bilateral lung bases. 3. 3.7 cm cystic appearing lesion in the right lobe of the liver.  DVT Prophylaxis  :  Lovenox   Lab Results  Component Value Date   PLT 374 09/09/2019    Inpatient Medications  Scheduled Meds: . albuterol  2 puff Inhalation TID  . amLODipine  10 mg Oral Daily  . benzonatate  200 mg Oral TID  . clonazepam  1 mg Oral BID  . dexamethasone  2 mg Oral Q24H  . [START ON 09/10/2019] enoxaparin (LOVENOX) injection  0.5 mg/kg Subcutaneous Q24H  . loratadine  10 mg Oral Daily  . multivitamin  1 tablet Oral Daily  . sertraline  100 mg Oral Daily  . sodium chloride flush  3 mL Intravenous Q12H   Continuous Infusions:  PRN Meds:.acetaminophen, albuterol, chlorpheniramine-HYDROcodone, guaiFENesin-dextromethorphan, hydrALAZINE, LORazepam, oxymetazoline, sodium chloride  Antibiotics  :    Anti-infectives (From admission, onward)   Start     Dose/Rate Route Frequency Ordered Stop   09/04/19 1000  remdesivir 100 mg in sodium chloride 0.9 % 100 mL IVPB  Status:  Discontinued     100 mg 200 mL/hr over 30 Minutes Intravenous Daily 09/03/19 0304 09/03/19 0307   09/04/19 1000  remdesivir 100 mg in sodium chloride 0.9 % 100 mL IVPB  100 mg 200 mL/hr over 30 Minutes Intravenous Daily 09/02/19 2304 09/07/19 1021   09/03/19 0315  remdesivir 200 mg in sodium chloride 0.9% 250 mL IVPB  Status:  Discontinued     200 mg 580 mL/hr over 30 Minutes Intravenous Once 09/03/19 0304 09/03/19 0307   09/03/19 0000  remdesivir 100 mg in sodium chloride 0.9 % 100 mL IVPB     100 mg 200 mL/hr over 30 Minutes Intravenous Every 1 hr x 2 09/02/19 2304 09/03/19 0154   09/02/19 2100  cefTRIAXone (ROCEPHIN) 2 g in sodium chloride 0.9 % 100 mL IVPB  Status:  Discontinued     2 g 200 mL/hr over 30 Minutes Intravenous Every 24 hours 09/02/19 2032 09/03/19  0304   09/02/19 2100  azithromycin (ZITHROMAX) 500 mg in sodium chloride 0.9 % 250 mL IVPB  Status:  Discontinued     500 mg 250 mL/hr over 60 Minutes Intravenous Every 24 hours 09/02/19 2032 09/03/19 0304       Time Spent in minutes  30   Lala Lund M.D on 09/09/2019 at 10:28 AM  To page go to [email or web address] - password [email or web address]  [facility] -  Office  [phone]   See all Orders from today for further details    Objective:   Vitals:   09/08/19 2000 09/09/19 0100 09/09/19 0355 09/09/19 0742  BP: 112/79 (!) 113/56 115/70 107/70  Pulse: 87   80  Resp: 20 20  (!) 24  Temp: 98 F (36.7 C) 97.6 F (36.4 C) 98.1 F (36.7 C)   TempSrc: Oral Oral Oral Oral  SpO2: 90%   91%  Weight:      Height:        Wt Readings from Last 3 Encounters:  09/05/19 99.1 kg  03/17/16 75.3 kg     Intake/Output Summary (Last 24 hours) at 09/09/2019 1028 Last data filed at 09/09/2019 0826 Gross per 24 hour  Intake 3 ml  Output 1652 ml  Net -1649 ml     Physical Exam  Awake Alert, No new F.N deficits, anxious affect [place].AT,PERRAL Supple Neck,No JVD, No cervical lymphadenopathy appriciated.  Symmetrical Chest wall movement, Good air movement bilaterally, CTAB RRR,No Gallops, Rubs or new Murmurs, No Parasternal Heave +ve B.Sounds, Abd Soft, No tenderness, No organomegaly appriciated, No rebound - guarding or rigidity. No Cyanosis, Clubbing or edema, No new Rash or bruise     Data Review:    CBC Recent Labs  Lab 09/04/19 0450 09/04/19 1103 09/05/19 0428 09/06/19 0849 09/07/19 0356 09/08/19 0405 09/09/19 0916  WBC 6.3  --  6.7 9.0 9.8 9.2 12.0*  HGB 8.5*   < > 14.2 14.5 13.7 13.3 15.1*  HCT 27.7*   < > 45.0 44.7 42.4 40.9 46.2*  PLT 203  --  217 392 399 363 374  MCV 104.1*  --  101.4* 98.7 98.4 97.6 97.9  MCH 32.0  --  32.0 32.0 31.8 31.7 32.0  MCHC 30.7  --  31.6 32.4 32.3 32.5 32.7  RDW 12.8  --  12.6 12.5 12.4 12.4 12.5  LYMPHSABS 0.6*  --  1.7  --  1.8 1.4 0.9    MONOABS 0.6  --  0.4  --  0.5 0.4 0.4  EOSABS 0.0  --  0.0  --  0.1 0.1 0.1  BASOSABS 0.0  --  0.0  --  0.0 0.0 0.1   < > = values in this interval not displayed.    Chemistries  Recent Labs  Lab  0000 09/02/19 1912 09/03/19 0427 09/03/19 0433 09/04/19 1103 09/04/19 1103 09/05/19 0428 09/06/19 0849 09/07/19 0356 09/08/19 0405 09/09/19 0713  NA   < > 140  --   --  140   < > 141 138 137 135 135  K   < > 3.7  --   --  3.8   < > 3.7 4.3 4.1 4.0 4.8  CL   < > 105  --   --  103   < > 104 99 98 98 97*  CO2   < > 22  --   --  27   < > _0 GLUCOSE   < > 183*  --   --  177*   < > 103* 137* 102* 106* 116*  BUN   < > 15  --   --  16   < > _1 25*  CREATININE  --  0.89  --    < > 0.61   < > 0.65 0.67 0.76 0.67 0.77  CALCIUM   < > 9.5  --   --  9.1   < > 8.8* 9.3 9.2 8.9 9.2  AST  --  38  --   --  31  --   --   --  27 24 35  ALT  --  59*  --   --  61*  --   --   --  55* 46* 37  ALKPHOS  --  65  --   --  58  --   --   --  57 52 57  BILITOT  --  0.9  --   --  1.1  --   --   --  1.5* 1.5* 2.3*  MG  --   --  2.0   < >  --   --  2.5* 2.3 2.2 2.1 2.1  INR  --  1.0  --   --   --   --   --   --   --   --   --   HGBA1C  --   --  5.2  --   --   --   --   --   --   --   --    < > = values in this interval not displayed.     ------------------------------------------------------------------------------------------------------------------ No results for input(s): CHOL, HDL, LDLCALC, TRIG, CHOLHDL, LDLDIRECT in the last 72 hours.  Lab Results  Component Value Date   HGBA1C 5.2 09/03/2019   ------------------------------------------------------------------------------------------------------------------ No results for input(s): TSH, T4TOTAL, T3FREE, THYROIDAB in the last 72 hours.  Invalid input(s): FREET3  Cardiac Enzymes No results for input(s): CKMB, TROPONINI, MYOGLOBIN in the last 168 hours.  Invalid input(s):  CK ------------------------------------------------------------------------------------------------------------------    Component Value Date/Time   BNP 37.9 09/08/2019 0405    Micro Results Recent Results (from the past 240 hour(s))  Culture, blood (x 2)     Status: None   Collection Time: 09/02/19  9:57 PM   Specimen: BLOOD  Result Value Ref Range Status   Specimen Description   Final    BLOOD LEFT ANTECUBITAL Performed at [facility] [address], [place], [place] [address]    Special Requests   Final    BOTTLES DRAWN AEROBIC AND ANAEROBIC Blood Culture adequate volume Performed at [facility] [address] [place], [place] [address]    Culture   Final  NO GROWTH 5 DAYS Performed at [facility], [place] [address], [place], [place] [address]    Report Status 09/08/2019 FINAL  Final  Culture, blood (x 2)     Status: None   Collection Time: 09/02/19  9:57 PM   Specimen: BLOOD RIGHT HAND  Result Value Ref Range Status   Specimen Description BLOOD RIGHT HAND  Final   Special Requests AEROBIC BOTTLE ONLY Blood Culture adequate volume  Final   Culture   Final    NO GROWTH 5 DAYS Performed at [facility], [place] [address], [place], [place] [address]    Report Status 09/08/2019 FINAL  Final    Radiology Reports CT Angio Chest PE W and/or Wo Contrast  Result Date: 09/02/2019 CLINICAL DATA:  Shortness of breath. EXAM: CT ANGIOGRAPHY CHEST WITH CONTRAST TECHNIQUE: Multidetector CT imaging of the chest was performed using the standard protocol during bolus administration of intravenous contrast. Multiplanar CT image reconstructions and MIPs were obtained to evaluate the vascular anatomy. CONTRAST:  160m OMNIPAQUE IOHEXOL 350 MG/ML SOLN COMPARISON:  None. FINDINGS: Cardiovascular: Satisfactory opacification of the pulmonary arteries to the segmental level. No evidence of pulmonary embolism. Normal heart size. No pericardial  effusion. Mediastinum/Nodes: No enlarged mediastinal, hilar, or axillary lymph nodes. Thyroid gland, trachea, and esophagus demonstrate no significant findings. Lungs/Pleura: Moderate severity diffuse areas of atelectasis and/or infiltrate are seen bilaterally, slightly more prominent within the bilateral lung bases. There is no evidence of a pleural effusion or pneumothorax. Upper Abdomen: A 3.7 cm x 2.1 cm cystic appearing areas seen within the posteromedial aspect of the right lobe of the liver. Musculoskeletal: Multilevel degenerative changes seen throughout the thoracic spine. Review of the MIP images confirms the above findings. IMPRESSION: 1. No evidence of acute pulmonary embolism. 2. Moderate severity diffuse areas of atelectasis and/or infiltrate bilaterally, slightly more prominent within the bilateral lung bases. 3. 3.7 cm cystic appearing lesion in the right lobe of the liver. Electronically Signed   By: TVirgina NorfolkM.D.   On: 09/02/2019 21:28   DG Chest Port 1 View  Result Date: 09/08/2019 CLINICAL DATA:  COVID-19 positive.  Shortness of breath. EXAM: PORTABLE CHEST 1 VIEW COMPARISON:  September 06, 2019 FINDINGS: Bilateral pulmonary infiltrates are stable on the left and in the right base in the interval. The heart, hila, mediastinum, lungs, and pleura are otherwise unchanged. IMPRESSION: Stable bilateral pulmonary infiltrates. Electronically Signed   By: DDorise BullionIII M.D   On: 09/08/2019 10:23   DG Chest Port 1 View  Result Date: 09/06/2019 CLINICAL DATA:  Shortness of breath EXAM: PORTABLE CHEST 1 VIEW COMPARISON:  09/02/2019 plain film and CT FINDINGS: Midline trachea. Mild cardiomegaly. Given differences in technique, similar left greater than right peripheral and basilar predominant interstitial opacities. Mild right hemidiaphragm elevation. No pleural effusion or pneumothorax. IMPRESSION: No significant change in basilar and peripheral predominant interstitial opacities, most  consistent with COVID-19 pneumonia. Electronically Signed   By: KAbigail MiyamotoM.D.   On: 09/06/2019 08:47   DG Chest Port 1 View  Result Date: 09/02/2019 CLINICAL DATA:  Short of breath. EXAM: PORTABLE CHEST 1 VIEW COMPARISON:  None FINDINGS: Cardiac enlargement. Decreased lung volumes. Bilateral peripheral and lower lobe predominant opacities are identified. No pleural effusion or edema. IMPRESSION: Bilateral peripheral and lower lobe predominant opacities, which in the acute setting are concerning for multifocal pneumonia. Electronically Signed   By: TKerby MoorsM.D.   On: 09/02/2019 19:51

## 2019-09-09 NOTE — Progress Notes (Signed)
Occupational Therapy Treatment Patient Details Name: Kristen Bender MRN: [id number] DOB: 01-16-1956 Today's Date: 09/09/2019    History of present illness 64 y.o. female Spanish speaking with medical history significant of IBS, GERD, anxiety, depression, allergies who presents for 1 day of SOB.  She notes that she developed symptoms including cough about 2 weeks ago.  She was tested and resulted positive for COVID 19 8 days ago per report in the ED. Chest x-ray showsBilateral peripheral and lower lobe predominant opacities, which in the acute setting are concerning for multifocal pneumonia. Admitted 09/02/19.   OT comments  Pt with major improvements in cardiopulmonary tolerance for daily tasks. Pt received on 2 L O2, 89-90% at rest. Increased pt to 3 L O2 for activity. Pt Independent for donning socks while seated in recliner, setup for washing face.  Pt Supervision for sit to stand with RW. Guided pt in mobility to/from sink with RW at min guard. Once around foot of bed, pt with increasing anxiety. Pt noted to close eyes, but continue advancing RW. Pt required max safety cues to open eyes if continuing with movement. Pt stood at sink, but reported desire to return back to recliner chair prior to sink tasks. When around foot of bed, pt started to back up with RW. OT provided max safety cues to advance forward with RW to recliner until close enough for safe pivot. However, pt continued to back up with RW. Lowest desats to 83% on 3 L O2, but not sustained. Pt maintained O2 upper 80s-low 90s throughout. Based on improvements in O2 for activity, update recommendation to [facility] with 24 hour supervision/assistance. Reinforced need for family to be at home with pt because when anxiety increases, safety awareness decreases significantly. Will continue to follow acutely.   Follow Up Recommendations  Home health OT;Supervision/Assistance - 24 hour    Equipment Recommendations  3 in 1 bedside commode;Other  (comment)(RW)    Recommendations for Other Services      Precautions / Restrictions Precautions Precautions: Fall;Other (comment) Precaution Comments: anxiety Restrictions Weight Bearing Restrictions: No       Mobility Bed Mobility               General bed mobility comments: received in recliner chair   Transfers Overall transfer level: Needs assistance Equipment used: Rolling walker (2 wheeled) Transfers: Sit to/from Stand Sit to Stand: Supervision         General transfer comment: Supervision for initial sit to stand     Balance Overall balance assessment: Needs assistance Sitting-balance support: Feet supported;Bilateral upper extremity supported;Single extremity supported Sitting balance-Leahy Scale: Good     Standing balance support: Bilateral upper extremity supported Standing balance-Leahy Scale: Fair                             ADL either performed or assessed with clinical judgement   ADL Overall ADL's : Needs assistance/impaired     Grooming: Set up;Wash/dry face               Lower Body Dressing: Supervision/safety;Sitting/lateral leans Lower Body Dressing Details (indicate cue type and reason): Supervision to don socks sitting in recliner chair              Functional mobility during ADLs: Min guard;Rolling walker General ADL Comments: Pt inquired in use of RW, min guard to supervision for mobility to/from sink     Vision       Perception  Praxis      Cognition Arousal/Alertness: Awake/alert Behavior During Therapy: Anxious Overall Cognitive Status: Within Functional Limits for tasks assessed                                 General Comments: pt very anxious when on feet today. Pt noted to close eyes during mobility, etc decreased overall safety        Exercises Exercises: Other exercises Other Exercises Other Exercises: pursed lip breathing    Shoulder Instructions       General  Comments Pt received on 2 L O2 at 89-90%. Increased to 3 L O2 for activity with lowest stats to 83% but not sustained. Maintained in high 80s-low 90s on 3 L O2. Pt noted with increasing anxiety and decreased following of safety commands increasing fall risk.     Pertinent Vitals/ Pain       Pain Assessment: No/denies pain  Home Living                                          Prior Functioning/Environment              Frequency  Min 3X/week        Progress Toward Goals  OT Goals(current goals can now be found in the care plan section)  Progress towards OT goals: Progressing toward goals  Acute Rehab OT Goals Patient Stated Goal: decrease anxiety OT Goal Formulation: With patient Time For Goal Achievement: 09/20/19 Potential to Achieve Goals: Good ADL Goals Pt Will Perform Grooming: with supervision;standing Pt Will Perform Upper Body Bathing: with supervision;sitting Pt Will Perform Lower Body Bathing: with min guard assist;sit to/from stand Pt Will Transfer to Toilet: with supervision;stand pivot transfer;bedside commode Pt Will Perform Toileting - Clothing Manipulation and hygiene: with supervision;sit to/from stand Additional ADL Goal #1: Pt to demonstrate implementation of 3 energy conservation strategies during ADLs in order to maximize independence  Plan Discharge plan needs to be updated    Co-evaluation                 AM-PAC OT "6 Clicks" Daily Activity     Outcome Measure   Help from another person eating meals?: None Help from another person taking care of personal grooming?: A Little Help from another person toileting, which includes using toliet, bedpan, or urinal?: A Lot Help from another person bathing (including washing, rinsing, drying)?: A Lot Help from another person to put on and taking off regular upper body clothing?: A Little Help from another person to put on and taking off regular lower body clothing?: A Little 6  Click Score: 17    End of Session Equipment Utilized During Treatment: Gait belt;Rolling walker;Oxygen  OT Visit Diagnosis: Unsteadiness on feet (R26.81);Other (comment)   Activity Tolerance Patient tolerated treatment well;Other (comment)(Limited by anxiety)   Patient Left in chair;with call bell/phone within reach   Nurse Communication Mobility status        Time: 8309-4076 OT Time Calculation (min): 24 min  Charges: OT General Charges $OT Visit: 1 Visit OT Treatments $Self Care/Home Management : 8-22 mins $Therapeutic Activity: 8-22 mins  Lorre Munroe, OTR/L   Lorre Munroe 09/09/2019, 2:30 PM

## 2019-09-10 LAB — CBC WITH DIFFERENTIAL/PLATELET
Abs Immature Granulocytes: 0.23 10*3/uL — ABNORMAL HIGH (ref 0.00–0.07)
Basophils Absolute: 0 10*3/uL (ref 0.0–0.1)
Basophils Relative: 0 %
Eosinophils Absolute: 0.3 10*3/uL (ref 0.0–0.5)
Eosinophils Relative: 3 %
HCT: 40.6 % (ref 36.0–46.0)
Hemoglobin: 13.2 g/dL (ref 12.0–15.0)
Immature Granulocytes: 2 %
Lymphocytes Relative: 16 %
Lymphs Abs: 1.9 10*3/uL (ref 0.7–4.0)
MCH: 31.6 pg (ref 26.0–34.0)
MCHC: 32.5 g/dL (ref 30.0–36.0)
MCV: 97.1 fL (ref 80.0–100.0)
Monocytes Absolute: 0.6 10*3/uL (ref 0.1–1.0)
Monocytes Relative: 5 %
Neutro Abs: 8.8 10*3/uL — ABNORMAL HIGH (ref 1.7–7.7)
Neutrophils Relative %: 74 %
Platelets: 312 10*3/uL (ref 150–400)
RBC: 4.18 MIL/uL (ref 3.87–5.11)
RDW: 12.6 % (ref 11.5–15.5)
WBC: 11.9 10*3/uL — ABNORMAL HIGH (ref 4.0–10.5)
nRBC: 0.3 % — ABNORMAL HIGH (ref 0.0–0.2)

## 2019-09-10 LAB — COMPREHENSIVE METABOLIC PANEL
ALT: 35 U/L (ref 0–44)
AST: 25 U/L (ref 15–41)
Albumin: 3.1 g/dL — ABNORMAL LOW (ref 3.5–5.0)
Alkaline Phosphatase: 49 U/L (ref 38–126)
Anion gap: 11 (ref 5–15)
BUN: 25 mg/dL — ABNORMAL HIGH (ref 8–23)
CO2: 25 mmol/L (ref 22–32)
Calcium: 9.2 mg/dL (ref 8.9–10.3)
Chloride: 99 mmol/L (ref 98–111)
Creatinine, Ser: 0.8 mg/dL (ref 0.44–1.00)
GFR calc Af Amer: >60 mL/min (ref >60)
GFR calc non Af Amer: >60 mL/min (ref >60)
Glucose, Bld: 105 mg/dL — ABNORMAL HIGH (ref 70–99)
Potassium: 3.7 mmol/L (ref 3.5–5.1)
Sodium: 135 mmol/L (ref 135–145)
Total Bilirubin: 1.3 mg/dL — ABNORMAL HIGH (ref 0.3–1.2)
Total Protein: 6.4 g/dL — ABNORMAL LOW (ref 6.5–8.1)

## 2019-09-10 LAB — D-DIMER, QUANTITATIVE: D-Dimer, Quant: 1.18 ug/mL-FEU — ABNORMAL HIGH (ref 0.00–0.50)

## 2019-09-10 LAB — BRAIN NATRIURETIC PEPTIDE: B Natriuretic Peptide: 62.5 pg/mL (ref 0.0–100.0)

## 2019-09-10 LAB — C-REACTIVE PROTEIN: CRP: 0.6 mg/dL (ref <1.0)

## 2019-09-10 LAB — MAGNESIUM: Magnesium: 2.1 mg/dL (ref 1.7–2.4)

## 2019-09-10 MED ORDER — SERTRALINE HCL 100 MG PO TABS: 100.0000 mg | ORAL_TABLET | Freq: Every day | ORAL | 0 refills | Status: DC

## 2019-09-10 NOTE — TOC Transition Note (Addendum)
Transition of Care [facility]) - CM/SW Discharge Note   Patient Details  Name: Kristen Bender MRN: [id number] Date of Birth: 07-21-1955  Transition of Care [facility]) CM/SW Contact:  Cherylann Parr, RN Phone Number: 09/10/2019, 1:50 PM   Clinical Narrative:   Pt deemed stable for discharge home today .  [facility] can provide HHPT only due to staffing constraints.  [facility] agency of charity when referral was made.  CM contacted charity agency starting today - [facility] can not staff [facility] either and therefore pt will discharge home with HHPT provided by [facility].  [facility] will follow pt and add HHRN in the future if staffing becomes available.   Attending aware of HH constraint.   [facility] will provide portable oxygen tanks for transport home - pt will need to remain in [facility] facility until home oxygen equipment is confirmed delivered.  CM also made covid at home referral via email - program will provide either televisit or pt home visit post discharge for additional support.  Discharge order signed.  Pt not started on any new meds/just modification per AVS.  Attending sent  New prescription to [facility] - daughter will use goodrx coupon and therefore MATCH is not required..    CM informed pt needs additonal DME - pts daughter in agreement.  Charity referral given to [facility] (RW and 3:1).  [facility] will deliver all home equipment directly to the home. Daughter requesting [facility] transport home - address verified.  CM will print transport pkg to nurse station and bedside nurse can call transport once home equipment is confirmed.   CM provided [facility] and COVID at home pts cell phone number [phone]  NO other CM needs determined - CM signing off  Update:  CM informed that oxygen is now in the home.  Unit made aware   Final next level of care: Home w Home Health Services Barriers to Discharge: Barriers Resolved   Patient Goals and CMS Choice   CMS Medicare.gov Compare Post Acute Care list provided to:: Patient Choice offered to /  list presented to : Adult Children  Discharge Placement                Patient to be transferred to facility by: [facility] Name of family member notified: Daughter [name] Patient and family notified of of transfer: 09/10/19  Discharge Plan and Services     Post Acute Care Choice: Durable Medical Equipment, Home Health          DME Arranged: 3-N-1, [place] rolling DME Agency: [facility] Date DME Agency Contacted: 09/10/19 Time DME Agency Contacted: 1000 Representative spoke with at DME Agency: Zack HH Arranged: RN, PT HH Agency: [facility] Date HH Agency Contacted: 09/09/19 Time HH Agency Contacted: [phone] Representative spoke with at [facility] Agency: Lupita Leash  Social Determinants of Health (SDOH) Interventions     Readmission Risk Interventions No flowsheet data found.

## 2019-09-10 NOTE — Progress Notes (Signed)
Physical Therapy Treatment Patient Details Name: Kristen Bender MRN: [id number] DOB: 07-14-1955 Today's Date: 09/10/2019    History of Present Illness 64 year old female with medical history significant of IBS, GERD, anxiety, depression, allergies who presents for 1 day of SOB.  She notes that she developed symptoms including cough about 2 weeks ago.  She was tested and resulted positive for COVID 19 8 days ago per report in the ED. Chest x-ray showsBilateral peripheral and lower lobe predominant opacities, which in the acute setting are concerning for multifocal pneumonia. Admitted 09/02/19.    PT Comments    Patient limited by anxiety (despite premedication prior to session) and desaturation with mobility despite use of supplemental oxygen. Patient does not demonstrate the activity tolerance needed to negotiate the stairs to enter her apartment. She reports only 5 STE (PT eval notes 15 steps) and that her husband will carry her up the steps. Education on need to practice stair negotiation prior to discharge but patient declined. Recommend RW, BSC, home PT, 24/7 assistance, and ideally ambulance/chair Lucianne Lei to transport patient into the home as she desats with just short distance ambulation in room despite 3L oxygen.   Follow Up Recommendations  Home health PT;Supervision/Assistance - 24 hour((assistance up stairs to enter))     Equipment Recommendations  Rolling walker with 5" wheels;3in1 (PT)       Precautions / Restrictions Precautions Precautions: Fall;Other (comment) Precaution Comments: anxiety Restrictions Weight Bearing Restrictions: No    Mobility  Bed Mobility Overal bed mobility: Modified Independent Bed Mobility: Supine to Sit     Supine to sit: HOB elevated((HOB approx 20 degrees))     General bed mobility comments: Encouraged patient to not use bedrail.  Transfers Overall transfer level: Needs assistance Equipment used: Rolling walker (2 wheeled) Transfers: Sit  to/from [facility] Sit to Stand: Supervision Stand pivot transfers: Supervision       General transfer comment: Cues for hand placement. Stand-step transfer EOB?chair, sit<>stand from chair  Ambulation/Gait Ambulation/Gait assistance: Supervision Gait Distance (Feet): 12 Feet Assistive device: Rolling walker (2 wheeled) Gait Pattern/deviations: Step-through pattern;Decreased step length - left;Decreased step length - right     General Gait Details: Education to not walk backwards. When patient becomes anxious and oxygen saturation in the 80s she increases her mobility speed and decreases safety awareness.   Stairs Stairs: (patient declined practicing stair negotiation )        Balance Overall balance assessment: Needs assistance Sitting-balance support: Feet unsupported;Bilateral upper extremity supported Sitting balance-Leahy Scale: Good     Standing balance support: No upper extremity supported;Bilateral upper extremity supported Standing balance-Leahy Scale: Fair     Cognition Arousal/Alertness: Awake/alert Behavior During Therapy: Impulsive((when anxious))       General Comments: Patient reporting she took medicine for anxiety and it makes her tired.      Exercises Other Exercises Other Exercises: flutter x 10 Other Exercises: incentive spirometer x 10 (max 376    General Comments General comments (skin integrity, edema, etc.): On 2L [place] at rest, 3L for mobility, desats to 79% and RR 49 with short distance ambulation in room. Education on safety with mobility and O2 tube mgmt. PT managed O2 tube and tank during session. (recovers with seated rest in less than 1 minute)      Pertinent Vitals/Pain Pain Assessment: No/denies pain(No complaints of pain, no signs/symptoms of pain)           PT Goals (current goals can now be found in the care  plan section) Progress towards PT goals: Progressing toward goals    Frequency    Min  3X/week      PT Plan Current plan remains appropriate       AM-PAC PT "6 Clicks" Mobility   Outcome Measure  Help needed turning from your back to your side while in a flat bed without using bedrails?: None Help needed moving from lying on your back to sitting on the side of a flat bed without using bedrails?: None Help needed moving to and from a bed to a chair (including a wheelchair)?: A Little Help needed standing up from a chair using your arms (e.g., wheelchair or bedside chair)?: A Little Help needed to walk in hospital room?: A Little Help needed climbing 3-5 steps with a railing? : A Little 6 Click Score: 20    End of Session Equipment Utilized During Treatment: Oxygen Activity Tolerance: Other (comment)(limited by anxiety and desaturation) Patient left: in chair;with call bell/phone within reach Nurse Communication: Other (comment)(CM informed of PT recommendations via secure chat) PT Visit Diagnosis: Muscle weakness (generalized) (M62.81);Difficulty in walking, not elsewhere classified (R26.2)     Time: 0254-2706 PT Time Calculation (min) (ACUTE ONLY): 27 min  Charges:  $Therapeutic Activity: 23-37 mins                     Angelene Giovanni, PT, DPT Acute Rehab [phone] office     Angelene Giovanni 09/10/2019, 11:08 AM

## 2019-09-10 NOTE — Discharge Instructions (Signed)
Follow with Primary MD Scot Jun, FNP in 7 days   Get CBC, CMP, 2 view Chest X ray -  checked next visit within 1 week by Primary MD    Activity: As tolerated with Full fall precautions use walker/cane & assistance as needed  Disposition Home    Diet: Heart Healthy    Special Instructions: If you have smoked or chewed Tobacco  in the last 2 yrs please stop smoking, stop any regular Alcohol  and or any Recreational drug use.  On your next visit with your primary care physician please Get Medicines reviewed and adjusted.  Please request your Prim.MD to go over all Hospital Tests and Procedure/Radiological results at the follow up, please get all Hospital records sent to your Prim MD by signing hospital release before you go home.  If you experience worsening of your admission symptoms, develop shortness of breath, life threatening emergency, suicidal or homicidal thoughts you must seek medical attention immediately by calling 911 or calling your MD immediately  if symptoms less severe.  You Must read complete instructions/literature along with all the possible adverse reactions/side effects for all the Medicines you take and that have been prescribed to you. Take any new Medicines after you have completely understood and accpet all the possible adverse reactions/side effects.   Do not drive, operate heavy machinery, perform activities at heights, swimming or participation in water activities or provide baby sitting services if your were admitted for syncope or siezures until you have seen by Primary MD or a Neurologist and advised to do so again.     Person Under Monitoring Name: Kristen Bender  Location: [address] [place] [address]   Infection Prevention Recommendations for Individuals Confirmed to have, or Being Evaluated for, 2019 Novel Coronavirus (COVID-19) Infection Who Receive Care at Home  Individuals who are confirmed to have, or are being  evaluated for, COVID-19 should follow the prevention steps below until a healthcare provider or local or state health department says they can return to normal activities.  Stay home except to get medical care You should restrict activities outside your home, except for getting medical care. Do not go to work, school, or public areas, and do not use public transportation or taxis.  Call ahead before visiting your doctor Before your medical appointment, call the healthcare provider and tell them that you have, or are being evaluated for, COVID-19 infection. This will help the healthcare provider's office take steps to keep other people from getting infected. Ask your healthcare provider to call the local or state health department.  Monitor your symptoms Seek prompt medical attention if your illness is worsening (e.g., difficulty breathing). Before going to your medical appointment, call the healthcare provider and tell them that you have, or are being evaluated for, COVID-19 infection. Ask your healthcare provider to call the local or state health department.  Wear a facemask You should wear a facemask that covers your nose and mouth when you are in the same room with other people and when you visit a healthcare provider. People who live with or visit you should also wear a facemask while they are in the same room with you.  Separate yourself from other people in your home As much as possible, you should stay in a different room from other people in your home. Also, you should use a separate bathroom, if available.  Avoid sharing household items You should not share dishes, drinking glasses, cups, eating utensils, towels,  bedding, or other items with other people in your home. After using these items, you should wash them thoroughly with soap and water.  Cover your coughs and sneezes Cover your mouth and nose with a tissue when you cough or sneeze, or you can cough or sneeze into your  sleeve. Throw used tissues in a lined trash can, and immediately wash your hands with soap and water for at least 20 seconds or use an alcohol-based hand rub.  Wash your [employer] your hands often and thoroughly with soap and water for at least 20 seconds. You can use an alcohol-based hand sanitizer if soap and water are not available and if your hands are not visibly dirty. Avoid touching your eyes, nose, and mouth with unwashed hands.   Prevention Steps for Caregivers and Household Members of Individuals Confirmed to have, or Being Evaluated for, COVID-19 Infection Being Cared for in the Home  If you live with, or provide care at home for, a person confirmed to have, or being evaluated for, COVID-19 infection please follow these guidelines to prevent infection:  Follow healthcare provider's instructions Make sure that you understand and can help the patient follow any healthcare provider instructions for all care.  Provide for the patient's basic needs You should help the patient with basic needs in the home and provide support for getting groceries, prescriptions, and other personal needs.  Monitor the patient's symptoms If they are getting sicker, call his or her medical provider and tell them that the patient has, or is being evaluated for, COVID-19 infection. This will help the healthcare provider's office take steps to keep other people from getting infected. Ask the healthcare provider to call the local or state health department.  Limit the number of people who have contact with the patient  If possible, have only one caregiver for the patient.  Other household members should stay in another home or place of residence. If this is not possible, they should stay  in another room, or be separated from the patient as much as possible. Use a separate bathroom, if available.  Restrict visitors who do not have an essential need to be in the home.  Keep older adults, very  young children, and other sick people away from the patient Keep older adults, very young children, and those who have compromised immune systems or chronic health conditions away from the patient. This includes people with chronic heart, lung, or kidney conditions, diabetes, and cancer.  Ensure good ventilation Make sure that shared spaces in the home have good air flow, such as from an air conditioner or an opened window, weather permitting.  Wash your hands often  Wash your hands often and thoroughly with soap and water for at least 20 seconds. You can use an alcohol based hand sanitizer if soap and water are not available and if your hands are not visibly dirty.  Avoid touching your eyes, nose, and mouth with unwashed hands.  Use disposable paper towels to dry your hands. If not available, use dedicated cloth towels and replace them when they become wet.  Wear a facemask and gloves  Wear a disposable facemask at all times in the room and gloves when you touch or have contact with the patient's blood, body fluids, and/or secretions or excretions, such as sweat, saliva, sputum, nasal mucus, vomit, urine, or feces.  Ensure the mask fits over your nose and mouth tightly, and do not touch it during use.  Throw out disposable facemasks and  gloves after using them. Do not reuse.  Wash your hands immediately after removing your facemask and gloves.  If your personal clothing becomes contaminated, carefully remove clothing and launder. Wash your hands after handling contaminated clothing.  Place all used disposable facemasks, gloves, and other waste in a lined container before disposing them with other household waste.  Remove gloves and wash your hands immediately after handling these items.  Do not share dishes, glasses, or other household items with the patient  Avoid sharing household items. You should not share dishes, drinking glasses, cups, eating utensils, towels, bedding, or other  items with a patient who is confirmed to have, or being evaluated for, COVID-19 infection.  After the person uses these items, you should wash them thoroughly with soap and water.  Wash laundry thoroughly  Immediately remove and wash clothes or bedding that have blood, body fluids, and/or secretions or excretions, such as sweat, saliva, sputum, nasal mucus, vomit, urine, or feces, on them.  Wear gloves when handling laundry from the patient.  Read and follow directions on labels of laundry or clothing items and detergent. In general, wash and dry with the warmest temperatures recommended on the label.  Clean all areas the individual has used often  Clean all touchable surfaces, such as counters, tabletops, doorknobs, bathroom fixtures, toilets, phones, keyboards, tablets, and bedside tables, every day. Also, clean any surfaces that may have blood, body fluids, and/or secretions or excretions on them.  Wear gloves when cleaning surfaces the patient has come in contact with.  Use a diluted bleach solution (e.g., dilute bleach with 1 part bleach and 10 parts water) or a household disinfectant with a label that says EPA-registered for coronaviruses. To make a bleach solution at home, add 1 tablespoon of bleach to 1 quart (4 cups) of water. For a larger supply, add  cup of bleach to 1 gallon (16 cups) of water.  Read labels of cleaning products and follow recommendations provided on product labels. Labels contain instructions for safe and effective use of the cleaning product including precautions you should take when applying the product, such as wearing gloves or eye protection and making sure you have good ventilation during use of the product.  Remove gloves and wash hands immediately after cleaning.  Monitor yourself for signs and symptoms of illness Caregivers and household members are considered close contacts, should monitor their health, and will be asked to limit movement outside of  the home to the extent possible. Follow the monitoring steps for close contacts listed on the symptom monitoring form.   ? If you have additional questions, contact your local health department or call the epidemiologist on call at [phone] (available 24/7). ? This guidance is subject to change. For the most up-to-date guidance from [facility], please refer to their website: [email or web address]

## 2019-09-10 NOTE — Progress Notes (Signed)
SATURATION QUALIFICATIONS: (This note is used to comply with regulatory documentation for home oxygen)  Patient Saturations on Room Air at Rest = 90%  Patient Saturations on Room Air while Ambulating = 83%  Patient Saturations on 3 Liters of oxygen while Ambulating = 92%  Please briefly explain why patient needs home oxygen:  Pt's o2 before ambulation was 90%.  Pt walked from chair to door.  Pt got really anxious.  Pt had to be instructed to open her eyes to see where she was walking to.  Pt desatted to 83% on room air.  2L of oxygen applied to patient and patient's oxygen level increased to 83-87%.  Pt increased to 3L [place].  Pt's o2 on 3L [place] was 89-92%.  Pt was then returned back to her chair.  Pt's o2 decreased to 2L [place].  Pt became less anxious when she sat down and her oxygen level increased to 92% two minutes post ambulation.

## 2019-09-10 NOTE — Discharge Summary (Signed)
Kristen Bender HDQ:[id number] DOB: 09/22/1955 DOA: 09/02/2019  PCP: Scot Jun, FNP  Admit date: 09/02/2019  Discharge date: 09/10/2019  Admitted From: Home   Disposition:  Home   Recommendations for Outpatient Follow-up:   Follow up with PCP in 1-2 weeks  PCP Please obtain BMP/CBC, 2 view CXR in 1week,  (see Discharge instructions)   PCP Please follow up on the following pending results: Review CT results below, needs outpatient GI follow-up in 1 to 2 weeks for evaluation of the cystic lesion in the right lobe of the liver.   Home Health: PT,RN   Equipment/Devices: 2lit o2  Consultations: Psych Discharge Condition: Stable    CODE STATUS: Full    Diet Recommendation: Heart Healthy   Diet Order            Diet - low sodium heart healthy        Diet regular Room service appropriate? Yes; Fluid consistency: Thin  Diet effective now               Chief Complaint  Patient presents with  . Shortness of Breath     Brief history of present illness from the day of admission and additional interim summary    This is a 64 year old Portuguese/Spanish-speaking female with past medical history of GERD, IBS, anxiety, depression who tested positive for COVID-19 2 weeks ago and presented to [facility] with worsening shortness of breath, she was diagnosed with severe acute hypoxic respiratory failure due to COVID-19 pneumonia.  He was placed on high flow nasal cannula oxygen along with nonrebreather mask, she received IV steroids, remdesivir and Actemra at [facility] long hospital and was transferred to [facility] on 09/05/2019 for further care.                                                                 Hospital Course   1. Acute Hypoxic Resp. Failure due to Acute Covid 19 Viral Pneumonitis during the ongoing 2020  Covid 19 Pandemic - she has severe disease, currently on high flow nasal cannula oxygen along with nonrebreather mask, she has already received appropriate treatment with IV steroids, remdesivir and Actemra, has finished her steroid taper as well, advance activity and continue to titrate down oxygen as much as possible, she is down to 2 L now, stable and symptom-free this morning will be discharged home on 2 L nasal cannula oxygen.  She has finished all her treatments at this time.Marland Kitchen  SpO2: 90 % O2 Flow Rate (L/min): 2 L/min  Recent Labs  Lab 09/04/19 0450 09/04/19 0450 09/05/19 0428 09/05/19 0428 09/06/19 0849 09/07/19 0356 09/08/19 0405 09/09/19 0713 09/09/19 0916 09/10/19 0232  CRP 10.8*   < > 7.1*   < > 3.0* 1.4* 0.8 <0.5  --  0.6  DDIMER 1.25*   < >  1.04*   < > 0.90* 0.83* 1.39*  --  1.20* 1.18*  FERRITIN 276  --  330*  --   --   --   --   --   --   --   BNP  --   --   --   --  46.8 29.7 37.9  --  38.8 62.5   < > = values in this interval not displayed.    Hepatic Function Latest Ref Rng & Units 09/10/2019 09/09/2019 09/08/2019  Total Protein 6.5 - 8.1 g/dL 6.4(L) 6.7 6.4(L)  Albumin 3.5 - 5.0 g/dL 3.1(L) 3.3(L) 3.0(L)  AST 15 - 41 U/L 25 35 24  ALT 0 - 44 U/L 35 37 46(H)  Alk Phosphatase 38 - 126 U/L 49 57 52  Total Bilirubin 0.3 - 1.2 mg/dL 1.3(H) 2.3(H) 1.5(H)     2.  Incidental finding of right lobe of liver cystic lesion.  Outpatient age-appropriate work-up.  3.  Extreme anxiety and depression.  Seen by psychiatry this admission, not suicidal homicidal but her anxiety and panic attacks are quite severe, she has been continued on home dose Klonopin and Zoloft dose has been doubled, she will benefit from outpatient psych follow-up and management.  4.  Essential hypertension.    Continue home regimen upon discharge.  Discharge diagnosis     Active Problems:   Pneumonia due to COVID-19 virus   GERD (gastroesophageal reflux disease)    Discharge instructions     Discharge Instructions    Diet - low sodium heart healthy   Complete by: As directed    Discharge instructions   Complete by: As directed    Follow with Primary MD Scot Jun, FNP in 7 days   Get CBC, CMP, 2 view Chest X ray -  checked next visit within 1 week by Primary MD    Activity: As tolerated with Full fall precautions use walker/cane & assistance as needed  Disposition Home    Diet: Heart Healthy    Special Instructions: If you have smoked or chewed Tobacco  in the last 2 yrs please stop smoking, stop any regular Alcohol  and or any Recreational drug use.  On your next visit with your primary care physician please Get Medicines reviewed and adjusted.  Please request your Prim.MD to go over all Hospital Tests and Procedure/Radiological results at the follow up, please get all Hospital records sent to your Prim MD by signing hospital release before you go home.  If you experience worsening of your admission symptoms, develop shortness of breath, life threatening emergency, suicidal or homicidal thoughts you must seek medical attention immediately by calling 911 or calling your MD immediately  if symptoms less severe.  You Must read complete instructions/literature along with all the possible adverse reactions/side effects for all the Medicines you take and that have been prescribed to you. Take any new Medicines after you have completely understood and accpet all the possible adverse reactions/side effects.   Do not drive, operate heavy machinery, perform activities at heights, swimming or participation in water activities or provide baby sitting services if your were admitted for syncope or siezures until you have seen by Primary MD or a Neurologist and advised to do so again.   Increase activity slowly   Complete by: As directed    MyChart COVID-19 home monitoring program   Complete by: Sep 10, 2019    Is the patient willing to use the [place] for home  monitoring?:  Yes   Temperature monitoring   Complete by: Sep 10, 2019    After how many days would you like to receive a notification of this patient's flowsheet entries?: 1      Discharge Medications   Allergies as of 09/10/2019   No Known Allergies     Medication List    STOP taking these medications   azithromycin 250 MG tablet Commonly known as: ZITHROMAX     TAKE these medications   albuterol 108 (90 Base) MCG/ACT inhaler Commonly known as: VENTOLIN HFA Inhale 1-2 puffs into the lungs every 6 (six) hours as needed for wheezing or shortness of breath.   benzonatate 200 MG capsule Commonly known as: TESSALON Take 200 mg by mouth 3 (three) times daily.   clonazePAM 1 MG tablet Commonly known as: KLONOPIN Take 1 mg by mouth at bedtime.   loratadine 10 MG tablet Commonly known as: CLARITIN Take 10 mg by mouth daily.   omeprazole 40 MG capsule Commonly known as: PRILOSEC Take 1 capsule (40 mg total) by mouth daily.   sertraline 100 MG tablet Commonly known as: ZOLOFT Take 1 tablet (100 mg total) by mouth daily. Start taking on: September 11, 2019 What changed:   medication strength  how much to take            Durable Medical Equipment  (From admission, onward)         Start     Ordered   09/09/19 1033  For home use only DME oxygen  Once    Question Answer Comment  Length of Need 6 Months   Mode or (Route) Mask   Liters per Minute 2   Oxygen delivery system Gas      09/09/19 1032          Follow-up Information    [facility] Follow up.   Why: Physical theapy        [facility] Follow up.   Why: home oxygen       Follow up Primary Care appt at [facility] Follow up.   Contact information: For the appointment listed for the above location - the appointment is a televist appointment please do not go to the office        Kenton Kingfisher, Carroll Sage, FNP. Schedule an appointment as soon as possible for a visit in 1 week(s).     Specialty: Family Medicine Contact information: [place] [address] [phone]           Major procedures and Radiology Reports - PLEASE review detailed and final reports thoroughly  -       CT Angio Chest PE W and/or Wo Contrast  Result Date: 09/02/2019 CLINICAL DATA:  Shortness of breath. EXAM: CT ANGIOGRAPHY CHEST WITH CONTRAST TECHNIQUE: Multidetector CT imaging of the chest was performed using the standard protocol during bolus administration of intravenous contrast. Multiplanar CT image reconstructions and MIPs were obtained to evaluate the vascular anatomy. CONTRAST:  174m OMNIPAQUE IOHEXOL 350 MG/ML SOLN COMPARISON:  None. FINDINGS: Cardiovascular: Satisfactory opacification of the pulmonary arteries to the segmental level. No evidence of pulmonary embolism. Normal heart size. No pericardial effusion. Mediastinum/Nodes: No enlarged mediastinal, hilar, or axillary lymph nodes. Thyroid gland, trachea, and esophagus demonstrate no significant findings. Lungs/Pleura: Moderate severity diffuse areas of atelectasis and/or infiltrate are seen bilaterally, slightly more prominent within the bilateral lung bases. There is no evidence of a pleural effusion or pneumothorax. Upper Abdomen: A 3.7 cm x 2.1 cm  cystic appearing areas seen within the posteromedial aspect of the right lobe of the liver. Musculoskeletal: Multilevel degenerative changes seen throughout the thoracic spine. Review of the MIP images confirms the above findings. IMPRESSION: 1. No evidence of acute pulmonary embolism. 2. Moderate severity diffuse areas of atelectasis and/or infiltrate bilaterally, slightly more prominent within the bilateral lung bases. 3. 3.7 cm cystic appearing lesion in the right lobe of the liver. Electronically Signed   By: Virgina Norfolk M.D.   On: 09/02/2019 21:28   DG Chest Port 1 View  Result Date: 09/08/2019 CLINICAL DATA:  COVID-19 positive.  Shortness of breath. EXAM: PORTABLE  CHEST 1 VIEW COMPARISON:  September 06, 2019 FINDINGS: Bilateral pulmonary infiltrates are stable on the left and in the right base in the interval. The heart, hila, mediastinum, lungs, and pleura are otherwise unchanged. IMPRESSION: Stable bilateral pulmonary infiltrates. Electronically Signed   By: Dorise Bullion III M.D   On: 09/08/2019 10:23   DG Chest Port 1 View  Result Date: 09/06/2019 CLINICAL DATA:  Shortness of breath EXAM: PORTABLE CHEST 1 VIEW COMPARISON:  09/02/2019 plain film and CT FINDINGS: Midline trachea. Mild cardiomegaly. Given differences in technique, similar left greater than right peripheral and basilar predominant interstitial opacities. Mild right hemidiaphragm elevation. No pleural effusion or pneumothorax. IMPRESSION: No significant change in basilar and peripheral predominant interstitial opacities, most consistent with COVID-19 pneumonia. Electronically Signed   By: Abigail Miyamoto M.D.   On: 09/06/2019 08:47   DG Chest Port 1 View  Result Date: 09/02/2019 CLINICAL DATA:  Short of breath. EXAM: PORTABLE CHEST 1 VIEW COMPARISON:  None FINDINGS: Cardiac enlargement. Decreased lung volumes. Bilateral peripheral and lower lobe predominant opacities are identified. No pleural effusion or edema. IMPRESSION: Bilateral peripheral and lower lobe predominant opacities, which in the acute setting are concerning for multifocal pneumonia. Electronically Signed   By: Kerby Moors M.D.   On: 09/02/2019 19:51    Micro Results     Recent Results (from the past 240 hour(s))  Culture, blood (x 2)     Status: None   Collection Time: 09/02/19  9:57 PM   Specimen: BLOOD  Result Value Ref Range Status   Specimen Description   Final    BLOOD LEFT ANTECUBITAL Performed at [facility] [address], [place], [place] [address]    Special Requests   Final    BOTTLES DRAWN AEROBIC AND ANAEROBIC Blood Culture adequate volume Performed at [facility] [address], [place], [place] [address]    Culture   Final    NO GROWTH 5 DAYS Performed at [facility], [place] [address] [place], [place] [address]    Report Status 09/08/2019 FINAL  Final  Culture, blood (x 2)     Status: None   Collection Time: 09/02/19  9:57 PM   Specimen: BLOOD RIGHT HAND  Result Value Ref Range Status   Specimen Description BLOOD RIGHT HAND  Final   Special Requests AEROBIC BOTTLE ONLY Blood Culture adequate volume  Final   Culture   Final    NO GROWTH 5 DAYS Performed at [facility], [place] [address] [place], [place] [address]    Report Status 09/08/2019 FINAL  Final    Today   Subjective    Kristen Bender today has no headache,no chest abdominal pain,no new weakness tingling or numbness, feels much better wants to go home today.     Objective   Blood pressure 103/75,  pulse 90, temperature 98.3 F (36.8 C), temperature source Oral, resp. rate 20, height _0  (1.626 m), weight 99.1 kg, SpO2 90 %.   Intake/Output Summary (Last 24 hours) at 09/10/2019 1011 Last data filed at 09/09/2019 1300 Gross per 24 hour  Intake 240 ml  Output 1000 ml  Net -760 ml    Exam  Awake Alert,   No new F.N deficits, anxious affect [place].AT,PERRAL Supple Neck,No JVD, No cervical lymphadenopathy appriciated.  Symmetrical Chest wall movement, Good air movement bilaterally, CTAB RRR,No Gallops,Rubs or new Murmurs, No Parasternal Heave +ve B.Sounds, Abd Soft, Non tender, No organomegaly appriciated, No rebound -guarding or rigidity. No Cyanosis, Clubbing or edema, No new Rash or bruise   Data Review   CBC w Diff:  Lab Results  Component Value Date   WBC 11.9 (H) 09/10/2019   HGB 13.2 09/10/2019   HCT 40.6 09/10/2019   PLT 312 09/10/2019   LYMPHOPCT 16 09/10/2019   MONOPCT 5 09/10/2019   EOSPCT 3 09/10/2019   BASOPCT 0 09/10/2019    CMP:  Lab Results  Component Value Date   NA 135 09/10/2019   K 3.7 09/10/2019   CL 99 09/10/2019   CO2 25  09/10/2019   BUN 25 (H) 09/10/2019   CREATININE 0.80 09/10/2019   PROT 6.4 (L) 09/10/2019   ALBUMIN 3.1 (L) 09/10/2019   BILITOT 1.3 (H) 09/10/2019   ALKPHOS 49 09/10/2019   AST 25 09/10/2019   ALT 35 09/10/2019  .   Total Time in preparing paper work, data evaluation and todays exam - 19 minutes  Lala Lund M.D on 09/10/2019 at 10:11 AM  [facility]   Office  [phone]

## 2019-09-13 ENCOUNTER — Inpatient Hospital Stay (HOSPITAL_COMMUNITY)
Admission: EM | Admit: 2019-09-13 | Discharge: 2019-09-24 | DRG: 177 | Disposition: A | Discharge status: Home / Self Care | Payer: HRSA Program | Attending: Internal Medicine | Admitting: Internal Medicine

## 2019-09-13 ENCOUNTER — Emergency Department (HOSPITAL_COMMUNITY): Payer: HRSA Program

## 2019-09-13 ENCOUNTER — Other Ambulatory Visit: Payer: Self-pay

## 2019-09-13 ENCOUNTER — Encounter (HOSPITAL_COMMUNITY): Payer: Self-pay | Admitting: *Deleted

## 2019-09-13 DIAGNOSIS — I82443 Acute embolism and thrombosis of tibial vein, bilateral: Secondary | ICD-10-CM | POA: Diagnosis present

## 2019-09-13 DIAGNOSIS — U071 COVID-19: Principal | ICD-10-CM | POA: Diagnosis present

## 2019-09-13 DIAGNOSIS — R0902 Hypoxemia: Secondary | ICD-10-CM

## 2019-09-13 DIAGNOSIS — F419 Anxiety disorder, unspecified: Secondary | ICD-10-CM | POA: Diagnosis present

## 2019-09-13 DIAGNOSIS — Z8249 Family history of ischemic heart disease and other diseases of the circulatory system: Secondary | ICD-10-CM | POA: Diagnosis not present

## 2019-09-13 DIAGNOSIS — I82453 Acute embolism and thrombosis of peroneal vein, bilateral: Secondary | ICD-10-CM | POA: Diagnosis present

## 2019-09-13 DIAGNOSIS — Z6837 Body mass index (BMI) 37.0-37.9, adult: Secondary | ICD-10-CM

## 2019-09-13 DIAGNOSIS — Z79899 Other long term (current) drug therapy: Secondary | ICD-10-CM

## 2019-09-13 DIAGNOSIS — J9601 Acute respiratory failure with hypoxia: Secondary | ICD-10-CM

## 2019-09-13 DIAGNOSIS — K589 Irritable bowel syndrome without diarrhea: Secondary | ICD-10-CM | POA: Diagnosis present

## 2019-09-13 DIAGNOSIS — D649 Anemia, unspecified: Secondary | ICD-10-CM | POA: Diagnosis present

## 2019-09-13 DIAGNOSIS — I248 Other forms of acute ischemic heart disease: Secondary | ICD-10-CM | POA: Diagnosis present

## 2019-09-13 DIAGNOSIS — D6869 Other thrombophilia: Secondary | ICD-10-CM | POA: Diagnosis present

## 2019-09-13 DIAGNOSIS — R7989 Other specified abnormal findings of blood chemistry: Secondary | ICD-10-CM

## 2019-09-13 DIAGNOSIS — J8489 Other specified interstitial pulmonary diseases: Secondary | ICD-10-CM | POA: Diagnosis present

## 2019-09-13 DIAGNOSIS — K219 Gastro-esophageal reflux disease without esophagitis: Secondary | ICD-10-CM | POA: Diagnosis present

## 2019-09-13 DIAGNOSIS — J1282 Pneumonia due to coronavirus disease 2019: Secondary | ICD-10-CM | POA: Diagnosis present

## 2019-09-13 DIAGNOSIS — I82403 Acute embolism and thrombosis of unspecified deep veins of lower extremity, bilateral: Secondary | ICD-10-CM | POA: Diagnosis present

## 2019-09-13 DIAGNOSIS — E079 Disorder of thyroid, unspecified: Secondary | ICD-10-CM | POA: Diagnosis present

## 2019-09-13 DIAGNOSIS — E669 Obesity, unspecified: Secondary | ICD-10-CM | POA: Diagnosis present

## 2019-09-13 LAB — COMPREHENSIVE METABOLIC PANEL
ALT: 30 U/L (ref 0–44)
AST: 21 U/L (ref 15–41)
Albumin: 3.7 g/dL (ref 3.5–5.0)
Alkaline Phosphatase: 60 U/L (ref 38–126)
Anion gap: 10 (ref 5–15)
BUN: 14 mg/dL (ref 8–23)
CO2: 24 mmol/L (ref 22–32)
Calcium: 9.1 mg/dL (ref 8.9–10.3)
Chloride: 104 mmol/L (ref 98–111)
Creatinine, Ser: 0.69 mg/dL (ref 0.44–1.00)
GFR calc Af Amer: >60 mL/min (ref >60)
GFR calc non Af Amer: >60 mL/min (ref >60)
Glucose, Bld: 144 mg/dL — ABNORMAL HIGH (ref 70–99)
Potassium: 3.9 mmol/L (ref 3.5–5.1)
Sodium: 138 mmol/L (ref 135–145)
Total Bilirubin: 1.5 mg/dL — ABNORMAL HIGH (ref 0.3–1.2)
Total Protein: 6.9 g/dL (ref 6.5–8.1)

## 2019-09-13 LAB — CBC WITH DIFFERENTIAL/PLATELET
Abs Immature Granulocytes: 0.12 10*3/uL — ABNORMAL HIGH (ref 0.00–0.07)
Basophils Absolute: 0 10*3/uL (ref 0.0–0.1)
Basophils Relative: 0 %
Eosinophils Absolute: 0 10*3/uL (ref 0.0–0.5)
Eosinophils Relative: 0 %
HCT: 42 % (ref 36.0–46.0)
Hemoglobin: 13.7 g/dL (ref 12.0–15.0)
Immature Granulocytes: 1 %
Lymphocytes Relative: 3 %
Lymphs Abs: 0.5 10*3/uL — ABNORMAL LOW (ref 0.7–4.0)
MCH: 32.6 pg (ref 26.0–34.0)
MCHC: 32.6 g/dL (ref 30.0–36.0)
MCV: 100 fL (ref 80.0–100.0)
Monocytes Absolute: 0.3 10*3/uL (ref 0.1–1.0)
Monocytes Relative: 2 %
Neutro Abs: 12.6 10*3/uL — ABNORMAL HIGH (ref 1.7–7.7)
Neutrophils Relative %: 94 %
Platelets: 250 10*3/uL (ref 150–400)
RBC: 4.2 MIL/uL (ref 3.87–5.11)
RDW: 13.4 % (ref 11.5–15.5)
WBC: 13.5 10*3/uL — ABNORMAL HIGH (ref 4.0–10.5)
nRBC: 0 % (ref 0.0–0.2)

## 2019-09-13 LAB — LACTIC ACID, PLASMA
Lactic Acid, Venous: 1.9 mmol/L (ref 0.5–1.9)
Lactic Acid, Venous: 1.7 mmol/L (ref 0.5–1.9)

## 2019-09-13 LAB — BLOOD GAS, ARTERIAL
Acid-Base Excess: 2.4 mmol/L — ABNORMAL HIGH (ref 0.0–2.0)
Allens test (pass/fail): POSITIVE — AB
Bicarbonate: 25.8 mmol/L (ref 20.0–28.0)
O2 Content: 15 L/min
O2 Saturation: 87.8 %
Patient temperature: 98.6
pCO2 arterial: 37.6 mmHg (ref 32.0–48.0)
pH, Arterial: 7.451 — ABNORMAL HIGH (ref 7.350–7.450)
pO2, Arterial: 53.7 mmHg — ABNORMAL LOW (ref 83.0–108.0)

## 2019-09-13 LAB — FIBRINOGEN: Fibrinogen: 317 mg/dL (ref 210–475)

## 2019-09-13 LAB — PROCALCITONIN: Procalcitonin: <0.1 ng/mL

## 2019-09-13 LAB — TROPONIN I (HIGH SENSITIVITY)
Troponin I (High Sensitivity): 18 ng/L — ABNORMAL HIGH (ref <18)
Troponin I (High Sensitivity): 17 ng/L (ref <18)

## 2019-09-13 LAB — TRIGLYCERIDES: Triglycerides: 104 mg/dL (ref <150)

## 2019-09-13 LAB — FERRITIN: Ferritin: 153 ng/mL (ref 11–307)

## 2019-09-13 LAB — LACTATE DEHYDROGENASE: LDH: 246 U/L — ABNORMAL HIGH (ref 98–192)

## 2019-09-13 LAB — D-DIMER, QUANTITATIVE: D-Dimer, Quant: 6.71 ug/mL-FEU — ABNORMAL HIGH (ref 0.00–0.50)

## 2019-09-13 LAB — C-REACTIVE PROTEIN: CRP: 0.5 mg/dL (ref <1.0)

## 2019-09-13 MED ORDER — IOHEXOL 350 MG/ML SOLN
100.0000 mL | Freq: Once | INTRAVENOUS | Status: AC | PRN
  Administered 2019-09-13: 100 mL via INTRAVENOUS

## 2019-09-13 MED ORDER — VANCOMYCIN HCL 1250 MG/250ML IV SOLN
1250.0000 mg | INTRAVENOUS | Status: DC
  Filled 2019-09-13: qty 250

## 2019-09-13 MED ORDER — ENOXAPARIN SODIUM 40 MG/0.4ML ~~LOC~~ SOLN
40.0000 mg | SUBCUTANEOUS | Status: DC
  Administered 2019-09-14: 40 mg via SUBCUTANEOUS
  Filled 2019-09-13: qty 0.4

## 2019-09-13 MED ORDER — ACETAMINOPHEN 325 MG PO TABS
650.0000 mg | ORAL_TABLET | Freq: Four times a day (QID) | ORAL | Status: DC | PRN
  Administered 2019-09-16 – 2019-09-23 (×6): 650 mg via ORAL
  Filled 2019-09-13 (×7): qty 2

## 2019-09-13 MED ORDER — VANCOMYCIN HCL 2000 MG/400ML IV SOLN
2000.0000 mg | Freq: Once | INTRAVENOUS | Status: AC
  Administered 2019-09-13: 2000 mg via INTRAVENOUS
  Filled 2019-09-13: qty 400

## 2019-09-13 MED ORDER — LORAZEPAM 2 MG/ML IJ SOLN
0.5000 mg | Freq: Once | INTRAMUSCULAR | Status: AC
  Administered 2019-09-13: 0.5 mg via INTRAVENOUS
  Filled 2019-09-13: qty 1

## 2019-09-13 MED ORDER — SODIUM CHLORIDE 0.9 % IV SOLN
2.0000 g | Freq: Three times a day (TID) | INTRAVENOUS | Status: DC
  Administered 2019-09-13 – 2019-09-14 (×3): 2 g via INTRAVENOUS
  Filled 2019-09-13 (×3): qty 2

## 2019-09-13 MED ORDER — LORATADINE 10 MG PO TABS
10.0000 mg | ORAL_TABLET | Freq: Every day | ORAL | Status: DC
  Administered 2019-09-15 – 2019-09-24 (×10): 10 mg via ORAL
  Filled 2019-09-13 (×11): qty 1

## 2019-09-13 MED ORDER — ALBUTEROL SULFATE HFA 108 (90 BASE) MCG/ACT IN AERS: 1.0000 | INHALATION_SPRAY | Freq: Four times a day (QID) | RESPIRATORY_TRACT | Status: DC | PRN

## 2019-09-13 MED ORDER — SERTRALINE HCL 100 MG PO TABS
100.0000 mg | ORAL_TABLET | Freq: Every day | ORAL | Status: DC
  Administered 2019-09-14 – 2019-09-24 (×11): 100 mg via ORAL
  Filled 2019-09-13 (×11): qty 1

## 2019-09-13 MED ORDER — CLONAZEPAM 0.5 MG PO TABS
1.0000 mg | ORAL_TABLET | Freq: Every day | ORAL | Status: DC
  Filled 2019-09-13: qty 2

## 2019-09-13 MED ORDER — DEXAMETHASONE SODIUM PHOSPHATE 10 MG/ML IJ SOLN: 6.0000 mg | INTRAMUSCULAR | Status: DC

## 2019-09-13 MED ORDER — DEXAMETHASONE SODIUM PHOSPHATE 10 MG/ML IJ SOLN
6.0000 mg | Freq: Once | INTRAMUSCULAR | Status: AC
  Administered 2019-09-13: 6 mg via INTRAVENOUS
  Filled 2019-09-13: qty 1

## 2019-09-13 NOTE — ED Notes (Signed)
Call Lazette Estala, son, [phone], for an update, daughter [name] is only available until 6:30PM [phone].

## 2019-09-13 NOTE — ED Provider Notes (Addendum)
D/C 3 days ago from COVID. Worsening hypoxia. Not requiring O2 at D/C but now 15L nonrebreather. CT r/o PE. Admit for hypoxia Physical Exam  BP 128/82   Pulse 85   Temp 97.9 F (36.6 C) (Oral)   Resp (!) 21   Ht 5\' 4"  (1.626 m)   Wt 99 kg   SpO2 96%   BMI 37.46 kg/m   Physical Exam  ED Course/Procedures     Procedures  MDM   Consult: Reviewed with Dr. . Requests ABG be added and critical care consulted due to possible ARDS. Consult: (20: 25) reviewed with E link Dr. Loney Loh. Reviewed possiblity of incipient ARDS.  He does not feel that patient meets criteria for ICU admission at this time.  Suggest continuation of supplemental oxygen and if maintains respiratory rate in 20s and oxygen saturation mid 90s, does no t need ICU. Advises reconsult if deterioration in respiratory status.     Warrick Parisian, MD 09/13/19 09/15/19    Bernell List, MD 09/13/19 2029

## 2019-09-13 NOTE — ED Notes (Signed)
Pt switched from NRB to hi-flow nasal cannula at 15 L/min.  O2 sats 94% on hi-flow

## 2019-09-13 NOTE — ED Notes (Signed)
Pt lying in bed awake. Full monitor on. RT at bedside for blood draw. Will continue to monitor.

## 2019-09-13 NOTE — ED Notes (Signed)
Charge RN placed 1 [place] IV.  Unable to place 2nd IV at this time, only one set of blood cultures able to be collected. EDP made aware.

## 2019-09-13 NOTE — ED Notes (Signed)
Pt reports being dx with COVID-19 on 3/8.    This RN removed pts NRB to have her on RA, pts O2 sats immediately declined to 80%, good waveform noted.  Pt placed on 6L O2 [place], O2 sats only increased to 83%.  Pt placed back on NRB at 15L/min, O2 sats increased to 93%.  Pt speaking in full sentences, AOx4.  EDP Dykstra made aware.  Pt also reports taking a clonopin 'for my panic attack' prior to EMS transport.

## 2019-09-13 NOTE — ED Triage Notes (Signed)
Pt BIBA from home,   Per EMS- Pt c/o SHOB; wheezing   Pts O2 sats found to be in low 80's on RA. 5 mg albuterol neb tx given prior to EMS arrival by home health staff.   EMS gave 0.3 Epi IM, 125 mg solumedrol PIV.   18 L AC

## 2019-09-13 NOTE — H&P (Signed)
History and Physical    Kristen Bender ZTI:[id number] DOB: 1955/08/03 DOA: 09/13/2019  PCP: Scot Jun, FNP Patient coming from: Home  Chief Complaint: SOB  HPI: Kristen Bender is a 64 y.o. female with medical history significant of anemia, anxiety, GERD, IBS, thyroid disease, recent hospital admission 09/02/2019-09/10/2019 for acute hypoxic respiratory failure secondary to acute COVID-19 viral pneumonitis. She was treated with IV steroids, remdesivir, and Actemra.  Discharged home on 2 L supplemental oxygen.  She is presenting to the ED today with complaints of shortness of breath and wheezing.  Received epinephrine and Solu-Medrol 125 mg by EMS. Patient states she has had worsening shortness of breath since her recent hospital discharge.  She continues to cough. Her oxygen saturation has been in the 80s on her pulse ox despite using 5 L supplemental oxygen at home.  No fevers, chills, chest pain, nausea, vomiting, abdominal pain, diarrhea.  States she tested positive for COVID-19 on 08/26/2019.  ED Course: Afebrile.  Tachycardic and tachypneic on arrival.  Oxygen saturation was in the low 80s on room air and patient was placed on 15 L oxygen via nonrebreather.  Labs showing WBC count 13.5.  Lactic acid x2 normal.  Procalcitonin <0.10. D-dimer significantly elevated at 6.71.  LDH 246.  Ferritin, fibrinogen, and CRP normal.  High-sensitivity troponin 18 >17. Blood culture x2 pending. Chest x-ray showing slight increase in patchy opacities consistent with clinical history of COVID-19 positivity. CT angiogram negative for PE.  Showing marked severity patchy bilateral infiltrates, markedly increased in severity when compared to the prior study. Patient received IV Decadron 6 mg. ED provider discussed the case with PCCM MD who did not feel the need for PCCM team involvement at this time.  Review of Systems:  All systems reviewed and apart from history of presenting illness, are negative.  Past  Medical History:  Diagnosis Date  . Anemia   . Anxiety   . GERD (gastroesophageal reflux disease)   . IBS (irritable bowel syndrome)   . Thyroid disease     Past Surgical History:  Procedure Laterality Date  . CESAREAN SECTION     X 2     reports that she has never smoked. She has never used smokeless tobacco. She reports previous alcohol use. She reports that she does not use drugs.  No Known Allergies  Family History  Problem Relation Age of Onset  . Heart disease Mother   . Hypertension Mother   . Mental illness Mother   . Heart disease Father   . Hypertension Father     Prior to Admission medications   Medication Sig Start Date End Date Taking? Authorizing Provider  albuterol (VENTOLIN HFA) 108 (90 Base) MCG/ACT inhaler Inhale 1-2 puffs into the lungs every 6 (six) hours as needed for wheezing or shortness of breath.   Yes [provider]  azithromycin (ZITHROMAX) 250 MG tablet Take 250-500 mg by mouth as directed. Take 2 tablets and then take 1 tablet daily thereafter until completion. 09/11/19  Yes [provider]  benzonatate (TESSALON) 200 MG capsule Take 200 mg by mouth 3 (three) times daily. 08/26/19  Yes [provider]  clonazePAM (KLONOPIN) 1 MG tablet Take 1 mg by mouth at bedtime.   Yes [provider]  loratadine (CLARITIN) 10 MG tablet Take 10 mg by mouth daily. 08/26/19  Yes [provider]  methylPREDNISolone (MEDROL DOSEPAK) 4 MG TBPK tablet Take 4-24 mg by mouth See admin instructions. Take 6 tablets on Day 1 Take  5 tablets on Day 2 Take 4 tablets on Day 3 Take 3 tablets on Day 4 Take 2 tablets on Day 5 Take 1 tablet on Day 6 09/11/19  Yes [provider]  ondansetron (ZOFRAN-ODT) 4 MG disintegrating tablet Take 4 mg by mouth every 8 (eight) hours as needed for nausea or vomiting.  09/11/19  Yes [provider]  sertraline (ZOLOFT) 100 MG tablet Take 1 tablet (100 mg total) by mouth daily. 09/11/19   Yes Leroy Sea, MD  omeprazole (PRILOSEC) 40 MG capsule Take 1 capsule (40 mg total) by mouth daily. Patient not taking: Reported on 09/02/2019 03/17/16   Bing Neighbors, FNP    Physical Exam: Vitals:   09/13/19 2140 09/13/19 2200 09/13/19 2235 09/14/19 0027  BP: (!) 174/101 (!) 144/80 (!) 142/84   Pulse: 96  94   Resp: (!) 23 (!) 26 19   Temp:      TempSrc:      SpO2: 90%  92% 91%  Weight:      Height:        Physical Exam  Constitutional: She is oriented to person, place, and time. She appears well-developed and well-nourished. No distress.  HENT:  Head: Normocephalic.  Eyes: Right eye exhibits no discharge. Left eye exhibits no discharge.  Cardiovascular: Normal rate, regular rhythm and intact distal pulses.  Pulmonary/Chest: She is in respiratory distress. She has no wheezes.  Slightly tachypneic Oxygen saturation in the low 90s on 15 L oxygen via nonrebreather Coarse breath sounds bilaterally  Abdominal: Soft. Bowel sounds are normal. She exhibits no distension. There is no abdominal tenderness. There is no guarding.  Musculoskeletal:        General: No edema.     Cervical back: Neck supple.  Neurological: She is alert and oriented to person, place, and time.  Skin: Skin is warm and dry. She is not diaphoretic.     Labs on Admission: I have personally reviewed following labs and imaging studies  CBC: Recent Labs  Lab 09/07/19 0356 09/08/19 0405 09/09/19 0916 09/10/19 0232 09/13/19 1533  WBC 9.8 9.2 12.0* 11.9* 13.5*  NEUTROABS 7.3 7.2 10.3* 8.8* 12.6*  HGB 13.7 13.3 15.1* 13.2 13.7  HCT 42.4 40.9 46.2* 40.6 42.0  MCV 98.4 97.6 97.9 97.1 100.0  PLT 399 363 374 312 250   Basic Metabolic Panel: Recent Labs  Lab 09/07/19 0356 09/08/19 0405 09/09/19 0713 09/10/19 0232 09/13/19 1533  NA 137 135 135 135 138  K 4.1 4.0 4.8 3.7 3.9  CL 98 98 97* 99 104  CO2 27 27 24 25 24   GLUCOSE 102* 106* 116* 105* 144*  BUN 18 23 25* 25* 14  CREATININE 0.76  0.67 0.77 0.80 0.69  CALCIUM 9.2 8.9 9.2 9.2 9.1  MG 2.2 2.1 2.1 2.1  --    GFR: Estimated Creatinine Clearance: 81.2 mL/min (by C-G formula based on SCr of 0.69 mg/dL). Liver Function Tests: Recent Labs  Lab 09/07/19 0356 09/08/19 0405 09/09/19 0713 09/10/19 0232 09/13/19 1533  AST 27 24 35 25 21  ALT 55* 46* 37 35 30  ALKPHOS 57 52 57 49 60  BILITOT 1.5* 1.5* 2.3* 1.3* 1.5*  PROT 6.9 6.4* 6.7 6.4* 6.9  ALBUMIN 3.1* 3.0* 3.3* 3.1* 3.7   No results for input(s): LIPASE, AMYLASE in the last 168 hours. No results for input(s): AMMONIA in the last 168 hours. Coagulation Profile: No results for input(s): INR, PROTIME in the last 168 hours. Cardiac Enzymes: No  results for input(s): CKTOTAL, CKMB, CKMBINDEX, TROPONINI in the last 168 hours. BNP (last 3 results) No results for input(s): PROBNP in the last 8760 hours. HbA1C: No results for input(s): HGBA1C in the last 72 hours. CBG: No results for input(s): GLUCAP in the last 168 hours. Lipid Profile: Recent Labs    09/13/19 1533  TRIG 104   Thyroid Function Tests: No results for input(s): TSH, T4TOTAL, FREET4, T3FREE, THYROIDAB in the last 72 hours. Anemia Panel: Recent Labs    09/13/19 1533  FERRITIN 153   Urine analysis: No results found for: COLORURINE, APPEARANCEUR, LABSPEC, PHURINE, GLUCOSEU, HGBUR, BILIRUBINUR, KETONESUR, PROTEINUR, UROBILINOGEN, NITRITE, LEUKOCYTESUR  Radiological Exams on Admission: CT Angio Chest PE W and/or Wo Contrast  Result Date: 09/13/2019 CLINICAL DATA:  Shortness of breath. EXAM: CT ANGIOGRAPHY CHEST WITH CONTRAST TECHNIQUE: Multidetector CT imaging of the chest was performed using the standard protocol during bolus administration of intravenous contrast. Multiplanar CT image reconstructions and MIPs were obtained to evaluate the vascular anatomy. CONTRAST:  OMNIPAQUE IOHEXOL 350 MG/ML SOLN COMPARISON:  September 02, 2019 FINDINGS: Cardiovascular: Satisfactory opacification of the  pulmonary arteries to the segmental level. No evidence of pulmonary embolism. Normal heart size. No pericardial effusion. Mediastinum/Nodes: No enlarged mediastinal, hilar, or axillary lymph nodes. Thyroid gland, trachea, and esophagus demonstrate no significant findings. Lungs/Pleura: Marked severity patchy infiltrates are seen throughout both lungs. This is markedly increased in severity when compared to the prior study. There is no evidence of a pleural effusion or pneumothorax. Upper Abdomen: A stable 3.6 cm x 2.2 cm cystic appearing area is seen within the posteromedial aspect of the right lobe of the liver. Musculoskeletal: No chest wall abnormality. No acute or significant osseous findings. Review of the MIP images confirms the above findings. IMPRESSION: 1. No evidence of pulmonary embolism. 2. Marked severity patchy bilateral infiltrates, markedly increased in severity when compared to the prior study. Electronically Signed   By: Aram Candela M.D.   On: 09/13/2019 18:13   DG Chest Port 1 View  Result Date: 09/13/2019 CLINICAL DATA:  Shortness of breath, COVID-19 positivity EXAM: PORTABLE CHEST 1 VIEW COMPARISON:  08/19/2019 FINDINGS: Cardiac shadow is stable. Patchy opacities are again seen throughout both lungs slightly increased from the prior exam consistent with the given clinical history of COVID-19 positivity. No sizable effusion is noted. No bony abnormality is seen. IMPRESSION: Slight increase in patchy opacities consistent with the given clinical history of COVID-19 positivity. Electronically Signed   By: Alcide Clever M.D.   On: 09/13/2019 16:18    EKG: Independently reviewed.  Sinus rhythm.  T wave inversions and slight ST depressions in anterolateral leads are new compared to prior tracing from 09/02/2019.  Assessment/Plan Principal Problem:   Pneumonia due to COVID-19 virus Active Problems:   Elevated d-dimer   Demand ischemia (HCC)   Anxiety   Acute respiratory failure with  hypoxia (HCC)   Worsening hypoxic respiratory failure in the setting of recent COVID-19 viral pneumonia, suspecting possible bacterial superinfection: Afebrile.  Tachycardic and tachypneic on arrival. Oxygen saturation was in the low 80s on room air and patient was placed on 15 L oxygen via nonrebreather.  ABG done on nonrebreather showing pH 7.45, PCO2 37, and PO2 53.  Labs showing mild leukocytosis. Lactic acid x2 normal.  Procalcitonin <0.10. CT angiogram negative for PE.  Showing marked severity patchy bilateral infiltrates, markedly increased in severity when compared to the prior study.  No significant elevation of inflammatory markers on labs. Plan: Treat  with antibiotics including vancomycin and cefepime at this time.  Repeat procalcitonin level in 48 to 72 hours.  Continue airborne and contact precautions.  Already treated with remdesivir and Actemra during recent hospitalization.  Will continue IV Decadron 6 mg daily.  Check influenza panel and RVP.  Order sputum cultures.  Check strep pneumo urinary antigen and Legionella urinary antigen.  Blood culture x2 pending.  Continue oxygen supplementation with heated high flow nasal cannula.  Repeat ABG in a few hours.  Elevated D-dimer: CT angiogram negative for PE.  Order bilateral lower extremity Dopplers to rule out DVT.  Demand ischemia: EKG showing T wave inversions and slight ST depressions in anterolateral leads are new compared to prior tracing. High-sensitivity troponin borderline elevated but flat (18 >17).  Suspect mild troponin elevation and EKG changes are related to demand ischemia in the setting of severe pneumonia.  Patient has no complaints of chest pain and appears comfortable.  Plan is to continue cardiac monitoring and obtain echocardiogram in a.m.  Anxiety: Continue home sertraline and Klonopin  History of thyroid disease: Currently not on any medications.  Check TSH level.  DVT prophylaxis: Lovenox Code Status: Full  code Family Communication: Son updated on the phone. Disposition Plan: Anticipate discharge after clinical improvement. Consults called: PCCM (Dr. Warrick Parisian) Admission status: It is my clinical opinion that admission to INPATIENT is reasonable and necessary because of the expectation that this patient will require hospital care that crosses at least 2 midnights to treat this condition based on the medical complexity of the problems presented.  Given the aforementioned information, the predictability of an adverse outcome is felt to be significant.  The medical decision making on this patient was of high complexity and the patient is at high risk for clinical deterioration, therefore this is a level 3 visit.  John Giovanni MD [facility]  If 7PM-7AM, please contact night-coverage [email or web address]  09/14/2019, 12:40 AM

## 2019-09-13 NOTE — Progress Notes (Signed)
Pharmacy Antibiotic Note  Kristen Bender is a 64 y.o. female admitted on 09/13/2019 with pneumonia.  Pharmacy has been consulted for vanc/cefepime dosing.  Plan:  Vanc 2g x 1 then 1250mg  IV q24 - goal AUC 400-550  Cefepime 2g IV q8 per current renal function  Follow up MRSA PCR  Height: 5\' 4"  (162.6 cm) Weight: 218 lb 4.1 oz (99 kg) IBW/kg (Calculated) : 54.7  Temp (24hrs), Avg:97.9 F (36.6 C), Min:97.9 F (36.6 C), Max:97.9 F (36.6 C)  Recent Labs  Lab 09/07/19 0356 09/08/19 0405 09/09/19 0713 09/09/19 0916 09/10/19 0232 09/13/19 1532 09/13/19 1533 09/13/19 1800  WBC 9.8 9.2  --  12.0* 11.9*  --  13.5*  --   CREATININE 0.76 0.67 0.77  --  0.80  --  0.69  --   LATICACIDVEN  --   --   --   --   --  1.9  --  1.7    Estimated Creatinine Clearance: 81.2 mL/min (by C-G formula based on SCr of 0.69 mg/dL).    No Known Allergies   Thank you for allowing pharmacy to be a part of this patient's care.  09/15/19 09/13/2019 10:20 PM

## 2019-09-13 NOTE — ED Notes (Signed)
Patient transported to CT 

## 2019-09-13 NOTE — ED Provider Notes (Signed)
[facility]-EMERGENCY DEPT Provider Note   CSN: [id number] Arrival date & time: 09/13/19  1400     History Chief Complaint  Patient presents with  . COVID POSITIVE  . Shortness of Breath    Kristen Bender is a 64 y.o. female.  Presents to ER with worsening shortness of breath.  Patient diagnosed with COVID-19 3/8.  Admitted to hospital at that time.  Discharged on 3/23.  Reports initially doing well when she was discharged, however her breathing seems to be worsening over the past day or so.  Has had some mild chest discomfort associated with coughing, right-sided.  Not currently having any pain.  No other new symptoms.  Not having any fever.  Denies prior history of DVT/PE.  Denies prior history of heart disease.  Review chart admission on 3/15 for COVID-19.  Discharge 3/23.  HPI     Past Medical History:  Diagnosis Date  . Anemia   . Anxiety   . GERD (gastroesophageal reflux disease)   . IBS (irritable bowel syndrome)   . Thyroid disease     Patient Active Problem List   Diagnosis Date Noted  . GERD (gastroesophageal reflux disease) 09/03/2019  . Pneumonia due to COVID-19 virus 09/02/2019  . Acid reflux 03/18/2016    Past Surgical History:  Procedure Laterality Date  . CESAREAN SECTION     X 2     OB History   No obstetric history on file.     Family History  Problem Relation Age of Onset  . Heart disease Mother   . Hypertension Mother   . Mental illness Mother   . Heart disease Father   . Hypertension Father     Social History   Tobacco Use  . Smoking status: Never Smoker  . Smokeless tobacco: Never Used  Substance Use Topics  . Alcohol use: Not Currently  . Drug use: Never    Home Medications Prior to Admission medications   Medication Sig Start Date End Date Taking? Authorizing Provider  albuterol (VENTOLIN HFA) 108 (90 Base) MCG/ACT inhaler Inhale 1-2 puffs into the lungs every 6 (six) hours as needed for wheezing or  shortness of breath.    [provider]  azithromycin (ZITHROMAX) 250 MG tablet TAKE TWO TABLETS BY MOUTH AS ONE DOSE ON THE FIRST DAY, THEN TAKE ONE DAILY THEREAFTER. 09/11/19   [provider]  benzonatate (TESSALON) 200 MG capsule Take 200 mg by mouth 3 (three) times daily. 08/26/19   [provider]  clonazePAM (KLONOPIN) 1 MG tablet Take 1 mg by mouth at bedtime.    [provider]  loratadine (CLARITIN) 10 MG tablet Take 10 mg by mouth daily. 08/26/19   [provider]  methylPREDNISolone (MEDROL DOSEPAK) 4 MG TBPK tablet See admin instructions. see package 09/11/19   [provider]  omeprazole (PRILOSEC) 40 MG capsule Take 1 capsule (40 mg total) by mouth daily. Patient not taking: Reported on 09/02/2019 03/17/16   Bing Neighbors, FNP  ondansetron (ZOFRAN-ODT) 4 MG disintegrating tablet DISSOLVE ONE TABLET UNDER THE TONGUE EVERY 8 HOURS AS NEEDED FOR NAUSEA AND VOMITING 09/11/19   [provider]  sertraline (ZOLOFT) 100 MG tablet Take 1 tablet (100 mg total) by mouth daily. 09/11/19   Leroy Sea, MD    Allergies    Patient has no known allergies.  Review of Systems   Review of Systems  Constitutional: Positive for chills and fatigue. Negative for fever.  HENT: Negative for  ear pain and sore throat.   Eyes: Negative for pain and visual disturbance.  Respiratory: Positive for cough and shortness of breath.   Cardiovascular: Positive for chest pain. Negative for palpitations.  Gastrointestinal: Negative for abdominal pain and vomiting.  Genitourinary: Negative for dysuria and hematuria.  Musculoskeletal: Negative for arthralgias and back pain.  Skin: Negative for color change and rash.  Neurological: Negative for seizures and syncope.  All other systems reviewed and are negative.   Physical Exam Updated Vital Signs BP 124/77   Pulse 85   Temp 97.9 F (36.6 C) (Oral)   Resp (!) 24   Ht 5\' 4"  (1.626 m)   Wt 99  kg   SpO2 96%   BMI 37.46 kg/m   Physical Exam Vitals and nursing note reviewed.  Constitutional:      General: She is not in acute distress.    Appearance: She is well-developed.     Comments: Appears somewhat tachypneic, resting comfortably on NRB  HENT:     Head: Normocephalic and atraumatic.  Eyes:     Conjunctiva/sclera: Conjunctivae normal.  Cardiovascular:     Rate and Rhythm: Normal rate and regular rhythm.     Heart sounds: No murmur.  Pulmonary:     Effort: No respiratory distress.     Comments: Bilateral coarse breath sounds, mild tachypnea but no increased work of breathing, no accessory muscle use, speaking in full sentences, on NRB Abdominal:     Palpations: Abdomen is soft.     Tenderness: There is no abdominal tenderness.  Musculoskeletal:     Cervical back: Neck supple.     Right lower leg: No tenderness. No edema.     Left lower leg: No tenderness. No edema.  Skin:    General: Skin is warm and dry.     Capillary Refill: Capillary refill takes less than 2 seconds.  Neurological:     General: No focal deficit present.     Mental Status: She is alert and oriented to person, place, and time.  Psychiatric:        Mood and Affect: Mood normal.        Behavior: Behavior normal.     ED Results / Procedures / Treatments   Labs (all labs ordered are listed, but only abnormal results are displayed) Labs Reviewed  CBC WITH DIFFERENTIAL/PLATELET - Abnormal; Notable for the following components:      Result Value   WBC 13.5 (*)    Neutro Abs 12.6 (*)    Lymphs Abs 0.5 (*)    Abs Immature Granulocytes 0.12 (*)    All other components within normal limits  COMPREHENSIVE METABOLIC PANEL - Abnormal; Notable for the following components:   Glucose, Bld 144 (*)    Total Bilirubin 1.5 (*)    All other components within normal limits  D-DIMER, QUANTITATIVE (NOT AT [facility]) - Abnormal; Notable for the following components:   D-Dimer, Quant 6.71 (*)    All other  components within normal limits  LACTATE DEHYDROGENASE - Abnormal; Notable for the following components:   LDH 246 (*)    All other components within normal limits  TROPONIN I (HIGH SENSITIVITY) - Abnormal; Notable for the following components:   Troponin I (High Sensitivity) 18 (*)    All other components within normal limits  CULTURE, BLOOD (ROUTINE X 2)  CULTURE, BLOOD (ROUTINE X 2)  LACTIC ACID, PLASMA  PROCALCITONIN  FERRITIN  TRIGLYCERIDES  FIBRINOGEN  C-REACTIVE PROTEIN  LACTIC ACID, PLASMA  TROPONIN I (HIGH SENSITIVITY)    EKG EKG Interpretation  Date/Time:  Friday September 13 2019 14:53:22 EDT Ventricular Rate:  89 PR Interval:    QRS Duration: 93 QT Interval:  347 QTC Calculation: 423 R Axis:   42 Text Interpretation: Sinus rhythm Abnormal R-wave progression, early transition Borderline repolarization abnormality no acute change Confirmed by Marianna Fuss ([id number]) on 09/13/2019 3:10:43 PM   Radiology DG Chest Port 1 View  Result Date: 09/13/2019 CLINICAL DATA:  Shortness of breath, COVID-19 positivity EXAM: PORTABLE CHEST 1 VIEW COMPARISON:  08/19/2019 FINDINGS: Cardiac shadow is stable. Patchy opacities are again seen throughout both lungs slightly increased from the prior exam consistent with the given clinical history of COVID-19 positivity. No sizable effusion is noted. No bony abnormality is seen. IMPRESSION: Slight increase in patchy opacities consistent with the given clinical history of COVID-19 positivity. Electronically Signed   By: Alcide Clever M.D.   On: 09/13/2019 16:18    Procedures .Critical Care Performed by: Milagros Loll, MD Authorized by: Milagros Loll, MD   Critical care provider statement:    Critical care time (minutes):  45   Critical care was necessary to treat or prevent imminent or life-threatening deterioration of the following conditions:  Respiratory failure   Critical care was time spent personally by me on the following  activities:  Discussions with consultants, evaluation of patient's response to treatment, examination of patient, ordering and performing treatments and interventions, ordering and review of laboratory studies, ordering and review of radiographic studies, pulse oximetry, re-evaluation of patient's condition, obtaining history from patient or surrogate and review of old charts   (including critical care time)  Medications Ordered in ED Medications  dexamethasone (DECADRON) injection 6 mg (6 mg Intravenous Given 09/13/19 1701)  iohexol (OMNIPAQUE) 350 MG/ML injection 100 mL (100 mLs Intravenous Contrast Given 09/13/19 1719)    ED Course  I have reviewed the triage vital signs and the nursing notes.  Pertinent labs & imaging results that were available during my care of the patient were reviewed by me and considered in my medical decision making (see chart for details).    MDM Rules/Calculators/A&P                      64 year old lady presenting to ER with worsening shortness of breath in setting of known COVID-19.  Required significant oxygen but does not appear to be in any distress.  Tolerating NRB well.  D-dimer much more elevated today than a couple days ago.  Raising concern for possible pulmonary embolism.  CTPA study ordered to further eval.  While awaiting CT scan, patient signed out to Dr. Clarice Pole.  Refer to her note for final plan and disposition.  Anticipate admission to hospitalist service regardless.  Final Clinical Impression(s) / ED Diagnoses Final diagnoses:  Acute respiratory failure with hypoxia (HCC)  Pneumonia due to COVID-19 virus  Positive D dimer    Rx / DC Orders ED Discharge Orders    None       Milagros Loll, MD 09/13/19 1737

## 2019-09-13 NOTE — ED Notes (Signed)
Pt lying in bed awake talking on the phone. Denies any needs. Monitor on. Will continue to watch pt.

## 2019-09-14 ENCOUNTER — Inpatient Hospital Stay (HOSPITAL_COMMUNITY): Payer: HRSA Program

## 2019-09-14 ENCOUNTER — Inpatient Hospital Stay: Payer: Self-pay

## 2019-09-14 DIAGNOSIS — I82403 Acute embolism and thrombosis of unspecified deep veins of lower extremity, bilateral: Secondary | ICD-10-CM | POA: Diagnosis present

## 2019-09-14 DIAGNOSIS — U071 COVID-19: Secondary | ICD-10-CM

## 2019-09-14 DIAGNOSIS — F419 Anxiety disorder, unspecified: Secondary | ICD-10-CM

## 2019-09-14 DIAGNOSIS — R7989 Other specified abnormal findings of blood chemistry: Secondary | ICD-10-CM

## 2019-09-14 DIAGNOSIS — R9431 Abnormal electrocardiogram [ECG] [EKG]: Secondary | ICD-10-CM

## 2019-09-14 DIAGNOSIS — J1282 Pneumonia due to coronavirus disease 2019: Secondary | ICD-10-CM

## 2019-09-14 DIAGNOSIS — J9601 Acute respiratory failure with hypoxia: Secondary | ICD-10-CM

## 2019-09-14 DIAGNOSIS — I248 Other forms of acute ischemic heart disease: Secondary | ICD-10-CM

## 2019-09-14 LAB — CBC WITH DIFFERENTIAL/PLATELET
Abs Immature Granulocytes: 0.27 10*3/uL — ABNORMAL HIGH (ref 0.00–0.07)
Basophils Absolute: 0 10*3/uL (ref 0.0–0.1)
Basophils Relative: 0 %
Eosinophils Absolute: 0 10*3/uL (ref 0.0–0.5)
Eosinophils Relative: 0 %
HCT: 42.5 % (ref 36.0–46.0)
Hemoglobin: 13.4 g/dL (ref 12.0–15.0)
Immature Granulocytes: 2 %
Lymphocytes Relative: 6 %
Lymphs Abs: 0.8 10*3/uL (ref 0.7–4.0)
MCH: 31.6 pg (ref 26.0–34.0)
MCHC: 31.5 g/dL (ref 30.0–36.0)
MCV: 100.2 fL — ABNORMAL HIGH (ref 80.0–100.0)
Monocytes Absolute: 0.3 10*3/uL (ref 0.1–1.0)
Monocytes Relative: 2 %
Neutro Abs: 12.1 10*3/uL — ABNORMAL HIGH (ref 1.7–7.7)
Neutrophils Relative %: 90 %
Platelets: 244 10*3/uL (ref 150–400)
RBC: 4.24 MIL/uL (ref 3.87–5.11)
RDW: 13.5 % (ref 11.5–15.5)
WBC: 13.5 10*3/uL — ABNORMAL HIGH (ref 4.0–10.5)
nRBC: 0 % (ref 0.0–0.2)

## 2019-09-14 LAB — RESPIRATORY PANEL BY PCR

## 2019-09-14 LAB — RESPIRATORY PANEL BY RT PCR (FLU A&B, COVID)
Influenza A by PCR: NEGATIVE
Influenza B by PCR: NEGATIVE
SARS Coronavirus 2 by RT PCR: POSITIVE — AB

## 2019-09-14 LAB — PROTIME-INR
INR: 1.1 (ref 0.8–1.2)
Prothrombin Time: 14.2 seconds (ref 11.4–15.2)

## 2019-09-14 LAB — ECHOCARDIOGRAM LIMITED
Height: 64 in
Weight: 3492.09 oz

## 2019-09-14 LAB — BLOOD GAS, ARTERIAL
Acid-Base Excess: 1.8 mmol/L (ref 0.0–2.0)
Bicarbonate: 25.4 mmol/L (ref 20.0–28.0)
O2 Saturation: 90.2 %
Patient temperature: 98
pCO2 arterial: 37.6 mmHg (ref 32.0–48.0)
pH, Arterial: 7.442 (ref 7.350–7.450)
pO2, Arterial: 57.6 mmHg — ABNORMAL LOW (ref 83.0–108.0)

## 2019-09-14 LAB — APTT: aPTT: 47 seconds — ABNORMAL HIGH (ref 24–36)

## 2019-09-14 LAB — CREATININE, SERUM
Creatinine, Ser: 0.65 mg/dL (ref 0.44–1.00)
GFR calc Af Amer: >60 mL/min (ref >60)
GFR calc non Af Amer: >60 mL/min (ref >60)

## 2019-09-14 LAB — TROPONIN I (HIGH SENSITIVITY): Troponin I (High Sensitivity): 21 ng/L — ABNORMAL HIGH (ref <18)

## 2019-09-14 LAB — ABO/RH: ABO/RH(D): A POS

## 2019-09-14 LAB — GLUCOSE, CAPILLARY: Glucose-Capillary: 128 mg/dL — ABNORMAL HIGH (ref 70–99)

## 2019-09-14 LAB — STREP PNEUMONIAE URINARY ANTIGEN: Strep Pneumo Urinary Antigen: NEGATIVE

## 2019-09-14 LAB — HEPARIN LEVEL (UNFRACTIONATED): Heparin Unfractionated: 1.46 IU/mL — ABNORMAL HIGH (ref 0.30–0.70)

## 2019-09-14 LAB — LACTIC ACID, PLASMA: Lactic Acid, Venous: 1.7 mmol/L (ref 0.5–1.9)

## 2019-09-14 LAB — CK TOTAL AND CKMB (NOT AT ARMC)
CK, MB: 3.9 ng/mL (ref 0.5–5.0)
Relative Index: 3 — ABNORMAL HIGH (ref 0.0–2.5)
Total CK: 129 U/L (ref 38–234)

## 2019-09-14 LAB — SEDIMENTATION RATE: Sed Rate: 3 mm/hr (ref 0–22)

## 2019-09-14 LAB — TSH: TSH: 0.43 u[IU]/mL (ref 0.350–4.500)

## 2019-09-14 LAB — MRSA PCR SCREENING: MRSA by PCR: NEGATIVE

## 2019-09-14 MED ORDER — MOMETASONE FURO-FORMOTEROL FUM 200-5 MCG/ACT IN AERO
2.0000 | INHALATION_SPRAY | Freq: Two times a day (BID) | RESPIRATORY_TRACT | Status: DC
  Administered 2019-09-14 – 2019-09-24 (×21): 2 via RESPIRATORY_TRACT
  Filled 2019-09-14: qty 8.8

## 2019-09-14 MED ORDER — HEPARIN (PORCINE) 25000 UT/250ML-% IV SOLN
1300.0000 [IU]/h | INTRAVENOUS | Status: DC
  Administered 2019-09-14: 1300 [IU]/h via INTRAVENOUS
  Filled 2019-09-14: qty 250

## 2019-09-14 MED ORDER — METHYLPREDNISOLONE SODIUM SUCC 40 MG IJ SOLR
40.0000 mg | Freq: Three times a day (TID) | INTRAMUSCULAR | Status: DC
  Administered 2019-09-14: 40 mg via INTRAVENOUS
  Filled 2019-09-14: qty 1

## 2019-09-14 MED ORDER — SODIUM CHLORIDE 0.9% FLUSH
10.0000 mL | INTRAVENOUS | Status: DC | PRN
  Administered 2019-09-18: 10 mL

## 2019-09-14 MED ORDER — CHLORHEXIDINE GLUCONATE 0.12 % MT SOLN
15.0000 mL | Freq: Two times a day (BID) | OROMUCOSAL | Status: DC
  Administered 2019-09-14 – 2019-09-24 (×21): 15 mL via OROMUCOSAL
  Filled 2019-09-14 (×20): qty 15

## 2019-09-14 MED ORDER — SODIUM CHLORIDE 0.9% FLUSH
10.0000 mL | Freq: Two times a day (BID) | INTRAVENOUS | Status: DC
  Administered 2019-09-14 – 2019-09-24 (×16): 10 mL

## 2019-09-14 MED ORDER — ORAL CARE MOUTH RINSE
15.0000 mL | Freq: Two times a day (BID) | OROMUCOSAL | Status: DC
  Administered 2019-09-14 – 2019-09-24 (×15): 15 mL via OROMUCOSAL

## 2019-09-14 MED ORDER — FUROSEMIDE 10 MG/ML IJ SOLN
20.0000 mg | Freq: Once | INTRAMUSCULAR | Status: AC
  Administered 2019-09-14: 20 mg via INTRAVENOUS
  Filled 2019-09-14: qty 2

## 2019-09-14 MED ORDER — GUAIFENESIN ER 600 MG PO TB12
1200.0000 mg | ORAL_TABLET | Freq: Two times a day (BID) | ORAL | Status: DC
  Administered 2019-09-14 – 2019-09-24 (×21): 1200 mg via ORAL
  Filled 2019-09-14 (×21): qty 2

## 2019-09-14 MED ORDER — ZINC SULFATE 220 (50 ZN) MG PO CAPS
220.0000 mg | ORAL_CAPSULE | Freq: Every day | ORAL | Status: DC
  Administered 2019-09-14 – 2019-09-24 (×11): 220 mg via ORAL
  Filled 2019-09-14 (×11): qty 1

## 2019-09-14 MED ORDER — ORAL CARE MOUTH RINSE: 15.0000 mL | Freq: Two times a day (BID) | OROMUCOSAL | Status: DC

## 2019-09-14 MED ORDER — FUROSEMIDE 10 MG/ML IJ SOLN
40.0000 mg | Freq: Once | INTRAMUSCULAR | Status: AC
  Administered 2019-09-14: 40 mg via INTRAVENOUS
  Filled 2019-09-14: qty 4

## 2019-09-14 MED ORDER — CHLORHEXIDINE GLUCONATE CLOTH 2 % EX PADS
6.0000 | MEDICATED_PAD | Freq: Every day | CUTANEOUS | Status: DC
  Administered 2019-09-14 – 2019-09-18 (×4): 6 via TOPICAL

## 2019-09-14 MED ORDER — HEPARIN (PORCINE) 25000 UT/250ML-% IV SOLN: 800.0000 [IU]/h | INTRAVENOUS | Status: DC

## 2019-09-14 MED ORDER — CLONAZEPAM 1 MG PO TABS
1.0000 mg | ORAL_TABLET | Freq: Two times a day (BID) | ORAL | Status: DC
  Administered 2019-09-14 – 2019-09-24 (×21): 1 mg via ORAL
  Filled 2019-09-14 (×2): qty 1
  Filled 2019-09-14: qty 2
  Filled 2019-09-14 (×4): qty 1
  Filled 2019-09-14: qty 2
  Filled 2019-09-14 (×7): qty 1
  Filled 2019-09-14: qty 2
  Filled 2019-09-14 (×4): qty 1
  Filled 2019-09-14: qty 2
  Filled 2019-09-14: qty 1

## 2019-09-14 MED ORDER — ASCORBIC ACID 500 MG PO TABS
500.0000 mg | ORAL_TABLET | Freq: Every day | ORAL | Status: DC
  Administered 2019-09-14 – 2019-09-24 (×11): 500 mg via ORAL
  Filled 2019-09-14 (×11): qty 1

## 2019-09-14 MED ORDER — DEXAMETHASONE SODIUM PHOSPHATE 10 MG/ML IJ SOLN: 10.0000 mg | INTRAMUSCULAR | Status: DC

## 2019-09-14 MED ORDER — HEPARIN BOLUS VIA INFUSION
2000.0000 [IU] | Freq: Once | INTRAVENOUS | Status: AC
  Administered 2019-09-14: 2000 [IU] via INTRAVENOUS
  Filled 2019-09-14: qty 2000

## 2019-09-14 NOTE — Progress Notes (Signed)
Brief Pharmacy Consult Note - IV Heparin  Labs: heparin level 1.46  A/P: Heparin level SUPRAtherapeutic (goal 0.3-0.7) for DVT on current IV heparin rate of 1300 units/hr. Per RN, heparin level was not drawn out of same line as heparin and has not noted any bleeding. Decrease IV Heparin rate to 800 units/hr and will recheck level 6 hours after rate change  Hessie Knows, PharmD, BCPS 09/14/2019 10:43 PM

## 2019-09-14 NOTE — ED Notes (Signed)
Pt lying in bed, eye's closed, chest rising and falling. Full monitor on. Will continue to monitor.  

## 2019-09-14 NOTE — ED Notes (Signed)
Pt  Lying in bed awake. RT at bedside. NAD noted.

## 2019-09-14 NOTE — Progress Notes (Signed)
0300 ABG was on 60 Liters and 100% on  heated high flow

## 2019-09-14 NOTE — Progress Notes (Signed)
ANTICOAGULATION CONSULT NOTE - Initial Consult  Pharmacy Consult for heparin Indication: DVT  No Known Allergies  Patient Measurements: Height: 5\' 4"  (162.6 cm) Weight: 218 lb 4.1 oz (99 kg) IBW/kg (Calculated) : 54.7 Heparin Dosing Weight: 78 kg  Vital Signs: BP: 121/72 (03/27 1315) Pulse Rate: 84 (03/27 1315)  Labs: Recent Labs    09/13/19 1533 09/13/19 1800 09/14/19 0400  HGB 13.7  --  13.4  HCT 42.0  --  42.5  PLT 250  --  244  CREATININE 0.69  --  0.65  TROPONINIHS 18* 17  --     Estimated Creatinine Clearance: 81.2 mL/min (by C-G formula based on SCr of 0.65 mg/dL).   Medical History: Past Medical History:  Diagnosis Date  . Anemia   . Anxiety   . GERD (gastroesophageal reflux disease)   . IBS (irritable bowel syndrome)   . Thyroid disease     Medications: Pt was not taking anticoagulants PTA -Enoxaparin 40 mg subQ dose given 3/27 @ 0950  Assessment: Pharmacy consulted to dose/monitor heparin in this 64 year old female recently hospitalized with COVID PNA. Pt presented to ED with complaints of SOB, venous dopplar on 3/27 revealed bilateral acute DVT. CTA 3/26: No evidence of PE. Pt not taking anticoagulants PTA.  Today, 09/14/19  CBC: Hgb/Plt both WNL  APTT and protime/INR ordered STAT for baseline labs  SCr 0.65, CrCl ~80 mL/min  Enoxaparin has been discontinued  Goal of Therapy:  Heparin level 0.3-0.7 units/ml Monitor platelets by anticoagulation protocol: Yes   Plan:  Give 2000 units bolus x 1 Start heparin infusion at 1300 units/hr Check anti-Xa level in 6 hours and daily while on heparin Continue to monitor H&H and platelets  09/16/19, PharmD 09/14/2019,1:42 PM

## 2019-09-14 NOTE — Consult Note (Addendum)
NAME:  Kristen Bender, MRN:  [id number], DOB:  Apr 18, 1956, LOS: 1 ADMISSION DATE:  09/13/2019, CONSULTATION DATE:  09/14/2019 REFERRING MD:  Dr Tana Coast, CHIEF COMPLAINT: acute hypoxemic resp failure  Brief History   bounceback with worsening hypoxemia and new bilateral DVT post covid Rx  History of present illness    64 year old [place] lady from [place] originally.  Works at [employer] in [place].  She was admitted between September 02, 2019 and September 10, 2019 with COVID-19 acute hypoxic respiratory failure.She was treated with oxygen via nonrebreather mask, IV steroids remdesivir and Tocilizumab.  At the time of discharge she was discharged on 2 L nasal cannula.  90% on 4 L.  Course was complicated by anxiety and requiring psychiatry consultation.  She got readmitted September 13, 2019 with shortness of breath, wheezing and worsening hypoxemia.  Currently on heated high flow nasal cannula.  Her procalcitonin is less than 0.1.  CRP is essentially normal as seen below.  D-dimer significantly elevated but CT angiogram ruled out pulmonary embolism [CT did show worsening infiltrates compared to her admission CT] patient is being started on IV steroids.  Doppler ultrasounds have resulted in bilateral DVT acute.  IV heparin has been started by hospitalist.  Pulmonary critical care has been called for consultation.  Patient is wondering if she can be discharged home but she is feeling okay.   She also suffers from acid reflux disease, irritable bowel syndrome thyroid disorder. [facility] from prior hospitalization indicated significant anxiety disorder  Results for Kristen Bender, Kristen Bender (MRN [id number]) as of 09/14/2019 15:27  Ref. Range 09/08/2019 04:05 09/09/2019 07:13 09/10/2019 02:32 09/13/2019 15:33  CRP Latest Ref Range: <1.0 mg/dL 0.8 <0.5 0.6 0.5   Results for Kristen Bender, Kristen Bender (MRN [id number]) as of 09/14/2019 15:27  Ref. Range 09/07/2019 03:56 09/08/2019 04:05 09/09/2019 09:16 09/10/2019 02:32 09/13/2019  15:33  D-Dimer, America Brown Latest Ref Range: 0.00 - 0.50 ug/mL-FEU 0.83 (H) 1.39 (H) 1.20 (H) 1.18 (H) 6.71 (H)   Past Medical History     has a past medical history of Anemia, Anxiety, GERD (gastroesophageal reflux disease), IBS (irritable bowel syndrome), and Thyroid disease.   reports that she has never smoked. She has never used smokeless tobacco.  Past Surgical History:  Procedure Laterality Date  . CESAREAN SECTION     X 2    No Known Allergies   There is no immunization history on file for this patient.  Family History  Problem Relation Age of Onset  . Heart disease Mother   . Hypertension Mother   . Mental illness Mother   . Heart disease Father   . Hypertension Father      Current Facility-Administered Medications:  .  acetaminophen (TYLENOL) tablet 650 mg, 650 mg, Oral, Q6H PRN, Shela Leff, MD .  albuterol (VENTOLIN HFA) 108 (90 Base) MCG/ACT inhaler 1-2 puff, 1-2 puff, Inhalation, Q6H PRN, Shela Leff, MD .  ascorbic acid (VITAMIN C) tablet 500 mg, 500 mg, Oral, Daily, Rai, Ripudeep K, MD, 500 mg at 09/14/19 1422 .  ceFEPIme (MAXIPIME) 2 g in sodium chloride 0.9 % 100 mL IVPB, 2 g, Intravenous, Q8H, Adrian Saran, RPH, Stopped at 09/14/19 1353 .  chlorhexidine (PERIDEX) 0.12 % solution 15 mL, 15 mL, Mouth Rinse, BID, Rai, Ripudeep K, MD, 15 mL at 09/14/19 1319 .  Chlorhexidine Gluconate Cloth 2 % PADS 6 each, 6 each, Topical, Daily, Rai, Ripudeep K, MD, 6 each at 09/14/19 1319 .  clonazePAM (KLONOPIN) tablet 1 mg, 1 mg,  Oral, BID, Rai, Ripudeep K, MD, 1 mg at 09/14/19 1421 .  furosemide (LASIX) injection 40 mg, 40 mg, Intravenous, Once, Brand Males, MD .  guaiFENesin (MUCINEX) 12 hr tablet 1,200 mg, 1,200 mg, Oral, BID, Rai, Ripudeep K, MD, 1,200 mg at 09/14/19 1422 .  heparin ADULT infusion 100 units/mL (25000 units/240m sodium chloride 0.45%), 1,300 Units/hr, Intravenous, Continuous, SLenis Noon RPH, Last Rate: 13 mL/hr at 09/14/19 1425,  1,300 Units/hr at 09/14/19 1425 .  loratadine (CLARITIN) tablet 10 mg, 10 mg, Oral, Daily, RShela Leff MD .  MEDLINE mouth rinse, 15 mL, Mouth Rinse, q12n4p, Rai, Ripudeep K, MD, 15 mL at 09/14/19 1319 .  methylPREDNISolone sodium succinate (SOLU-MEDROL) 40 mg/mL injection 40 mg, 40 mg, Intravenous, Q8H, Rai, Ripudeep K, MD .  mometasone-formoterol (DULERA) 200-5 MCG/ACT inhaler 2 puff, 2 puff, Inhalation, BID, Rai, Ripudeep K, MD, 2 puff at 09/14/19 1423 .  sertraline (ZOLOFT) tablet 100 mg, 100 mg, Oral, Daily, RShela Leff MD, 100 mg at 09/14/19 1318 .  sodium chloride flush (NS) 0.9 % injection 10-40 mL, 10-40 mL, Intracatheter, Q12H, RShela Leff MD, 10 mL at 09/14/19 1319 .  sodium chloride flush (NS) 0.9 % injection 10-40 mL, 10-40 mL, Intracatheter, PRN, RShela Leff MD .  vancomycin (VANCOREADY) IVPB 1250 mg/250 mL, 1,250 mg, Intravenous, Q24H, LAdrian Saran [facility]  zinc sulfate capsule 220 mg, 220 mg, Oral, Daily, Rai, Ripudeep K, MD, 220 mg at 09/14/19 1[facility]   09/13/2019 - admit  Consults:  3/27 - cccm consult  Procedures:  x  Significant Diagnostic Tests:  3/27 - duplex LE - DVT +  Micro Data:  RVP 3/26 - neg Urine leg 3/26 - neg Autooimmune and vasculitis 3/27 -  ESR 3/27 -   Antimicrobials:   vanc 3/26 >>3/27 Cefepime 3/26 >>3/27    Interim history/subjective:  3/27   Seen by CCM at [facility]  Objective   Blood pressure (!) 146/77, pulse 81, temperature 97.9 F (36.6 C), temperature source Oral, resp. rate (!) 29, height '5\' 4"'$  (1.626 m), weight 99 kg, SpO2 93 %.    FiO2 (%):  [90 %-100 %] 100 %   Intake/Output Summary (Last 24 hours) at 09/14/2019 1433 Last data filed at 09/14/2019 0155 Gross per 24 hour  Intake 499.6 ml  Output --  Net 499.6 ml   Filed Weights   09/13/19 1415  Weight: 99 kg    Examination: General: pleasant female. Watching TV. On phone HENT: no elevated JVP. No neck  nodes Lungs: bilateral LL crackles. HHFNC +. No visual evidence of resp distress. Puls ox 88% Cardiovascular: RRR Abdomen: soft, non tender Extremities: no chyanosis. No clubbing. No edema Neuro: axox3. Speech normal GU: not examined  Resolved Hospital Problem list   x  Assessment & Plan:  Acute hypoxic respiratory failure with pulmonary infiltrates -bounce back in worsening after successful treatment in the initial phase of COVID-19 pneumonia  Bilateral lower extremity DVT  Background of anxiety  -Given normal CRP and procalcitonin: Doubt this is infectious in etiology -Pulmonary embolism is a very good possibility but CT angiogram is smooth without but she does have DVT -Other respiratory viruses have been ruled out.  Streptococcal pneumonia has been ruled out through urine antigen -Acute diastolic heart failure is a possibility -If the ESR is high [pending] -then despite normal CRP COP/Boop is a possibility -Fibrotic phase of ARDS/acute lung injury is another possibility  Plan -Sit in the chair as much  as possible and do incentive spirometry -Prone positioning on 4:1 ratio -Diurese -Oxygen for pulse ox ideally greater than 88% if not greater than 85% =-IV heparin for DVT - check troponin - check autoimmune and vasculitis profile - check HP panel -Anxiety management through hospitalist - - dC steroids for the moment but need to reconsider if no response to lasix and/or ESR high or she is gettnig worse - dc antibiotics -Intubate if worse  Inform patient of guarded prognosis and her recovery could be few to several weeks before she gets home  Best practice:  According to the hospitalist  Tried to call Celesta Funderburk the daughter - updated her 4:05 PM - she likes ccm to call her 09/15/19    ATTESTATION & SIGNATURE   The patient Kristen Bender is critically ill with multiple organ systems failure and requires high complexity decision making for assessment and  support, frequent evaluation and titration of therapies, application of advanced monitoring technologies and extensive interpretation of multiple databases.   Critical Care Time devoted to patient care services described in this note is  35  Minutes. This time reflects time of care of this signee Dr Brand Males. This critical care time does not reflect procedure time, or teaching time or supervisory time of PA/NP/Med student/Med Resident etc but could involve care discussion time     Dr. Brand Males, M.D., [facility].C.P Pulmonary and Critical Care Medicine Staff Physician [facility] Pager: [phone], If no answer or between  15:00h - 7:00h: call [phone]  09/14/2019 3:38 PM    LABS    PULMONARY Recent Labs  Lab 09/13/19 2116 09/14/19 0230  PHART 7.451* 7.442  PCO2ART 37.6 37.6  PO2ART 53.7* 57.6*  HCO3 25.8 25.4  O2SAT 87.8 90.2    CBC Recent Labs  Lab 09/10/19 0232 09/13/19 1533 09/14/19 0400  HGB 13.2 13.7 13.4  HCT 40.6 42.0 42.5  WBC 11.9* 13.5* 13.5*  PLT 312 250 244    COAGULATION No results for input(s): INR in the last 168 hours.  CARDIAC  No results for input(s): TROPONINI in the last 168 hours. No results for input(s): PROBNP in the last 168 hours.   CHEMISTRY Recent Labs  Lab 09/08/19 0405 09/08/19 0405 09/09/19 0713 09/09/19 0713 09/10/19 0232 09/13/19 1533 09/14/19 0400  NA 135  --  135  --  135 138  --   K 4.0   < > 4.8   < > 3.7 3.9  --   CL 98  --  97*  --  99 104  --   CO2 27  --  24  --  25 24  --   GLUCOSE 106*  --  116*  --  105* 144*  --   BUN 23  --  25*  --  25* 14  --   CREATININE 0.67  --  0.77  --  0.80 0.69 0.65  CALCIUM 8.9  --  9.2  --  9.2 9.1  --   MG 2.1  --  2.1  --  2.1  --   --    < > = values in this interval not displayed.   Estimated Creatinine Clearance: 81.2 mL/min (by C-G formula based on SCr of 0.65 mg/dL).   LIVER Recent Labs  Lab  09/08/19 0405 09/09/19 0713 09/10/19 0232 09/13/19 1533  AST 24 35 25 21  ALT 46* 37 35 30  ALKPHOS 52 57  49 60  BILITOT 1.5* 2.3* 1.3* 1.5*  PROT 6.4* 6.7 6.4* 6.9  ALBUMIN 3.0* 3.3* 3.1* 3.7     INFECTIOUS Recent Labs  Lab 09/13/19 1532 09/13/19 1533 09/13/19 1800  LATICACIDVEN 1.9  --  1.7  PROCALCITON  --  <0.10  --      ENDOCRINE CBG (last 3)  No results for input(s): GLUCAP in the last 72 hours.       IMAGING x48h  - image(s) personally visualized  -   highlighted in bold CT Angio Chest PE W and/or Wo Contrast  Result Date: 09/13/2019 CLINICAL DATA:  Shortness of breath. EXAM: CT ANGIOGRAPHY CHEST WITH CONTRAST TECHNIQUE: Multidetector CT imaging of the chest was performed using the standard protocol during bolus administration of intravenous contrast. Multiplanar CT image reconstructions and MIPs were obtained to evaluate the vascular anatomy. CONTRAST:  190m OMNIPAQUE IOHEXOL 350 MG/ML SOLN COMPARISON:  September 02, 2019 FINDINGS: Cardiovascular: Satisfactory opacification of the pulmonary arteries to the segmental level. No evidence of pulmonary embolism. Normal heart size. No pericardial effusion. Mediastinum/Nodes: No enlarged mediastinal, hilar, or axillary lymph nodes. Thyroid gland, trachea, and esophagus demonstrate no significant findings. Lungs/Pleura: Marked severity patchy infiltrates are seen throughout both lungs. This is markedly increased in severity when compared to the prior study. There is no evidence of a pleural effusion or pneumothorax. Upper Abdomen: A stable 3.6 cm x 2.2 cm cystic appearing area is seen within the posteromedial aspect of the right lobe of the liver. Musculoskeletal: No chest wall abnormality. No acute or significant osseous findings. Review of the MIP images confirms the above findings. IMPRESSION: 1. No evidence of pulmonary embolism. 2. Marked severity patchy bilateral infiltrates, markedly increased in severity when compared to the  prior study. Electronically Signed   By: TVirgina NorfolkM.D.   On: 09/13/2019 18:13   DG Chest Port 1 View  Result Date: 09/13/2019 CLINICAL DATA:  Shortness of breath, COVID-19 positivity EXAM: PORTABLE CHEST 1 VIEW COMPARISON:  08/19/2019 FINDINGS: Cardiac shadow is stable. Patchy opacities are again seen throughout both lungs slightly increased from the prior exam consistent with the given clinical history of COVID-19 positivity. No sizable effusion is noted. No bony abnormality is seen. IMPRESSION: Slight increase in patchy opacities consistent with the given clinical history of COVID-19 positivity. Electronically Signed   By: MInez CatalinaM.D.   On: 09/13/2019 16:18   VAS [place]OWER EXTREMITY VENOUS (DVT)  Result Date: 09/14/2019  Lower Venous DVTStudy Indications: Covid +, elevated d-dimer=6.71.  Comparison Study: No prior study Performing Technologist: MMaudry MayhewMHA, RDMS, RVT, RDCS  Examination Guidelines: A complete evaluation includes B-mode imaging, spectral Doppler, color Doppler, and power Doppler as needed of all accessible portions of each vessel. Bilateral testing is considered an integral part of a complete examination. Limited examinations for reoccurring indications may be performed as noted. The reflux portion of the exam is performed with the patient in reverse Trendelenburg.  +---------+---------------+---------+-----------+----------+--------------+ RIGHT    CompressibilityPhasicitySpontaneityPropertiesThrombus Aging +---------+---------------+---------+-----------+----------+--------------+ CFV      Full           Yes      Yes                                 +---------+---------------+---------+-----------+----------+--------------+ SFJ      Full                                                        +---------+---------------+---------+-----------+----------+--------------+  FV Prox  Full                                                         +---------+---------------+---------+-----------+----------+--------------+ FV Mid   Full                                                        +---------+---------------+---------+-----------+----------+--------------+ FV DistalFull                                                        +---------+---------------+---------+-----------+----------+--------------+ PFV      Full                                                        +---------+---------------+---------+-----------+----------+--------------+ POP      Full           Yes      Yes                                 +---------+---------------+---------+-----------+----------+--------------+ PTV      None                    No                   Acute          +---------+---------------+---------+-----------+----------+--------------+ PERO     None                    No                   Acute          +---------+---------------+---------+-----------+----------+--------------+   +---------+---------------+---------+-----------+----------+--------------+ LEFT     CompressibilityPhasicitySpontaneityPropertiesThrombus Aging +---------+---------------+---------+-----------+----------+--------------+ CFV      Full           Yes      Yes                                 +---------+---------------+---------+-----------+----------+--------------+ SFJ      Full                                                        +---------+---------------+---------+-----------+----------+--------------+ FV Prox  Full                                                        +---------+---------------+---------+-----------+----------+--------------+  FV Mid   Full                                                        +---------+---------------+---------+-----------+----------+--------------+ FV DistalFull                                                         +---------+---------------+---------+-----------+----------+--------------+ PFV      Full                                                        +---------+---------------+---------+-----------+----------+--------------+ POP      Full           Yes      Yes                                 +---------+---------------+---------+-----------+----------+--------------+ PTV      None                    No                   Acute          +---------+---------------+---------+-----------+----------+--------------+ Gastroc  None                    No                   Acute          +---------+---------------+---------+-----------+----------+--------------+   Left Technical Findings: Not visualized segments include peroneal veins not adequately visualized.   Summary: RIGHT: - Findings consistent with acute deep vein thrombosis involving the right posterior tibial veins, and right peroneal veins. - No cystic structure found in the popliteal fossa.  LEFT: - Findings consistent with acute deep vein thrombosis involving the left posterior tibial veins, and left gastrocnemius veins. - No cystic structure found in the popliteal fossa.  *See table(s) above for measurements and observations.    Preliminary    [place] EKG SITE RITE  Result Date: 09/14/2019 If Site Rite image not attached, placement could not be confirmed due to current cardiac rhythm.

## 2019-09-14 NOTE — Progress Notes (Addendum)
Bilateral lower extremity venous duplex completed. Refer to "CV Proc" under chart review to view preliminary results.  Critical results sent to Dr. Isidoro Donning via Secure Chat, attempted page to Dr. Isidoro Donning, and discussed preliminary results with Kathlene November, RN.  09/14/2019 9:50 AM Eula Fried., MHA, RVT, RDCS, RDMS

## 2019-09-14 NOTE — ED Notes (Signed)
Pt lying in bed awake. Denies any  Needs. Full monitor on.

## 2019-09-14 NOTE — Progress Notes (Signed)
Securechat sent to RN re plan to place PICC 09/15/19

## 2019-09-14 NOTE — ED Notes (Signed)
RT called to bedside to adjust pt's o2 due to decreased sat level.

## 2019-09-14 NOTE — Progress Notes (Signed)
[facility]                                                                              Patient Demographics  Kristen Bender, is a 64 y.o. female, DOB - September 06, 1955, PXT:[id number]  Admit date - 09/13/2019   Admitting Physician John Giovanni, MD  Outpatient Primary MD for the patient is Bing Neighbors, FNP  Outpatient specialists:   LOS - 1  days   Medical records reviewed and are as summarized below:    Chief Complaint  Patient presents with  . COVID POSITIVE  . Shortness of Breath       Brief summary   Patient is a 64 year old female with history of anemia, anxiety, GERD, IBS, thyroid disease, recent hospitalization 3/15 to 09/10/2019 for acute hypoxic respiratory failure secondary to acute COVID-19 viral pneumonitis (patient was treated with IV steroids, remdesivir and Actemra, discharged on 2 L O2) Presented to ED with shortness of breath and wheezing, received epinephrine and Solu-Medrol via EMS.  Reported worsening shortness of breath since her recent hospital discharge.  Also reported coughing, O2 sats were in the 80s on her pulse ox despite 5 L O2 at home. CT angiogram of the chest negative for PE however showed marked severity patchy bilateral infiltrates, markedly increased in severity. COVID-19 positive   Assessment & Plan    Principal Problem:   Acute hypoxic respiratory failure due to acute COVID-19 viral pneumonia during the ongoing COVID-19 pandemic- POA - Patient presented with worsening shortness of breath, hypoxia, CT angiogram of the chest showed no PE but markedly worsened bilateral infiltrates - Currently hypoxic, requiring 15L HFNC. At the time of my examination, was on 60 L O2 HFNC in am -Patient has recently completed IV remdesivir during previous admission.  Will place on IV Solu-Medrol 40 mg every 8 hours, wean O2 as tolerated.   -Pulmonary critical care also consulted, discussed with Dr. Marchelle Gearing  -Continue IV vancomycin  and cefepime, albuterol, placed on Dulera, flutter valve - Continue Supportive care: vitamin C/zinc, albuterol, Tylenol. - strict I/O's, daily weights, keep dry, negative balance, will give Lasix 20 mg IV x1 - Continue to wean oxygen, ambulatory O2 screening daily as tolerated  - Oxygen - SpO2: 92 % O2 Flow Rate (L/min): 15 L/min FiO2 (%): 100 % - Continue to follow labs as below  Lab Results  Component Value Date   SARSCOV2NAA POSITIVE (A) 09/13/2019     Recent Labs  Lab 09/08/19 0405 09/09/19 0713 09/09/19 0916 09/10/19 0232 09/13/19 1533  DDIMER 1.39*  --  1.20* 1.18* 6.71*  FERRITIN  --   --   --   --  153  CRP 0.8 <0.5  --  0.6 0.5  ALT 46* 37  --  35 30  PROCALCITON  --   --   --   --  <0.10    Active Problems:   Leg DVT (deep venous thromboembolism), acute, bilateral (HCC) -Likely due to COVID-19 hypercoagulable state -D-dimer elevated 6.71, CT angiogram chest negative for PE -Venous Dopplers lower extremities positive for DVT in bilateral legs -Placed on IV heparin drip per pharmacy  Anxiety  -Continue sertraline, Klonopin dose increased to 1 mg twice daily.  Patient has significant anxiety issues during previous hospitalization.  Demand ischemia, mildly elevated troponin -EKG showed T wave inversions, slight ST depressions in anterolateral leads, likely due to #1 -Currently no chest pain, troponin trending down -2D echo pending  Obesity Estimated body mass index is 37.46 kg/m as calculated from the following:   Height as of this encounter:  (1.626 m).   Weight as of this encounter: 99 kg.  Code Status: Full CODE STATUS DVT Prophylaxis: Placed on IV heparin drip for acute bilateral DVT Family Communication: Discussed all imaging results, lab results, explained to the patient and extensively to the daughter on phone    Disposition Plan: Patient from home, anticipate discharge home once has recovered from the hypoxic respiratory failure, COVID-19  viral pneumonitis   Time Spent in minutes 40 minutes  Procedures:  CT angiogram of the chest  Consultants:   Pulmonary critical care  Antimicrobials:   Anti-infectives (From admission, onward)   Start     Dose/Rate Route Frequency Ordered Stop   09/14/19 2200  vancomycin (VANCOREADY) IVPB 1250 mg/250 mL     1,250 mg 166.7 mL/hr over 90 Minutes Intravenous Every 24 hours 09/13/19 2224     09/13/19 2230  vancomycin (VANCOREADY) IVPB 2000 mg/400 mL     2,000 mg 200 mL/hr over 120 Minutes Intravenous  Once 09/13/19 2224 09/14/19 0155   09/13/19 2230  ceFEPIme (MAXIPIME) 2 g in sodium chloride 0.9 % 100 mL IVPB     2 g 200 mL/hr over 30 Minutes Intravenous Every 8 hours 09/13/19 2224           Medications  Scheduled Meds: . vitamin C  500 mg Oral Daily  . chlorhexidine  15 mL Mouth Rinse BID  . Chlorhexidine Gluconate Cloth  6 each Topical Daily  . clonazePAM  1 mg Oral BID  . guaiFENesin  1,200 mg Oral BID  . loratadine  10 mg Oral Daily  . mouth rinse  15 mL Mouth Rinse q12n4p  . methylPREDNISolone (SOLU-MEDROL) injection  40 mg Intravenous Q8H  . sertraline  100 mg Oral Daily  . sodium chloride flush  10-40 mL Intracatheter Q12H  . zinc sulfate  220 mg Oral Daily   Continuous Infusions: . ceFEPime (MAXIPIME) IV 2 g (09/14/19 1324)  . vancomycin     PRN Meds:.acetaminophen, albuterol, sodium chloride flush      Subjective:   Kristen Bender was seen and examined today.  Alert and awake, still having significant shortness of breath, sitting up, not able to complete full sentences due to dyspnea.  No chest pain.  No acute fevers. Patient denies dizziness, chest pain, abdominal pain, N/V/D/C, new weakness, numbess, tingling  Objective:   Vitals:   09/14/19 1030 09/14/19 1130 09/14/19 1214 09/14/19 1315  BP: (!) 114/95 122/82 126/76   Pulse: 93 90 73 84  Resp: 19 18 (!) 22 (!) 25  Temp:      TempSrc:      SpO2: 97% 97% 98% 92%  Weight:      Height:          Intake/Output Summary (Last 24 hours) at 09/14/2019 1317 Last data filed at 09/14/2019 0155 Gross per 24 hour  Intake 499.6 ml  Output --  Net 499.6 ml     Wt Readings from Last 3 Encounters:  09/13/19 99 kg  09/05/19 99.1 kg  03/17/16 75.3 kg  Exam  General: Alert and oriented x 3, short of breath  Cardiovascular: S1 S2 auscultated, no murmurs, RRR  Respiratory: Tachypneic with coarse breath sounds bilaterally  Gastrointestinal: Soft, nontender, nondistended, + bowel sounds  Ext: no pedal edema bilaterally  Neuro: No new deficits  Musculoskeletal: No digital cyanosis, clubbing  Skin: No rashes  Psych: Somewhat anxious   Data Reviewed:  I have personally reviewed following labs and imaging studies  Micro Results Recent Results (from the past 240 hour(s))  Respiratory Panel by PCR     Status: None   Collection Time: 09/13/19 10:38 PM   Specimen: Nasopharyngeal Swab; Respiratory  Result Value Ref Range Status   Adenovirus NOT DETECTED NOT DETECTED Final   Coronavirus 229E NOT DETECTED NOT DETECTED Final    Comment: (NOTE) The Coronavirus on the Respiratory Panel, DOES NOT test for the novel  Coronavirus (2019 nCoV)    Coronavirus HKU1 NOT DETECTED NOT DETECTED Final   Coronavirus NL63 NOT DETECTED NOT DETECTED Final   Coronavirus OC43 NOT DETECTED NOT DETECTED Final   Metapneumovirus NOT DETECTED NOT DETECTED Final   Rhinovirus / Enterovirus NOT DETECTED NOT DETECTED Final   Influenza A NOT DETECTED NOT DETECTED Final   Influenza B NOT DETECTED NOT DETECTED Final   Parainfluenza Virus 1 NOT DETECTED NOT DETECTED Final   Parainfluenza Virus 2 NOT DETECTED NOT DETECTED Final   Parainfluenza Virus 3 NOT DETECTED NOT DETECTED Final   Parainfluenza Virus 4 NOT DETECTED NOT DETECTED Final   Respiratory Syncytial Virus NOT DETECTED NOT DETECTED Final   Bordetella pertussis NOT DETECTED NOT DETECTED Final   Chlamydophila pneumoniae NOT DETECTED NOT  DETECTED Final   Mycoplasma pneumoniae NOT DETECTED NOT DETECTED Final    Comment: Performed at [facility], [address] [place], [place] [address]  Respiratory Panel by RT PCR (Flu A&B, Covid) - Nasopharyngeal Swab     Status: Abnormal   Collection Time: 09/13/19 11:30 PM   Specimen: Nasopharyngeal Swab  Result Value Ref Range Status   SARS Coronavirus 2 by RT PCR POSITIVE (A) NEGATIVE Final    Comment: RESULT CALLED TO, READ BACK BY AND VERIFIED WITH: T,COLEY AT 0234 ON 09/14/19 BY A,MOHAMED (NOTE) SARS-CoV-2 target nucleic acids are DETECTED. SARS-CoV-2 RNA is generally detectable in upper respiratory specimens  during the acute phase of infection. Positive results are indicative of the presence of the identified virus, but do not rule out bacterial infection or co-infection with other pathogens not detected by the test. Clinical correlation with patient history and other diagnostic information is necessary to determine patient infection status. The expected result is Negative. Fact Sheet for Patients:  https://www.moore.com/https://www.fda.gov/media/142436/download Fact Sheet for Healthcare Providers: https://www.young.biz/https://www.fda.gov/media/142435/download This test is not yet approved or cleared by the [place] FDA and  has been authorized for detection and/or diagnosis of SARS-CoV-2 by FDA under an Emergency Use Authorization (EUA).  This EUA will remain in effect (meaning this test can be use d) for the duration of  the COVID-19 declaration under Section 564(b)(1) of the Act, 21 U.S.C. section 360bbb-3(b)(1), unless the authorization is terminated or revoked sooner.    Influenza A by PCR NEGATIVE NEGATIVE Final   Influenza B by PCR NEGATIVE NEGATIVE Final    Comment: (NOTE) The Xpert Xpress SARS-CoV-2/FLU/RSV assay is intended as an aid in  the diagnosis of influenza from Nasopharyngeal swab specimens and  should not be used as a sole basis for treatment. Nasal washings and  aspirates are  unacceptable for  Xpert Xpress SARS-CoV-2/FLU/RSV  testing. Fact Sheet for Patients: https://www.moore.com/ Fact Sheet for Healthcare Providers: https://www.young.biz/ This test is not yet approved or cleared by the [place] FDA and  has been authorized for detection and/or diagnosis of SARS-CoV-2 by  FDA under an Emergency Use Authorization (EUA). This EUA will remain  in effect (meaning this test can be used) for the duration of the  Covid-19 declaration under Section 564(b)(1) of the Act, 21  U.S.C. section 360bbb-3(b)(1), unless the authorization is  terminated or revoked. Performed at [facility], [address], [place], [place] [address]     Radiology Reports CT Angio Chest PE W and/or Wo Contrast  Result Date: 09/13/2019 CLINICAL DATA:  Shortness of breath. EXAM: CT ANGIOGRAPHY CHEST WITH CONTRAST TECHNIQUE: Multidetector CT imaging of the chest was performed using the standard protocol during bolus administration of intravenous contrast. Multiplanar CT image reconstructions and MIPs were obtained to evaluate the vascular anatomy. CONTRAST:  OMNIPAQUE IOHEXOL 350 MG/ML SOLN COMPARISON:  September 02, 2019 FINDINGS: Cardiovascular: Satisfactory opacification of the pulmonary arteries to the segmental level. No evidence of pulmonary embolism. Normal heart size. No pericardial effusion. Mediastinum/Nodes: No enlarged mediastinal, hilar, or axillary lymph nodes. Thyroid gland, trachea, and esophagus demonstrate no significant findings. Lungs/Pleura: Marked severity patchy infiltrates are seen throughout both lungs. This is markedly increased in severity when compared to the prior study. There is no evidence of a pleural effusion or pneumothorax. Upper Abdomen: A stable 3.6 cm x 2.2 cm cystic appearing area is seen within the posteromedial aspect of the right lobe of the liver. Musculoskeletal: No chest wall abnormality. No acute  or significant osseous findings. Review of the MIP images confirms the above findings. IMPRESSION: 1. No evidence of pulmonary embolism. 2. Marked severity patchy bilateral infiltrates, markedly increased in severity when compared to the prior study. Electronically Signed   By: Aram Candela M.D.   On: 09/13/2019 18:13   CT Angio Chest PE W and/or Wo Contrast  Result Date: 09/02/2019 CLINICAL DATA:  Shortness of breath. EXAM: CT ANGIOGRAPHY CHEST WITH CONTRAST TECHNIQUE: Multidetector CT imaging of the chest was performed using the standard protocol during bolus administration of intravenous contrast. Multiplanar CT image reconstructions and MIPs were obtained to evaluate the vascular anatomy. CONTRAST:  OMNIPAQUE IOHEXOL 350 MG/ML SOLN COMPARISON:  None. FINDINGS: Cardiovascular: Satisfactory opacification of the pulmonary arteries to the segmental level. No evidence of pulmonary embolism. Normal heart size. No pericardial effusion. Mediastinum/Nodes: No enlarged mediastinal, hilar, or axillary lymph nodes. Thyroid gland, trachea, and esophagus demonstrate no significant findings. Lungs/Pleura: Moderate severity diffuse areas of atelectasis and/or infiltrate are seen bilaterally, slightly more prominent within the bilateral lung bases. There is no evidence of a pleural effusion or pneumothorax. Upper Abdomen: A 3.7 cm x 2.1 cm cystic appearing areas seen within the posteromedial aspect of the right lobe of the liver. Musculoskeletal: Multilevel degenerative changes seen throughout the thoracic spine. Review of the MIP images confirms the above findings. IMPRESSION: 1. No evidence of acute pulmonary embolism. 2. Moderate severity diffuse areas of atelectasis and/or infiltrate bilaterally, slightly more prominent within the bilateral lung bases. 3. 3.7 cm cystic appearing lesion in the right lobe of the liver. Electronically Signed   By: Aram Candela M.D.   On: 09/02/2019 21:28   DG Chest Port 1  View  Result Date: 09/13/2019 CLINICAL DATA:  Shortness of breath, COVID-19 positivity EXAM: PORTABLE CHEST 1 VIEW COMPARISON:  08/19/2019 FINDINGS: Cardiac shadow is stable.  Patchy opacities are again seen throughout both lungs slightly increased from the prior exam consistent with the given clinical history of COVID-19 positivity. No sizable effusion is noted. No bony abnormality is seen. IMPRESSION: Slight increase in patchy opacities consistent with the given clinical history of COVID-19 positivity. Electronically Signed   By: Alcide Clever M.D.   On: 09/13/2019 16:18   DG Chest Port 1 View  Result Date: 09/08/2019 CLINICAL DATA:  COVID-19 positive.  Shortness of breath. EXAM: PORTABLE CHEST 1 VIEW COMPARISON:  September 06, 2019 FINDINGS: Bilateral pulmonary infiltrates are stable on the left and in the right base in the interval. The heart, hila, mediastinum, lungs, and pleura are otherwise unchanged. IMPRESSION: Stable bilateral pulmonary infiltrates. Electronically Signed   By: Gerome Sam III M.D   On: 09/08/2019 10:23   DG Chest Port 1 View  Result Date: 09/06/2019 CLINICAL DATA:  Shortness of breath EXAM: PORTABLE CHEST 1 VIEW COMPARISON:  09/02/2019 plain film and CT FINDINGS: Midline trachea. Mild cardiomegaly. Given differences in technique, similar left greater than right peripheral and basilar predominant interstitial opacities. Mild right hemidiaphragm elevation. No pleural effusion or pneumothorax. IMPRESSION: No significant change in basilar and peripheral predominant interstitial opacities, most consistent with COVID-19 pneumonia. Electronically Signed   By: Jeronimo Greaves M.D.   On: 09/06/2019 08:47   DG Chest Port 1 View  Result Date: 09/02/2019 CLINICAL DATA:  Short of breath. EXAM: PORTABLE CHEST 1 VIEW COMPARISON:  None FINDINGS: Cardiac enlargement. Decreased lung volumes. Bilateral peripheral and lower lobe predominant opacities are identified. No pleural effusion or edema.  IMPRESSION: Bilateral peripheral and lower lobe predominant opacities, which in the acute setting are concerning for multifocal pneumonia. Electronically Signed   By: Signa Kell M.D.   On: 09/02/2019 19:51   VAS [place] LOWER EXTREMITY VENOUS (DVT)  Result Date: 09/14/2019  Lower Venous DVTStudy Indications: Covid +, elevated d-dimer=6.71.  Comparison Study: No prior study Performing Technologist: Gertie Fey MHA, RDMS, RVT, RDCS  Examination Guidelines: A complete evaluation includes B-mode imaging, spectral Doppler, color Doppler, and power Doppler as needed of all accessible portions of each vessel. Bilateral testing is considered an integral part of a complete examination. Limited examinations for reoccurring indications may be performed as noted. The reflux portion of the exam is performed with the patient in reverse Trendelenburg.  +---------+---------------+---------+-----------+----------+--------------+ RIGHT    CompressibilityPhasicitySpontaneityPropertiesThrombus Aging +---------+---------------+---------+-----------+----------+--------------+ CFV      Full           Yes      Yes                                 +---------+---------------+---------+-----------+----------+--------------+ SFJ      Full                                                        +---------+---------------+---------+-----------+----------+--------------+ FV Prox  Full                                                        +---------+---------------+---------+-----------+----------+--------------+ FV Mid   Full                                                        +---------+---------------+---------+-----------+----------+--------------+  FV DistalFull                                                        +---------+---------------+---------+-----------+----------+--------------+ PFV      Full                                                         +---------+---------------+---------+-----------+----------+--------------+ POP      Full           Yes      Yes                                 +---------+---------------+---------+-----------+----------+--------------+ PTV      None                    No                   Acute          +---------+---------------+---------+-----------+----------+--------------+ PERO     None                    No                   Acute          +---------+---------------+---------+-----------+----------+--------------+   +---------+---------------+---------+-----------+----------+--------------+ LEFT     CompressibilityPhasicitySpontaneityPropertiesThrombus Aging +---------+---------------+---------+-----------+----------+--------------+ CFV      Full           Yes      Yes                                 +---------+---------------+---------+-----------+----------+--------------+ SFJ      Full                                                        +---------+---------------+---------+-----------+----------+--------------+ FV Prox  Full                                                        +---------+---------------+---------+-----------+----------+--------------+ FV Mid   Full                                                        +---------+---------------+---------+-----------+----------+--------------+ FV DistalFull                                                        +---------+---------------+---------+-----------+----------+--------------+  PFV      Full                                                        +---------+---------------+---------+-----------+----------+--------------+ POP      Full           Yes      Yes                                 +---------+---------------+---------+-----------+----------+--------------+ PTV      None                    No                   Acute           +---------+---------------+---------+-----------+----------+--------------+ Gastroc  None                    No                   Acute          +---------+---------------+---------+-----------+----------+--------------+   Left Technical Findings: Not visualized segments include peroneal veins not adequately visualized.   Summary: RIGHT: - Findings consistent with acute deep vein thrombosis involving the right posterior tibial veins, and right peroneal veins. - No cystic structure found in the popliteal fossa.  LEFT: - Findings consistent with acute deep vein thrombosis involving the left posterior tibial veins, and left gastrocnemius veins. - No cystic structure found in the popliteal fossa.  *See table(s) above for measurements and observations.    Preliminary     Lab Data:  CBC: Recent Labs  Lab 09/08/19 0405 09/09/19 0916 09/10/19 0232 09/13/19 1533 09/14/19 0400  WBC 9.2 12.0* 11.9* 13.5* 13.5*  NEUTROABS 7.2 10.3* 8.8* 12.6* 12.1*  HGB 13.3 15.1* 13.2 13.7 13.4  HCT 40.9 46.2* 40.6 42.0 42.5  MCV 97.6 97.9 97.1 100.0 100.2*  PLT 363 374 312 250 244   Basic Metabolic Panel: Recent Labs  Lab 09/08/19 0405 09/09/19 0713 09/10/19 0232 09/13/19 1533 09/14/19 0400  NA 135 135 135 138  --   K 4.0 4.8 3.7 3.9  --   CL 98 97* 99 104  --   CO2 --   GLUCOSE 106* 116* 105* 144*  --   BUN 23 25* 25* 14  --   CREATININE 0.67 0.77 0.80 0.69 0.65  CALCIUM 8.9 9.2 9.2 9.1  --   MG 2.1 2.1 2.1  --   --    GFR: Estimated Creatinine Clearance: 81.2 mL/min (by C-G formula based on SCr of 0.65 mg/dL). Liver Function Tests: Recent Labs  Lab 09/08/19 0405 09/09/19 0713 09/10/19 0232 09/13/19 1533  AST 24 35 25 21  ALT 46* 37 35 30  ALKPHOS 52 57 49 60  BILITOT 1.5* 2.3* 1.3* 1.5*  PROT 6.4* 6.7 6.4* 6.9  ALBUMIN 3.0* 3.3* 3.1* 3.7   No results for input(s): LIPASE, AMYLASE in the last 168 hours. No results for input(s): AMMONIA in the last 168  hours. Coagulation Profile: No results for input(s): INR, PROTIME in the last 168 hours. Cardiac Enzymes: No results for input(s): CKTOTAL, CKMB, CKMBINDEX, TROPONINI in the last 168 hours. BNP (  last 3 results) No results for input(s): PROBNP in the last 8760 hours. HbA1C: No results for input(s): HGBA1C in the last 72 hours. CBG: No results for input(s): GLUCAP in the last 168 hours. Lipid Profile: Recent Labs    09/13/19 1533  TRIG 104   Thyroid Function Tests: Recent Labs    09/14/19 0400  TSH 0.430   Anemia Panel: Recent Labs    09/13/19 1533  FERRITIN 153   Urine analysis: No results found for: COLORURINE, APPEARANCEUR, LABSPEC, PHURINE, GLUCOSEU, HGBUR, BILIRUBINUR, KETONESUR, PROTEINUR, UROBILINOGEN, NITRITE, LEUKOCYTESUR   Lexy Meininger M.D. [facility] 09/14/2019, 1:17 PM   Call night coverage person covering after 7pm

## 2019-09-14 NOTE — ED Notes (Signed)
Pt lying in bed. Full monitor on. Attempted UGIV for second line unsuccessful. Pt denies any needs. Will continue to monitor.

## 2019-09-14 NOTE — Progress Notes (Signed)
  Echocardiogram 2D Echocardiogram has been performed.  Kristen Bender 09/14/2019, 2:28 PM

## 2019-09-15 DIAGNOSIS — I82403 Acute embolism and thrombosis of unspecified deep veins of lower extremity, bilateral: Secondary | ICD-10-CM

## 2019-09-15 LAB — GLUCOSE, CAPILLARY
Glucose-Capillary: 125 mg/dL — ABNORMAL HIGH (ref 70–99)
Glucose-Capillary: 137 mg/dL — ABNORMAL HIGH (ref 70–99)
Glucose-Capillary: 244 mg/dL — ABNORMAL HIGH (ref 70–99)
Glucose-Capillary: 109 mg/dL — ABNORMAL HIGH (ref 70–99)
Glucose-Capillary: 103 mg/dL — ABNORMAL HIGH (ref 70–99)
Glucose-Capillary: 108 mg/dL — ABNORMAL HIGH (ref 70–99)

## 2019-09-15 LAB — BASIC METABOLIC PANEL
Anion gap: 9 (ref 5–15)
BUN: 23 mg/dL (ref 8–23)
CO2: 25 mmol/L (ref 22–32)
Calcium: 8.3 mg/dL — ABNORMAL LOW (ref 8.9–10.3)
Chloride: 102 mmol/L (ref 98–111)
Creatinine, Ser: 0.67 mg/dL (ref 0.44–1.00)
GFR calc Af Amer: >60 mL/min (ref >60)
GFR calc non Af Amer: >60 mL/min (ref >60)
Glucose, Bld: 106 mg/dL — ABNORMAL HIGH (ref 70–99)
Potassium: 4.5 mmol/L (ref 3.5–5.1)
Sodium: 136 mmol/L (ref 135–145)

## 2019-09-15 LAB — C-REACTIVE PROTEIN: CRP: <0.5 mg/dL (ref <1.0)

## 2019-09-15 LAB — CBC
HCT: 41.7 % (ref 36.0–46.0)
Hemoglobin: 13.8 g/dL (ref 12.0–15.0)
MCH: 33.3 pg (ref 26.0–34.0)
MCHC: 33.1 g/dL (ref 30.0–36.0)
MCV: 100.5 fL — ABNORMAL HIGH (ref 80.0–100.0)
Platelets: 242 10*3/uL (ref 150–400)
RBC: 4.15 MIL/uL (ref 3.87–5.11)
RDW: 13.4 % (ref 11.5–15.5)
WBC: 15.1 10*3/uL — ABNORMAL HIGH (ref 4.0–10.5)
nRBC: 0 % (ref 0.0–0.2)

## 2019-09-15 LAB — D-DIMER, QUANTITATIVE: D-Dimer, Quant: 3.23 ug/mL-FEU — ABNORMAL HIGH (ref 0.00–0.50)

## 2019-09-15 LAB — HEPARIN LEVEL (UNFRACTIONATED)
Heparin Unfractionated: 0.81 IU/mL — ABNORMAL HIGH (ref 0.30–0.70)
Heparin Unfractionated: 0.36 IU/mL (ref 0.30–0.70)

## 2019-09-15 LAB — FERRITIN: Ferritin: 164 ng/mL (ref 11–307)

## 2019-09-15 MED ORDER — FUROSEMIDE 10 MG/ML IJ SOLN
20.0000 mg | Freq: Every day | INTRAMUSCULAR | Status: DC
  Administered 2019-09-15 – 2019-09-24 (×10): 20 mg via INTRAVENOUS
  Filled 2019-09-15 (×10): qty 2

## 2019-09-15 MED ORDER — METHYLPREDNISOLONE SODIUM SUCC 125 MG IJ SOLR: 60.0000 mg | Freq: Every day | INTRAMUSCULAR | Status: DC

## 2019-09-15 MED ORDER — METHYLPREDNISOLONE SODIUM SUCC 40 MG IJ SOLR
40.0000 mg | Freq: Two times a day (BID) | INTRAMUSCULAR | Status: DC
  Administered 2019-09-15 – 2019-09-20 (×11): 40 mg via INTRAVENOUS
  Filled 2019-09-15 (×11): qty 1

## 2019-09-15 MED ORDER — HEPARIN (PORCINE) 25000 UT/250ML-% IV SOLN
650.0000 [IU]/h | INTRAVENOUS | Status: DC
  Administered 2019-09-15: 650 [IU]/h via INTRAVENOUS
  Filled 2019-09-15: qty 250

## 2019-09-15 NOTE — Progress Notes (Signed)
Spoke with RN re PICC order.  Secure chat sent to Dr Caprice Red and RN re d/c PICC order, only heparin IV on MAR, has 2 PIV's.

## 2019-09-15 NOTE — Progress Notes (Signed)
ANTICOAGULATION CONSULT NOTE - Initial Consult  Pharmacy Consult for heparin Indication: DVT  No Known Allergies  Patient Measurements: Height: 5\' 4"  (162.6 cm) Weight: 218 lb 4.1 oz (99 kg) IBW/kg (Calculated) : 54.7 Heparin Dosing Weight: 78 kg  Vital Signs: Temp: 97.8 F (36.6 C) (03/28 0824) Temp Source: Oral (03/28 0824) BP: 117/65 (03/28 0800) Pulse Rate: 67 (03/28 0800)  Labs: Recent Labs    09/13/19 1533 09/13/19 1533 09/13/19 1800 09/14/19 0400 09/14/19 1404 09/14/19 1445 09/14/19 1447 09/14/19 2030 09/15/19 0700  HGB 13.7   < >  --  13.4  --   --   --   --  13.8  HCT 42.0  --   --  42.5  --   --   --   --  41.7  PLT 250  --   --  244  --   --   --   --  242  APTT  --   --   --   --  47*  --   --   --   --   LABPROT  --   --   --   --  14.2  --   --   --   --   INR  --   --   --   --  1.1  --   --   --   --   HEPARINUNFRC  --   --   --   --   --   --   --  1.46* 0.81*  CREATININE 0.69  --   --  0.65  --   --   --   --  0.67  CKTOTAL  --   --   --   --   --  129  --   --   --   CKMB  --   --   --   --   --  3.9  --   --   --   TROPONINIHS 18*  --  17  --   --   --  21*  --   --    < > = values in this interval not displayed.    Estimated Creatinine Clearance: 81.2 mL/min (by C-G formula based on SCr of 0.67 mg/dL).   Medical History: Past Medical History:  Diagnosis Date  . Anemia   . Anxiety   . GERD (gastroesophageal reflux disease)   . IBS (irritable bowel syndrome)   . Thyroid disease     Medications: Pt was not taking anticoagulants PTA -Enoxaparin 40 mg subQ dose given 3/27 @ 0950  Assessment: Pharmacy consulted to dose/monitor heparin in this 64 year old female recently hospitalized with COVID PNA. Pt presented to ED with complaints of SOB, venous dopplar on 3/27 revealed bilateral acute DVT. CTA 3/26: No evidence of PE. Pt not taking anticoagulants PTA.  Today, 09/15/19  CBC: Hgb/Plt both WNL & stable  HL = 0.81 remains  supratherapeutic despite decreasing rate of heparin to 800 units/hr  Confirmed with RN that heparin infusing at correct rate. No signs of bleeding. Labs drawn correctly: heparin infusing into PIV in left AC; HL drawn from midline.  SCr 0.67, CrCl ~80 mL/min. Stable  Goal of Therapy:  Heparin level 0.3-0.7 units/ml Monitor platelets by anticoagulation protocol: Yes   Plan:   Decrease heparin infusion to 650 units/hr  Check HL in 6 hours  CBC, HL daily while on heparin infusion  Monitor for signs and  symptoms of bleeding  Follow along for eventual transition to PO anticoagulation  Lenis Noon, PharmD 09/15/2019,8:58 AM

## 2019-09-15 NOTE — Consult Note (Signed)
NAME:  Kristen Bender, MRN:  [id number], DOB:  Nov 15, 1955, LOS: 2 ADMISSION DATE:  09/13/2019, CONSULTATION DATE:  09/14/2019 REFERRING MD:  Dr Tana Coast, CHIEF COMPLAINT: acute hypoxemic resp failure  Brief History   bounceback with worsening hypoxemia and new bilateral DVT post covid Rx  History of present illness    64 year old [place] lady from [place] originally.  Works at [employer] in [place].  She was admitted between September 02, 2019 and September 10, 2019 with COVID-19 acute hypoxic respiratory failure.She was treated with oxygen via nonrebreather mask, IV steroids remdesivir and Tocilizumab.  At the time of discharge she was discharged on 2 L nasal cannula.  90% on 4 L.  Course was complicated by anxiety and requiring psychiatry consultation.  She got readmitted September 13, 2019 with shortness of breath, wheezing and worsening hypoxemia.  Currently on heated high flow nasal cannula.  Her procalcitonin is less than 0.1.  CRP is essentially normal as seen below.  D-dimer significantly elevated but CT angiogram ruled out pulmonary embolism [CT did show worsening infiltrates compared to her admission CT] patient is being started on IV steroids.  Doppler ultrasounds have resulted in bilateral DVT acute.  IV heparin has been started by hospitalist.  Pulmonary critical care has been called for consultation.  Patient is wondering if she can be discharged home but she is feeling okay.   She also suffers from acid reflux disease, irritable bowel syndrome thyroid disorder. [facility] from prior hospitalization indicated significant anxiety disorder  Results for MONZERRATH, MCBURNEY (MRN [id number]) as of 09/14/2019 15:27  Ref. Range 09/08/2019 04:05 09/09/2019 07:13 09/10/2019 02:32 09/13/2019 15:33  CRP Latest Ref Range: <1.0 mg/dL 0.8 <0.5 0.6 0.5   Results for ARILYN, BRIERLEY (MRN [id number]) as of 09/14/2019 15:27  Ref. Range 09/07/2019 03:56 09/08/2019 04:05 09/09/2019 09:16 09/10/2019 02:32 09/13/2019  15:33  D-Dimer, America Brown Latest Ref Range: 0.00 - 0.50 ug/mL-FEU 0.83 (H) 1.39 (H) 1.20 (H) 1.18 (H) 6.71 (H)   Past Medical History     has a past medical history of Anemia, Anxiety, GERD (gastroesophageal reflux disease), IBS (irritable bowel syndrome), and Thyroid disease.   reports that she has never smoked. She has never used smokeless tobacco.  Past Surgical History:  Procedure Laterality Date  . CESAREAN SECTION     X 2    No Known Allergies   There is no immunization history on file for this patient.  Family History  Problem Relation Age of Onset  . Heart disease Mother   . Hypertension Mother   . Mental illness Mother   . Heart disease Father   . Hypertension Father      Current Facility-Administered Medications:  .  acetaminophen (TYLENOL) tablet 650 mg, 650 mg, Oral, Q6H PRN, Shela Leff, MD .  albuterol (VENTOLIN HFA) 108 (90 Base) MCG/ACT inhaler 1-2 puff, 1-2 puff, Inhalation, Q6H PRN, Shela Leff, MD .  ascorbic acid (VITAMIN C) tablet 500 mg, 500 mg, Oral, Daily, Rai, Ripudeep K, MD, 500 mg at 09/15/19 0903 .  chlorhexidine (PERIDEX) 0.12 % solution 15 mL, 15 mL, Mouth Rinse, BID, Rai, Ripudeep K, MD, 15 mL at 09/15/19 0903 .  Chlorhexidine Gluconate Cloth 2 % PADS 6 each, 6 each, Topical, Daily, Rai, Ripudeep K, MD, 6 each at 09/15/19 1030 .  clonazePAM (KLONOPIN) tablet 1 mg, 1 mg, Oral, BID, Rai, Ripudeep K, MD, 1 mg at 09/15/19 0903 .  furosemide (LASIX) injection 20 mg, 20 mg, Intravenous, Daily, Rai, Vernelle Emerald, MD,  20 mg at 09/15/19 1221 .  guaiFENesin (MUCINEX) 12 hr tablet 1,200 mg, 1,200 mg, Oral, BID, Rai, Ripudeep K, MD, 1,200 mg at 09/15/19 0904 .  heparin ADULT infusion 100 units/mL (25000 units/235m sodium chloride 0.45%), 650 Units/hr, Intravenous, Continuous, SLenis Noon RPH, Last Rate: 6.5 mL/hr at 09/15/19 1030, 650 Units/hr at 09/15/19 1030 .  loratadine (CLARITIN) tablet 10 mg, 10 mg, Oral, Daily, RShela Leff MD, 10  mg at 09/15/19 0903 .  MEDLINE mouth rinse, 15 mL, Mouth Rinse, q12n4p, Rai, Ripudeep K, MD, 15 mL at 09/15/19 1537 .  mometasone-formoterol (DULERA) 200-5 MCG/ACT inhaler 2 puff, 2 puff, Inhalation, BID, Rai, Ripudeep K, MD, 2 puff at 09/15/19 0858 .  sertraline (ZOLOFT) tablet 100 mg, 100 mg, Oral, Daily, RShela Leff MD, 100 mg at 09/15/19 0903 .  sodium chloride flush (NS) 0.9 % injection 10-40 mL, 10-40 mL, Intracatheter, Q12H, RShela Leff MD, 10 mL at 09/15/19 0904 .  sodium chloride flush (NS) 0.9 % injection 10-40 mL, 10-40 mL, Intracatheter, PRN, RShela Leff MD .  zinc sulfate capsule 220 mg, 220 mg, Oral, Daily, Rai, Ripudeep K, MD, 220 mg at 09/15/19 [facility]   09/[facility]/2019 - admit  Consults:  3/27 - cccm consult  Procedures:    Significant Diagnostic Tests:  3/27 - duplex LE - DVT +  Micro Data:  RVP 3/26 - neg Urine leg 3/26 - neg Autooimmune and vasculitis 3/27 -  ESR 3/27 -   Antimicrobials:   vanc 3/26 >>3/27 Cefepime 3/26 >>3/27    Interim history/subjective:  Denies new complaints. Breathing is doing better.  Objective   Blood pressure 104/85, pulse 91, temperature 97.8 F (36.6 C), temperature source Axillary, resp. rate 20, height _0  (1.626 m), weight 99 kg, SpO2 91 %.        Intake/Output Summary (Last 24 hours) at 09/15/2019 1613 Last data filed at 09/15/2019 1223 Gross per 24 hour  Intake 480 ml  Output 1350 ml  Net -870 ml   Filed Weights   09/13/19 1415  Weight: 99 kg    Examination: General: Elderly woman sitting up in bed no acute distress HENT: [place]/AT, eyes anicteric Lungs: Rales bilaterally, breathing comfortably on room air.  No tachypnea.  Saturating greater than 90% on 13 L nasal cannula.   Cardiovascular: Regular rate and rhythm, no murmurs Abdomen: Soft, nontender, nondistended Extremities: Mild left lower extremity edema.  No clubbing or cyanosis Neuro: Awake and alert, moving  all extremities spontaneously.  Answering questions appropriately with normal speech.  Resolved Hospital Problem list     Assessment & Plan:  Acute hypoxic respiratory failure with pulmonary infiltrates -bounce back in worsening after successful treatment in the initial phase of COVID-19 pneumonia. Originally diagnosed 3/15, hospitalized until 3/23 and discharged on 2L [place]. CTA with worse bilateral peripheral infiltrates. COP seems less likely with low ESR, but  -Solu-Medrol previously discontinued given normal CRP and ESR. May need to restart if worsening. -Continue to titrate down supplemental oxygen as able to maintain SPO2 greater than 88% and appropriate work of breathing -Recommend out of bed mobility and awake proning -No role for antibiotics with undetectable procalcitonin level -Autoimmune serologies pending. Will need outpatient follow up with Pulmonology.  Bilateral lower extremity DVTs -Currently on heparin.  Okay to transition to DOAC regimen to complete minimum 3 months for provoked DVT.    Ok to transfer to floor given stable respiratory status.  Best practice:  Per primary  LABS    PULMONARY Recent Labs  Lab 09/13/19 2116 09/14/19 0230  PHART 7.451* 7.442  PCO2ART 37.6 37.6  PO2ART 53.7* 57.6*  HCO3 25.8 25.4  O2SAT 87.8 90.2    CBC Recent Labs  Lab 09/13/19 1533 09/14/19 0400 09/15/19 0700  HGB 13.7 13.4 13.8  HCT 42.0 42.5 41.7  WBC 13.5* 13.5* 15.1*  PLT 250 244 242    COAGULATION Recent Labs  Lab 09/14/19 1404  INR 1.1    CARDIAC  No results for input(s): TROPONINI in the last 168 hours. No results for input(s): PROBNP in the last 168 hours.   CHEMISTRY Recent Labs  Lab 09/09/19 0713 09/09/19 0713 09/10/19 0232 09/10/19 0232 09/13/19 1533 09/14/19 0400 09/15/19 0700  NA 135  --  135  --  138  --  136  K 4.8   < > 3.7   < > 3.9  --  4.5  CL 97*  --  99  --  104  --  102  CO2 24  --  25  --  24  --  25  GLUCOSE 116*  --   105*  --  144*  --  106*  BUN 25*  --  25*  --  14  --  23  CREATININE 0.77  --  0.80  --  0.69 0.65 0.67  CALCIUM 9.2  --  9.2  --  9.1  --  8.3*  MG 2.1  --  2.1  --   --   --   --    < > = values in this interval not displayed.   Estimated Creatinine Clearance: 81.2 mL/min (by C-G formula based on SCr of 0.67 mg/dL).   LIVER Recent Labs  Lab 09/09/19 0713 09/10/19 0232 09/13/19 1533 09/14/19 1404  AST 35 25 21  --   ALT 37 35 30  --   ALKPHOS 57 49 60  --   BILITOT 2.3* 1.3* 1.5*  --   PROT 6.7 6.4* 6.9  --   ALBUMIN 3.3* 3.1* 3.7  --   INR  --   --   --  1.1     INFECTIOUS Recent Labs  Lab 09/13/19 1532 09/13/19 1533 09/13/19 1800 09/14/19 1448  LATICACIDVEN 1.9  --  1.7 1.7  PROCALCITON  --  <0.10  --   --      ENDOCRINE CBG (last 3)  Recent Labs    09/15/19 0337 09/15/19 0737 09/15/19 1155  GLUCAP 125* [place] Felton Buczynski, DO 09/15/19 4:17 PM [facility]

## 2019-09-15 NOTE — Progress Notes (Signed)
ANTICOAGULATION CONSULT NOTE  Pharmacy Consult for heparin Indication: DVT  No Known Allergies  Patient Measurements: Height: 5\' 4"  (162.6 cm) Weight: 218 lb 4.1 oz (99 kg) IBW/kg (Calculated) : 54.7 Heparin Dosing Weight: 78 kg  Vital Signs: Temp: 97.8 F (36.6 C) (03/28 1238) Temp Source: Axillary (03/28 1238) BP: 92/65 (03/28 1700) Pulse Rate: 87 (03/28 1700)  Labs: Recent Labs    09/13/19 1533 09/13/19 1533 09/13/19 1800 09/14/19 0400 09/14/19 1404 09/14/19 1445 09/14/19 1447 09/14/19 2030 09/15/19 0700 09/15/19 1630  HGB 13.7   < >  --  13.4  --   --   --   --  13.8  --   HCT 42.0  --   --  42.5  --   --   --   --  41.7  --   PLT 250  --   --  244  --   --   --   --  242  --   APTT  --   --   --   --  47*  --   --   --   --   --   LABPROT  --   --   --   --  14.2  --   --   --   --   --   INR  --   --   --   --  1.1  --   --   --   --   --   HEPARINUNFRC  --   --   --   --   --   --   --  1.46* 0.81* 0.36  CREATININE 0.69  --   --  0.65  --   --   --   --  0.67  --   CKTOTAL  --   --   --   --   --  129  --   --   --   --   CKMB  --   --   --   --   --  3.9  --   --   --   --   TROPONINIHS 18*  --  17  --   --   --  21*  --   --   --    < > = values in this interval not displayed.    Estimated Creatinine Clearance: 81.2 mL/min (by C-G formula based on SCr of 0.67 mg/dL).   Medical History: Past Medical History:  Diagnosis Date  . Anemia   . Anxiety   . GERD (gastroesophageal reflux disease)   . IBS (irritable bowel syndrome)   . Thyroid disease     Medications: Pt was not taking anticoagulants PTA -Enoxaparin 40 mg subQ dose given 3/27 @ 0950  Assessment: Pharmacy consulted to dose/monitor heparin in this 64 year old female recently hospitalized with COVID PNA. Pt presented to ED with complaints of SOB, venous doppler on 3/27 revealed bilateral acute DVT. CTA 3/26: No evidence of PE. Pt not taking anticoagulants PTA.  Today, 09/15/19  CBC:  Hgb/Plt both WNL & stable  0700 HL = 0.81 remains supratherapeutic despite decreasing rate of heparin to 800 units/hr  Confirmed with RN that heparin infusing at correct rate. No signs of bleeding. Labs drawn correctly: heparin infusing into PIV in left AC; HL drawn from midline.  SCr 0.67, CrCl ~80 mL/min. Stable  1630 HL = 0.36 units/ml, in low therapeutic range. Slight blood on tissue  when blew nose  Goal of Therapy:  Heparin level 0.3-0.7 units/ml Monitor platelets by anticoagulation protocol: Yes   Plan:   Continue heparin infusion at 650 units/hr  CBC, HL daily while on heparin infusion  Monitor for signs and symptoms of bleeding  Follow along for eventual transition to PO anticoagulation  Otho Bellows, PharmD 09/15/2019,5:41 PM

## 2019-09-15 NOTE — Progress Notes (Signed)
[facility]                                                                              Patient Demographics  Kristen Bender, is a 64 y.o. female, DOB - 02-28-1956, ZOX:[id number]  Admit date - 09/13/2019   Admitting Physician John Giovanni, MD  Outpatient Primary MD for the patient is Bing Neighbors, FNP  Outpatient specialists:   LOS - 2  days   Medical records reviewed and are as summarized below:    Chief Complaint  Patient presents with  . COVID POSITIVE  . Shortness of Breath       Brief summary   Patient is a 64 year old female with history of anemia, anxiety, GERD, IBS, thyroid disease, recent hospitalization 3/15 to 09/10/2019 for acute hypoxic respiratory failure secondary to acute COVID-19 viral pneumonitis (patient was treated with IV steroids, remdesivir and Actemra, discharged on 2 L O2) Presented to ED with shortness of breath and wheezing, received epinephrine and Solu-Medrol via EMS.  Reported worsening shortness of breath since her recent hospital discharge.  Also reported coughing, O2 sats were in the 80s on her pulse ox despite 5 L O2 at home. CT angiogram of the chest negative for PE however showed marked severity patchy bilateral infiltrates, markedly increased in severity. COVID-19 positive   Assessment & Plan    Principal Problem:   Acute hypoxic respiratory failure due to acute COVID-19 viral pneumonia during the ongoing COVID-19 pandemic- POA - Patient presented with worsening shortness of breath, hypoxia, CT angiogram of the chest showed no PE but markedly worsened bilateral infiltrates -Still hypoxic, on O2 14 L HFNC, significant improvement from 60 L O2 in ED yesterday.   -Patient has recently completed IV remdesivir during previous admission.   -Pulmonary critical care also consulted, discussed with Dr. Marchelle Gearing  -Continue albuterol, placed on Dulera, flutter valve - Continue Supportive care: vitamin C/zinc,  albuterol, Tylenol. -Pulmonology following, autoimmune and vasculitis panel in process, steroids on hold -Antibiotics discontinued on 3/27 - strict I/O's, daily weights, keep dry and negative balance, currently net -633cc, continue IV Lasix - Continue to wean oxygen, ambulatory O2 screening daily as tolerated  - Oxygen - SpO2: 94 % O2 Flow Rate (L/min): 14 L/min FiO2 (%): 100 % - Continue to follow labs as below  Lab Results  Component Value Date   SARSCOV2NAA POSITIVE (A) 09/13/2019     Recent Labs  Lab 09/09/19 0713 09/09/19 0916 09/10/19 0232 09/13/19 1533 09/15/19 0700  DDIMER  --  1.20* 1.18* 6.71* 3.23*  FERRITIN  --   --   --  153 164  CRP <0.5  --  0.6 0.5 <0.5  ALT 37  --  35 30  --   PROCALCITON  --   --   --  <0.10  --     Active Problems:   Leg DVT (deep venous thromboembolism), acute, bilateral (HCC) - Likely due to COVID-19 hypercoagulable state - D-dimer elevated 6.71, CT angiogram chest negative for PE - Venous Dopplers lower extremities positive for DVT in bilateral legs - Continue IV heparin drip, case management consult for co-pays for NOACs  Anxiety  -Continue sertraline, Klonopin dose increased to 1 mg twice daily.    Demand ischemia, mildly elevated troponin -EKG showed T wave inversions, slight ST depressions in anterolateral leads, likely due to #1 Currently no chest pain.  2D echo 3/27 showed EF of 60 to 65%, normal left ventricular and right ventricular function  Obesity Estimated body mass index is 37.46 kg/m as calculated from the following:   Height as of this encounter:  (1.626 m).   Weight as of this encounter: 99 kg.  Code Status: Full CODE STATUS DVT Prophylaxis: Placed on IV heparin drip for acute bilateral DVT Family Communication: Discussed all imaging results, lab results, explained to the patient and daughter on the phone today   Disposition Plan: Patient from home, anticipate discharge home once has recovered from the  hypoxic respiratory failure, COVID-19 viral pneumonitis   Time Spent in minutes 25 minutes  Procedures:  CT angiogram of the chest  Consultants:   Pulmonary critical care  Antimicrobials:   Anti-infectives (From admission, onward)   Start     Dose/Rate Route Frequency Ordered Stop   09/14/19 2200  vancomycin (VANCOREADY) IVPB 1250 mg/250 mL  Status:  Discontinued     1,250 mg 166.7 mL/hr over 90 Minutes Intravenous Every 24 hours 09/13/19 2224 09/14/19 1550   09/13/19 2230  vancomycin (VANCOREADY) IVPB 2000 mg/400 mL     2,000 mg 200 mL/hr over 120 Minutes Intravenous  Once 09/13/19 2224 09/14/19 0155   09/13/19 2230  ceFEPIme (MAXIPIME) 2 g in sodium chloride 0.9 % 100 mL IVPB  Status:  Discontinued     2 g 200 mL/hr over 30 Minutes Intravenous Every 8 hours 09/13/19 2224 09/14/19 1550         Medications  Scheduled Meds: . vitamin C  500 mg Oral Daily  . chlorhexidine  15 mL Mouth Rinse BID  . Chlorhexidine Gluconate Cloth  6 each Topical Daily  . clonazePAM  1 mg Oral BID  . guaiFENesin  1,200 mg Oral BID  . loratadine  10 mg Oral Daily  . mouth rinse  15 mL Mouth Rinse q12n4p  . mometasone-formoterol  2 puff Inhalation BID  . sertraline  100 mg Oral Daily  . sodium chloride flush  10-40 mL Intracatheter Q12H  . zinc sulfate  220 mg Oral Daily   Continuous Infusions: . heparin 650 Units/hr (09/15/19 1030)   PRN Meds:.acetaminophen, albuterol, sodium chloride flush      Subjective:   Kristen Bender was seen and examined today.  Starting to feel better.  Still has shortness of breath on 15 L O2 HFNC this morning.  No fevers. Patient denies dizziness, chest pain, abdominal pain, N/V/D/C, new weakness, numbess, tingling  Objective:   Vitals:   09/15/19 0900 09/15/19 0904 09/15/19 1000 09/15/19 1035  BP: 106/66  (!) 116/52   Pulse: 74  74 82  Resp: (!) 24  (!) 28 (!) 25  Temp:      TempSrc:      SpO2: 91% (!) 89% 93% 94%  Weight:      Height:          Intake/Output Summary (Last 24 hours) at 09/15/2019 1057 Last data filed at 09/15/2019 0600 Gross per 24 hour  Intake 716.94 ml  Output 1850 ml  Net -1133.06 ml     Wt Readings from Last 3 Encounters:  09/13/19 99 kg  09/05/19 99.1 kg  03/17/16 75.3 kg    Physical Exam  General: Alert  and oriented x 3, NAD  Cardiovascular: S1 S2 clear, RRR. No pedal edema b/l  Respiratory: Diminished breath sounds with scattered rhonchi  Gastrointestinal: Soft, nontender, nondistended, NBS  Ext: no pedal edema bilaterally  Neuro: no new deficits  Musculoskeletal: No cyanosis, clubbing  Skin: No rashes  Psych: Normal affect and demeanor, alert and oriented x3     Data Reviewed:  I have personally reviewed following labs and imaging studies  Micro Results Recent Results (from the past 240 hour(s))  Blood Culture (routine x 2)     Status: None (Preliminary result)   Collection Time: 09/13/19  3:33 PM   Specimen: BLOOD  Result Value Ref Range Status   Specimen Description   Final    BLOOD RIGHT ANTECUBITAL Performed at [facility], [address] [place], [place] [address]    Special Requests   Final    BOTTLES DRAWN AEROBIC AND ANAEROBIC Blood Culture adequate volume Performed at [facility], [address], [place], [place] [address]    Culture   Final    NO GROWTH 2 DAYS Performed at [facility], [address], [place], [place] [address]    Report Status PENDING  Incomplete  Respiratory Panel by PCR     Status: None   Collection Time: 09/13/19 10:38 PM   Specimen: Nasopharyngeal Swab; Respiratory  Result Value Ref Range Status   Adenovirus NOT DETECTED NOT DETECTED Final   Coronavirus 229E NOT DETECTED NOT DETECTED Final    Comment: (NOTE) The Coronavirus on the Respiratory Panel, DOES NOT test for the novel  Coronavirus (2019 nCoV)    Coronavirus HKU1 NOT DETECTED NOT DETECTED Final   Coronavirus NL63 NOT  DETECTED NOT DETECTED Final   Coronavirus OC43 NOT DETECTED NOT DETECTED Final   Metapneumovirus NOT DETECTED NOT DETECTED Final   Rhinovirus / Enterovirus NOT DETECTED NOT DETECTED Final   Influenza A NOT DETECTED NOT DETECTED Final   Influenza B NOT DETECTED NOT DETECTED Final   Parainfluenza Virus 1 NOT DETECTED NOT DETECTED Final   Parainfluenza Virus 2 NOT DETECTED NOT DETECTED Final   Parainfluenza Virus 3 NOT DETECTED NOT DETECTED Final   Parainfluenza Virus 4 NOT DETECTED NOT DETECTED Final   Respiratory Syncytial Virus NOT DETECTED NOT DETECTED Final   Bordetella pertussis NOT DETECTED NOT DETECTED Final   Chlamydophila pneumoniae NOT DETECTED NOT DETECTED Final   Mycoplasma pneumoniae NOT DETECTED NOT DETECTED Final    Comment: Performed at [facility], [address] [place], [place] [address]  Respiratory Panel by RT PCR (Flu A&B, Covid) - Nasopharyngeal Swab     Status: Abnormal   Collection Time: 09/13/19 11:30 PM   Specimen: Nasopharyngeal Swab  Result Value Ref Range Status   SARS Coronavirus 2 by RT PCR POSITIVE (A) NEGATIVE Final    Comment: RESULT CALLED TO, READ BACK BY AND VERIFIED WITH: T,COLEY AT 0234 ON 09/14/19 BY A,MOHAMED (NOTE) SARS-CoV-2 target nucleic acids are DETECTED. SARS-CoV-2 RNA is generally detectable in upper respiratory specimens  during the acute phase of infection. Positive results are indicative of the presence of the identified virus, but do not rule out bacterial infection or co-infection with other pathogens not detected by the test. Clinical correlation with patient history and other diagnostic information is necessary to determine patient infection status. The expected result is Negative. Fact Sheet for Patients:  https://www.moore.com/ Fact Sheet for Healthcare Providers: https://www.young.biz/ This test is not yet approved or cleared by the  [place] [employer] and  has been authorized for  detection and/or diagnosis of SARS-CoV-2 by FDA under an TEFL teacher (EUA).  This EUA will remain in effect (meaning this test can be use d) for the duration of  the COVID-19 declaration under Section 564(b)(1) of the Act, 21 U.S.C. section 360bbb-3(b)(1), unless the authorization is terminated or revoked sooner.    Influenza A by PCR NEGATIVE NEGATIVE Final   Influenza B by PCR NEGATIVE NEGATIVE Final    Comment: (NOTE) The Xpert Xpress SARS-CoV-2/FLU/RSV assay is intended as an aid in  the diagnosis of influenza from Nasopharyngeal swab specimens and  should not be used as a sole basis for treatment. Nasal washings and  aspirates are unacceptable for Xpert Xpress SARS-CoV-2/FLU/RSV  testing. Fact Sheet for Patients: https://www.moore.com/ Fact Sheet for Healthcare Providers: https://www.young.biz/ This test is not yet approved or cleared by the [place] FDA and  has been authorized for detection and/or diagnosis of SARS-CoV-2 by  FDA under an Emergency Use Authorization (EUA). This EUA will remain  in effect (meaning this test can be used) for the duration of the  Covid-19 declaration under Section 564(b)(1) of the Act, 21  U.S.C. section 360bbb-3(b)(1), unless the authorization is  terminated or revoked. Performed at [facility], [address], [place], [place] [address]   MRSA PCR Screening     Status: None   Collection Time: 09/14/19  1:34 PM  Result Value Ref Range Status   MRSA by PCR NEGATIVE NEGATIVE Final    Comment:        The GeneXpert MRSA Assay (FDA approved for NASAL specimens only), is one component of a comprehensive MRSA colonization surveillance program. It is not intended to diagnose MRSA infection nor to guide or monitor treatment for MRSA infections. Performed at [facility], [address], [place], [place] [address]     Radiology Reports CT Angio  Chest PE W and/or Wo Contrast  Result Date: 09/13/2019 CLINICAL DATA:  Shortness of breath. EXAM: CT ANGIOGRAPHY CHEST WITH CONTRAST TECHNIQUE: Multidetector CT imaging of the chest was performed using the standard protocol during bolus administration of intravenous contrast. Multiplanar CT image reconstructions and MIPs were obtained to evaluate the vascular anatomy. CONTRAST:  OMNIPAQUE IOHEXOL 350 MG/ML SOLN COMPARISON:  September 02, 2019 FINDINGS: Cardiovascular: Satisfactory opacification of the pulmonary arteries to the segmental level. No evidence of pulmonary embolism. Normal heart size. No pericardial effusion. Mediastinum/Nodes: No enlarged mediastinal, hilar, or axillary lymph nodes. Thyroid gland, trachea, and esophagus demonstrate no significant findings. Lungs/Pleura: Marked severity patchy infiltrates are seen throughout both lungs. This is markedly increased in severity when compared to the prior study. There is no evidence of a pleural effusion or pneumothorax. Upper Abdomen: A stable 3.6 cm x 2.2 cm cystic appearing area is seen within the posteromedial aspect of the right lobe of the liver. Musculoskeletal: No chest wall abnormality. No acute or significant osseous findings. Review of the MIP images confirms the above findings. IMPRESSION: 1. No evidence of pulmonary embolism. 2. Marked severity patchy bilateral infiltrates, markedly increased in severity when compared to the prior study. Electronically Signed   By: Aram Candela M.D.   On: 09/13/2019 18:13   CT Angio Chest PE W and/or Wo Contrast  Result Date: 09/02/2019 CLINICAL DATA:  Shortness of breath. EXAM: CT ANGIOGRAPHY CHEST WITH CONTRAST TECHNIQUE: Multidetector CT imaging of the chest was performed using the standard protocol during bolus administration of intravenous contrast. Multiplanar  CT image reconstructions and MIPs were obtained to evaluate the vascular anatomy. CONTRAST:  121mL OMNIPAQUE IOHEXOL 350 MG/ML SOLN  COMPARISON:  None. FINDINGS: Cardiovascular: Satisfactory opacification of the pulmonary arteries to the segmental level. No evidence of pulmonary embolism. Normal heart size. No pericardial effusion. Mediastinum/Nodes: No enlarged mediastinal, hilar, or axillary lymph nodes. Thyroid gland, trachea, and esophagus demonstrate no significant findings. Lungs/Pleura: Moderate severity diffuse areas of atelectasis and/or infiltrate are seen bilaterally, slightly more prominent within the bilateral lung bases. There is no evidence of a pleural effusion or pneumothorax. Upper Abdomen: A 3.7 cm x 2.1 cm cystic appearing areas seen within the posteromedial aspect of the right lobe of the liver. Musculoskeletal: Multilevel degenerative changes seen throughout the thoracic spine. Review of the MIP images confirms the above findings. IMPRESSION: 1. No evidence of acute pulmonary embolism. 2. Moderate severity diffuse areas of atelectasis and/or infiltrate bilaterally, slightly more prominent within the bilateral lung bases. 3. 3.7 cm cystic appearing lesion in the right lobe of the liver. Electronically Signed   By: Virgina Norfolk M.D.   On: 09/02/2019 21:28   DG Chest Port 1 View  Result Date: 09/13/2019 CLINICAL DATA:  Shortness of breath, COVID-19 positivity EXAM: PORTABLE CHEST 1 VIEW COMPARISON:  08/19/2019 FINDINGS: Cardiac shadow is stable. Patchy opacities are again seen throughout both lungs slightly increased from the prior exam consistent with the given clinical history of COVID-19 positivity. No sizable effusion is noted. No bony abnormality is seen. IMPRESSION: Slight increase in patchy opacities consistent with the given clinical history of COVID-19 positivity. Electronically Signed   By: Inez Catalina M.D.   On: 09/13/2019 16:18   DG Chest Port 1 View  Result Date: 09/08/2019 CLINICAL DATA:  COVID-19 positive.  Shortness of breath. EXAM: PORTABLE CHEST 1 VIEW COMPARISON:  September 06, 2019 FINDINGS:  Bilateral pulmonary infiltrates are stable on the left and in the right base in the interval. The heart, hila, mediastinum, lungs, and pleura are otherwise unchanged. IMPRESSION: Stable bilateral pulmonary infiltrates. Electronically Signed   By: Dorise Bullion III M.D   On: 09/08/2019 10:23   DG Chest Port 1 View  Result Date: 09/06/2019 CLINICAL DATA:  Shortness of breath EXAM: PORTABLE CHEST 1 VIEW COMPARISON:  09/02/2019 plain film and CT FINDINGS: Midline trachea. Mild cardiomegaly. Given differences in technique, similar left greater than right peripheral and basilar predominant interstitial opacities. Mild right hemidiaphragm elevation. No pleural effusion or pneumothorax. IMPRESSION: No significant change in basilar and peripheral predominant interstitial opacities, most consistent with COVID-19 pneumonia. Electronically Signed   By: Abigail Miyamoto M.D.   On: 09/06/2019 08:47   DG Chest Port 1 View  Result Date: 09/02/2019 CLINICAL DATA:  Short of breath. EXAM: PORTABLE CHEST 1 VIEW COMPARISON:  None FINDINGS: Cardiac enlargement. Decreased lung volumes. Bilateral peripheral and lower lobe predominant opacities are identified. No pleural effusion or edema. IMPRESSION: Bilateral peripheral and lower lobe predominant opacities, which in the acute setting are concerning for multifocal pneumonia. Electronically Signed   By: Kerby Moors M.D.   On: 09/02/2019 19:51   VAS [place] LOWER EXTREMITY VENOUS (DVT)  Result Date: 09/14/2019  Lower Venous DVTStudy Indications: Covid +, elevated d-dimer=6.71.  Comparison Study: No prior study Performing Technologist: Maudry Mayhew MHA, RDMS, RVT, RDCS  Examination Guidelines: A complete evaluation includes B-mode imaging, spectral Doppler, color Doppler, and power Doppler as needed of all accessible portions of each vessel. Bilateral testing is considered an integral part of a complete examination. Limited examinations for  reoccurring indications may be  performed as noted. The reflux portion of the exam is performed with the patient in reverse Trendelenburg.  +---------+---------------+---------+-----------+----------+--------------+ RIGHT    CompressibilityPhasicitySpontaneityPropertiesThrombus Aging +---------+---------------+---------+-----------+----------+--------------+ CFV      Full           Yes      Yes                                 +---------+---------------+---------+-----------+----------+--------------+ SFJ      Full                                                        +---------+---------------+---------+-----------+----------+--------------+ FV Prox  Full                                                        +---------+---------------+---------+-----------+----------+--------------+ FV Mid   Full                                                        +---------+---------------+---------+-----------+----------+--------------+ FV DistalFull                                                        +---------+---------------+---------+-----------+----------+--------------+ PFV      Full                                                        +---------+---------------+---------+-----------+----------+--------------+ POP      Full           Yes      Yes                                 +---------+---------------+---------+-----------+----------+--------------+ PTV      None                    No                   Acute          +---------+---------------+---------+-----------+----------+--------------+ PERO     None                    No                   Acute          +---------+---------------+---------+-----------+----------+--------------+   +---------+---------------+---------+-----------+----------+--------------+ LEFT     CompressibilityPhasicitySpontaneityPropertiesThrombus Aging +---------+---------------+---------+-----------+----------+--------------+ CFV      Full            Yes      Yes                                 +---------+---------------+---------+-----------+----------+--------------+  SFJ      Full                                                        +---------+---------------+---------+-----------+----------+--------------+ FV Prox  Full                                                        +---------+---------------+---------+-----------+----------+--------------+ FV Mid   Full                                                        +---------+---------------+---------+-----------+----------+--------------+ FV DistalFull                                                        +---------+---------------+---------+-----------+----------+--------------+ PFV      Full                                                        +---------+---------------+---------+-----------+----------+--------------+ POP      Full           Yes      Yes                                 +---------+---------------+---------+-----------+----------+--------------+ PTV      None                    No                   Acute          +---------+---------------+---------+-----------+----------+--------------+ Gastroc  None                    No                   Acute          +---------+---------------+---------+-----------+----------+--------------+   Left Technical Findings: Not visualized segments include peroneal veins not adequately visualized.   Summary: RIGHT: - Findings consistent with acute deep vein thrombosis involving the right posterior tibial veins, and right peroneal veins. - No cystic structure found in the popliteal fossa.  LEFT: - Findings consistent with acute deep vein thrombosis involving the left posterior tibial veins, and left gastrocnemius veins. - No cystic structure found in the popliteal fossa.  *See table(s) above for measurements and observations.    Preliminary    ECHOCARDIOGRAM LIMITED  Result Date:  09/14/2019    ECHOCARDIOGRAM LIMITED REPORT   Patient Name:   Kristen Bender Date of Exam: 09/14/2019 Medical Rec #:  [id number]        Height: Accession #:  [id number]       Weight: Date of Birth:  07/24/1955         BSA: Patient Age:    64 years         BP:           121/72 mmHg Patient Gender: F                HR:           84 bpm. Exam Location:  Inpatient Procedure: 2D Echo Indications:    EKG abnormalities  History:        Patient has no prior history of Echocardiogram examinations. No                 prior cardiac hx on file.  Sonographer:    Celene SkeenVijay Shankar RDCS (AE) Referring Phys: John Giovanniathore, Vasundhra IMPRESSIONS  1. Left ventricular ejection fraction, by estimation, is 60 to 65%. The left ventricle has normal function.  2. Right ventricular systolic function is normal. The right ventricular size is normal.  3. The mitral valve is normal in structure. No evidence of mitral valve regurgitation. No evidence of mitral stenosis.  4. The aortic valve is normal in structure. Aortic valve regurgitation is not visualized. No aortic stenosis is present. FINDINGS  Left Ventricle: Left ventricular ejection fraction, by estimation, is 60 to 65%. The left ventricle has normal function. The left ventricular internal cavity size was normal in size. There is no left ventricular hypertrophy. Right Ventricle: The right ventricular size is normal. No increase in right ventricular wall thickness. Right ventricular systolic function is normal. Left Atrium: Left atrial size was normal in size. Right Atrium: Right atrial size was normal in size. Pericardium: Trivial pericardial effusion is present. Mitral Valve: The mitral valve is normal in structure. No evidence of mitral valve stenosis. Tricuspid Valve: The tricuspid valve is grossly normal. Tricuspid valve regurgitation is not demonstrated. Aortic Valve: The aortic valve is normal in structure. Aortic valve regurgitation is not visualized. No aortic stenosis is present. Pulmonic  Valve: The pulmonic valve was normal in structure. Pulmonic valve regurgitation is not visualized. Aorta: The aortic root and ascending aorta are structurally normal, with no evidence of dilitation. IAS/Shunts: The atrial septum is grossly normal. Kristeen MissPhilip Nahser MD Electronically signed by Kristeen MissPhilip Nahser MD Signature Date/Time: 09/14/2019/4:10:14 PM    Final    US EKG SITE RITE  Result Date: 09/14/2019 If Site Rite image not attached, placement could not be confirmed due to current cardiac rhythm.   Lab Data:  CBC: Recent Labs  Lab 09/09/19 0916 09/10/19 0232 09/13/19 1533 09/14/19 0400 09/15/19 0700  WBC 12.0* 11.9* 13.5* 13.5* 15.1*  NEUTROABS 10.3* 8.8* 12.6* 12.1*  --   HGB 15.1* 13.2 13.7 13.4 13.8  HCT 46.2* 40.6 42.0 42.5 41.7  MCV 97.9 97.1 100.0 100.2* 100.5*  PLT 374 312 250 244 242   Basic Metabolic Panel: Recent Labs  Lab 09/09/19 0713 09/10/19 0232 09/13/19 1533 09/14/19 0400 09/15/19 0700  NA 135 135 138  --  136  K 4.8 3.7 3.9  --  4.5  CL 97* 99 104  --  102  CO2 24 25 24   --  25  GLUCOSE 116* 105* 144*  --  106*  BUN 25* 25* 14  --  23  CREATININE 0.77 0.80 0.69 0.65 0.67  CALCIUM 9.2 9.2 9.1  --  8.3*  MG 2.1 2.1  --   --   --    GFR:  Estimated Creatinine Clearance: 81.2 mL/min (by C-G formula based on SCr of 0.67 mg/dL). Liver Function Tests: Recent Labs  Lab 09/09/19 0713 09/10/19 0232 09/13/19 1533  AST 35 25 21  ALT 37 35 30  ALKPHOS 57 49 60  BILITOT 2.3* 1.3* 1.5*  PROT 6.7 6.4* 6.9  ALBUMIN 3.3* 3.1* 3.7   No results for input(s): LIPASE, AMYLASE in the last 168 hours. No results for input(s): AMMONIA in the last 168 hours. Coagulation Profile: Recent Labs  Lab 09/14/19 1404  INR 1.1   Cardiac Enzymes: Recent Labs  Lab 09/14/19 1445  CKTOTAL 129  CKMB 3.9   BNP (last 3 results) No results for input(s): PROBNP in the last 8760 hours. HbA1C: No results for input(s): HGBA1C in the last 72 hours. CBG: Recent Labs  Lab  09/14/19 1707 09/14/19 2015 09/15/19 0019 09/15/19 0337 09/15/19 0737  GLUCAP 128* 244* 137* 125* 109*   Lipid Profile: Recent Labs    09/13/19 1533  TRIG 104   Thyroid Function Tests: Recent Labs    09/14/19 0400  TSH 0.430   Anemia Panel: Recent Labs    09/13/19 1533 09/15/19 0700  FERRITIN 153 164   Urine analysis: No results found for: COLORURINE, APPEARANCEUR, LABSPEC, PHURINE, GLUCOSEU, HGBUR, BILIRUBINUR, KETONESUR, PROTEINUR, UROBILINOGEN, NITRITE, LEUKOCYTESUR   Kristen Bender M.D. [facility] 09/15/2019, 10:57 AM   Call night coverage person covering after 7pm

## 2019-09-16 DIAGNOSIS — R7989 Other specified abnormal findings of blood chemistry: Secondary | ICD-10-CM

## 2019-09-16 LAB — BASIC METABOLIC PANEL
Anion gap: 10 (ref 5–15)
BUN: 27 mg/dL — ABNORMAL HIGH (ref 8–23)
CO2: 26 mmol/L (ref 22–32)
Calcium: 9.2 mg/dL (ref 8.9–10.3)
Chloride: 98 mmol/L (ref 98–111)
Creatinine, Ser: 0.76 mg/dL (ref 0.44–1.00)
GFR calc Af Amer: >60 mL/min (ref >60)
GFR calc non Af Amer: >60 mL/min (ref >60)
Glucose, Bld: 133 mg/dL — ABNORMAL HIGH (ref 70–99)
Potassium: 4.1 mmol/L (ref 3.5–5.1)
Sodium: 134 mmol/L — ABNORMAL LOW (ref 135–145)

## 2019-09-16 LAB — CBC
HCT: 41.6 % (ref 36.0–46.0)
Hemoglobin: 13.6 g/dL (ref 12.0–15.0)
MCH: 32.1 pg (ref 26.0–34.0)
MCHC: 32.7 g/dL (ref 30.0–36.0)
MCV: 98.1 fL (ref 80.0–100.0)
Platelets: 235 10*3/uL (ref 150–400)
RBC: 4.24 MIL/uL (ref 3.87–5.11)
RDW: 13.3 % (ref 11.5–15.5)
WBC: 10.1 10*3/uL (ref 4.0–10.5)
nRBC: 0 % (ref 0.0–0.2)

## 2019-09-16 LAB — GLOMERULAR BASEMENT MEMBRANE ANTIBODIES

## 2019-09-16 LAB — RHEUMATOID FACTOR

## 2019-09-16 LAB — ANTI-SCLERODERMA ANTIBODY

## 2019-09-16 LAB — C-REACTIVE PROTEIN: CRP: 0.5 mg/dL (ref <1.0)

## 2019-09-16 LAB — HEPARIN LEVEL (UNFRACTIONATED)
Heparin Unfractionated: 0.27 IU/mL — ABNORMAL LOW (ref 0.30–0.70)
Heparin Unfractionated: 0.26 IU/mL — ABNORMAL LOW (ref 0.30–0.70)
Heparin Unfractionated: 0.49 IU/mL (ref 0.30–0.70)

## 2019-09-16 LAB — LEGIONELLA PNEUMOPHILA SEROGP 1 UR AG
L. pneumophila Serogp 1 Ur Ag: NEGATIVE
L. pneumophila Serogp 1 Ur Ag: NEGATIVE

## 2019-09-16 LAB — CYCLIC CITRUL PEPTIDE ANTIBODY, IGG/IGA

## 2019-09-16 LAB — ANTI-DNA ANTIBODY, DOUBLE-STRANDED

## 2019-09-16 LAB — D-DIMER, QUANTITATIVE: D-Dimer, Quant: 2.62 ug/mL-FEU — ABNORMAL HIGH (ref 0.00–0.50)

## 2019-09-16 LAB — ANTINUCLEAR ANTIBODIES, IFA

## 2019-09-16 LAB — FERRITIN: Ferritin: 184 ng/mL (ref 11–307)

## 2019-09-16 MED ORDER — HEPARIN BOLUS VIA INFUSION
1200.0000 [IU] | Freq: Once | INTRAVENOUS | Status: AC
  Administered 2019-09-16: 1200 [IU] via INTRAVENOUS
  Filled 2019-09-16: qty 1200

## 2019-09-16 MED ORDER — ADULT MULTIVITAMIN W/MINERALS CH
1.0000 | ORAL_TABLET | Freq: Every day | ORAL | Status: DC
  Administered 2019-09-16 – 2019-09-24 (×9): 1 via ORAL
  Filled 2019-09-16 (×9): qty 1

## 2019-09-16 MED ORDER — ENSURE ENLIVE PO LIQD
237.0000 mL | ORAL | Status: DC
  Administered 2019-09-17 – 2019-09-24 (×8): 237 mL via ORAL

## 2019-09-16 MED ORDER — PRO-STAT SUGAR FREE PO LIQD
30.0000 mL | Freq: Two times a day (BID) | ORAL | Status: DC
  Administered 2019-09-16 – 2019-09-24 (×17): 30 mL via ORAL
  Filled 2019-09-16 (×17): qty 30

## 2019-09-16 MED ORDER — HEPARIN (PORCINE) 25000 UT/250ML-% IV SOLN
900.0000 [IU]/h | INTRAVENOUS | Status: DC
  Administered 2019-09-16: 750 [IU]/h via INTRAVENOUS
  Administered 2019-09-16 – 2019-09-18 (×2): 900 [IU]/h via INTRAVENOUS
  Filled 2019-09-16 (×2): qty 250

## 2019-09-16 NOTE — Progress Notes (Signed)
ANTICOAGULATION CONSULT NOTE  Pharmacy Consult for heparin Indication: DVT  No Known Allergies  Patient Measurements: Height: 5\' 4"  (162.6 cm) Weight: 218 lb 4.1 oz (99 kg) IBW/kg (Calculated) : 54.7 Heparin Dosing Weight: 78 kg  Vital Signs: Temp: 97.9 F (36.6 C) (03/29 2200) Temp Source: Oral (03/29 2200) BP: 115/87 (03/29 2200) Pulse Rate: 74 (03/29 2200)  Labs: Recent Labs     0000 09/14/19 0400 09/14/19 1404 09/14/19 1445 09/14/19 1447 09/14/19 2030 09/15/19 0700 09/15/19 1630 09/16/19 0424 09/16/19 1330 09/16/19 2218  HGB   < > 13.4  --   --   --   --  13.8  --  13.6  --   --   HCT  --  42.5  --   --   --   --  41.7  --  41.6  --   --   PLT  --  244  --   --   --   --  242  --  235  --   --   APTT  --   --  47*  --   --   --   --   --   --   --   --   LABPROT  --   --  14.2  --   --   --   --   --   --   --   --   INR  --   --  1.1  --   --   --   --   --   --   --   --   HEPARINUNFRC  --   --   --   --   --    < > 0.81*   < > 0.27* 0.26* 0.49  CREATININE  --  0.65  --   --   --   --  0.67  --  0.76  --   --   CKTOTAL  --   --   --  129  --   --   --   --   --   --   --   CKMB  --   --   --  3.9  --   --   --   --   --   --   --   TROPONINIHS  --   --   --   --  21*  --   --   --   --   --   --    < > = values in this interval not displayed.    Estimated Creatinine Clearance: 81.2 mL/min (by C-G formula based on SCr of 0.76 mg/dL).   Medical History: Past Medical History:  Diagnosis Date  . Anemia   . Anxiety   . GERD (gastroesophageal reflux disease)   . IBS (irritable bowel syndrome)   . Thyroid disease     Medications: Pt was not taking anticoagulants PTA -Enoxaparin 40 mg subQ dose given 3/27 @ 0950  Assessment: Pharmacy consulted to dose/monitor heparin in this 64 year old female recently hospitalized with COVID PNA. Pt presented to ED with complaints of SOB, venous doppler on 3/27 revealed bilateral acute DVT. CTA 3/26: No evidence of PE.  Pt not taking anticoagulants PTA.  Today, 09/16/19 10:53 PM   2218 HL = 0.49 at goal, no bleeding or infusion issues reported by RN.   Goal of Therapy:  Heparin level 0.3-0.7 units/ml Monitor platelets by anticoagulation  protocol: Yes   Plan:   Continue heparin drip at 900 units/hr  CBC, HL daily while on heparin infusion  Monitor for signs and symptoms of bleeding  Follow along for eventual transition to PO anticoagulation  Dorrene German, PharmD 09/16/2019,10:53 PM

## 2019-09-16 NOTE — IPOC Note (Signed)
Heparin level returned 09/16/2019 @2340  is 0.49 which is therapeutic- no change in drip rate of 9- redrawn in am

## 2019-09-16 NOTE — Progress Notes (Signed)
ANTICOAGULATION CONSULT NOTE  Pharmacy Consult for heparin Indication: DVT  No Known Allergies  Patient Measurements: Height: 5\' 4"  (162.6 cm) Weight: 218 lb 4.1 oz (99 kg) IBW/kg (Calculated) : 54.7 Heparin Dosing Weight: 78 kg  Vital Signs: Temp: 98.5 F (36.9 C) (03/29 1242) Temp Source: Oral (03/29 1242) BP: 108/77 (03/29 1242) Pulse Rate: 85 (03/29 1242)  Labs: Recent Labs     0000 09/13/19 1533 09/13/19 1533 09/13/19 1800 09/14/19 0400 09/14/19 1404 09/14/19 1445 09/14/19 1447 09/14/19 2030 09/15/19 0700 09/15/19 0700 09/15/19 1630 09/16/19 0424 09/16/19 1330  HGB   < > 13.7   < >  --  13.4  --   --   --   --  13.8  --   --  13.6  --   HCT  --  42.0   < >  --  42.5  --   --   --   --  41.7  --   --  41.6  --   PLT  --  250   < >  --  244  --   --   --   --  242  --   --  235  --   APTT  --   --   --   --   --  47*  --   --   --   --   --   --   --   --   LABPROT  --   --   --   --   --  14.2  --   --   --   --   --   --   --   --   INR  --   --   --   --   --  1.1  --   --   --   --   --   --   --   --   HEPARINUNFRC  --   --   --   --   --   --   --   --    < > 0.81*   < > 0.36 0.27* 0.26*  CREATININE  --  0.69   < >  --  0.65  --   --   --   --  0.67  --   --  0.76  --   CKTOTAL  --   --   --   --   --   --  129  --   --   --   --   --   --   --   CKMB  --   --   --   --   --   --  3.9  --   --   --   --   --   --   --   TROPONINIHS  --  18*  --  17  --   --   --  21*  --   --   --   --   --   --    < > = values in this interval not displayed.    Estimated Creatinine Clearance: 81.2 mL/min (by C-G formula based on SCr of 0.76 mg/dL).   Medical History: Past Medical History:  Diagnosis Date  . Anemia   . Anxiety   . GERD (gastroesophageal reflux disease)   . IBS (irritable bowel syndrome)   . Thyroid disease  Medications: Pt was not taking anticoagulants PTA -Enoxaparin 40 mg subQ dose given 3/27 @ 0950  Assessment: Pharmacy consulted to  dose/monitor heparin in this 64 year old female recently hospitalized with COVID PNA. Pt presented to ED with complaints of SOB, venous doppler on 3/27 revealed bilateral acute DVT. CTA 3/26: No evidence of PE. Pt not taking anticoagulants PTA.  Today, 09/16/19 2:40 PM   Heparin level actually lower after rate increase this AM  CBC stable  Per RN, no reported bleeding and no issues with IV site/line, infusion not paused  Goal of Therapy:  Heparin level 0.3-0.7 units/ml Monitor platelets by anticoagulation protocol: Yes   Plan:   Bolus heparin 1200 units IV  Increase IV heparin from 750 units/hr to 900 units/hr  Recheck heparin level 6 hours after rate change  CBC, HL daily while on heparin infusion  Monitor for signs and symptoms of bleeding  Follow along for eventual transition to PO anticoagulation  Valentina Gu, PharmD 09/16/2019,2:38 PM

## 2019-09-16 NOTE — Progress Notes (Signed)
[facility]                                                                              Patient Demographics  Kristen Bender, is a 64 y.o. female, DOB - Nov 13, 1955, WLS:[id number]  Admit date - 09/13/2019   Admitting Physician John Giovanni, MD  Outpatient Primary MD for the patient is Bing Neighbors, FNP  Outpatient specialists:   LOS - 3  days   Medical records reviewed and are as summarized below:    Chief Complaint  Patient presents with  . COVID POSITIVE  . Shortness of Breath       Brief summary   Patient is a 64 year old female with history of anemia, anxiety, GERD, IBS, thyroid disease, recent hospitalization 3/15 to 09/10/2019 for acute hypoxic respiratory failure secondary to acute COVID-19 viral pneumonitis (patient was treated with IV steroids, remdesivir and Actemra, discharged on 2 L O2) Presented to ED with shortness of breath and wheezing, received epinephrine and Solu-Medrol via EMS.  Reported worsening shortness of breath since her recent hospital discharge.  Also reported coughing, O2 sats were in the 80s on her pulse ox despite 5 L O2 at home. CT angiogram of the chest negative for PE however showed marked severity patchy bilateral infiltrates, markedly increased in severity. COVID-19 positive   Assessment & Plan    Principal Problem:   Acute hypoxic respiratory failure due to acute COVID-19 viral pneumonia during the ongoing COVID-19 pandemic- POA - Patient presented with worsening shortness of breath, hypoxia, CT angiogram of the chest showed no PE but markedly worsened bilateral infiltrates -Hypoxia improving, on 10 L O2 HFNC  -Patient has recently completed IV remdesivir during previous admission.  CCM following. - Continue albuterol, Dulera, flutter valve.  Antibiotics discontinued on 3/27 - Restarted steroids 40 mg IV every 12 hours on 3/28 - Continue Supportive care: vitamin C/zinc, albuterol, Tylenol. -Pulmonology  following, autoimmune and vasculitis panel in process - strict I/O's, daily weights, keep dry, negative balance of 1.4 L.  Continue IV Lasix 20 mg daily - Continue to wean oxygen, ambulatory O2 screening daily as tolerated  - Oxygen - SpO2: 96 % O2 Flow Rate (L/min): 10 L/min FiO2 (%): 100 % - Continue to follow labs as below  Lab Results  Component Value Date   SARSCOV2NAA POSITIVE (A) 09/13/2019     Recent Labs  Lab 09/10/19 0232 09/13/19 1533 09/15/19 0700 09/16/19 0424  DDIMER 1.18* 6.71* 3.23* 2.62*  FERRITIN  --  153 164 184  CRP 0.6 0.5 <0.5 0.5  ALT 35 30  --   --   PROCALCITON  --  <0.10  --   --     Active Problems:   Leg DVT (deep venous thromboembolism), acute, bilateral (HCC) - Likely due to COVID-19 hypercoagulable state - D-dimer elevated 6.71, CT angiogram chest negative for PE - Venous Dopplers lower extremities positive for DVT in bilateral legs -Continue IV heparin drip.  Case management consult for co-pays for NOACs    Anxiety  -Continue sertraline, Klonopinn1mg  BID   Demand ischemia, mildly elevated troponin - EKG showed T wave inversions, slight ST depressions in  anterolateral leads, likely due to #1 - Currently no chest pain.  2D echo 3/27 showed EF of 60 to 65%, normal left ventricular and right ventricular function  Obesity Estimated body mass index is 37.46 kg/m as calculated from the following:   Height as of this encounter: 5\' 4"  (1.626 m).   Weight as of this encounter: 99 kg.  Code Status: Full CODE STATUS DVT Prophylaxis: Placed on IV heparin drip for acute bilateral DVT Family Communication: Discussed all imaging results, lab results, explained to the patient and daughter on the phone   Disposition Plan: Patient from home, anticipate discharge home once has recovered from the hypoxic respiratory failure, COVID-19 viral pneumonitis   Time Spent in minutes 25 minutes  Procedures:  CT angiogram of the chest  Consultants:     Pulmonary critical care  Antimicrobials:   Anti-infectives (From admission, onward)   Start     Dose/Rate Route Frequency Ordered Stop   09/14/19 2200  vancomycin (VANCOREADY) IVPB 1250 mg/250 mL  Status:  Discontinued     1,250 mg 166.7 mL/hr over 90 Minutes Intravenous Every 24 hours 09/13/19 2224 09/14/19 1550   09/13/19 2230  vancomycin (VANCOREADY) IVPB 2000 mg/400 mL     2,000 mg 200 mL/hr over 120 Minutes Intravenous  Once 09/13/19 2224 09/14/19 0155   09/13/19 2230  ceFEPIme (MAXIPIME) 2 g in sodium chloride 0.9 % 100 mL IVPB  Status:  Discontinued     2 g 200 mL/hr over 30 Minutes Intravenous Every 8 hours 09/13/19 2224 09/14/19 1550         Medications  Scheduled Meds: . vitamin C  500 mg Oral Daily  . chlorhexidine  15 mL Mouth Rinse BID  . Chlorhexidine Gluconate Cloth  6 each Topical Daily  . clonazePAM  1 mg Oral BID  . feeding supplement (ENSURE ENLIVE)  237 mL Oral Q24H  . feeding supplement (PRO-STAT SUGAR FREE 64)  30 mL Oral BID  . furosemide  20 mg Intravenous Daily  . guaiFENesin  1,200 mg Oral BID  . loratadine  10 mg Oral Daily  . mouth rinse  15 mL Mouth Rinse q12n4p  . methylPREDNISolone (SOLU-MEDROL) injection  40 mg Intravenous Q12H  . mometasone-formoterol  2 puff Inhalation BID  . multivitamin with minerals  1 tablet Oral Daily  . sertraline  100 mg Oral Daily  . sodium chloride flush  10-40 mL Intracatheter Q12H  . zinc sulfate  220 mg Oral Daily   Continuous Infusions: . heparin 750 Units/hr (09/16/19 0551)   PRN Meds:.acetaminophen, albuterol, sodium chloride flush      Subjective:   Kristen Bender was seen and examined today. Hypoxia improving, starting to feel better. Patient denies dizziness, chest pain, abdominal pain, N/V/D/C, new weakness, numbess, tingling  Objective:   Vitals:   09/15/19 2055 09/16/19 0427 09/16/19 0825 09/16/19 1242  BP: 119/78 120/76  108/77  Pulse: 89 74 89 85  Resp: 18 18  19   Temp: 99.1 F  (37.3 C) 98.3 F (36.8 C)  98.5 F (36.9 C)  TempSrc: Oral Oral  Oral  SpO2: 94% 97% (!) 89% 96%  Weight:      Height:        Intake/Output Summary (Last 24 hours) at 09/16/2019 1253 Last data filed at 09/16/2019 1100 Gross per 24 hour  Intake 542.37 ml  Output 1350 ml  Net -807.63 ml     Wt Readings from Last 3 Encounters:  09/13/19 99 kg  09/05/19 99.1  kg  03/17/16 75.3 kg   Physical Exam  General: Alert and oriented x 3, NAD  Cardiovascular: S1 S2 clear, RRR. No pedal edema b/l  Respiratory: diminished BS   Gastrointestinal: Soft, nontender, nondistended, NBS  Ext: no pedal edema bilaterally  Neuro: no new deficits  Musculoskeletal: No cyanosis, clubbing  Skin: No rashes  Psych: Normal affect and demeanor, alert and oriented x3     Data Reviewed:  I have personally reviewed following labs and imaging studies  Micro Results Recent Results (from the past 240 hour(s))  Blood Culture (routine x 2)     Status: None (Preliminary result)   Collection Time: 09/13/19  3:33 PM   Specimen: BLOOD  Result Value Ref Range Status   Specimen Description BLOOD RIGHT ANTECUBITAL  Final   Special Requests   Final    BOTTLES DRAWN AEROBIC AND ANAEROBIC Blood Culture adequate volume Performed at [facility], [address], [place], [place] [address]    Culture NO GROWTH 3 DAYS  Final   Report Status PENDING  Incomplete  Respiratory Panel by PCR     Status: None   Collection Time: 09/13/19 10:38 PM   Specimen: Nasopharyngeal Swab; Respiratory  Result Value Ref Range Status   Adenovirus NOT DETECTED NOT DETECTED Final   Coronavirus 229E NOT DETECTED NOT DETECTED Final    Comment: (NOTE) The Coronavirus on the Respiratory Panel, DOES NOT test for the novel  Coronavirus (2019 nCoV)    Coronavirus HKU1 NOT DETECTED NOT DETECTED Final   Coronavirus NL63 NOT DETECTED NOT DETECTED Final   Coronavirus OC43 NOT DETECTED NOT DETECTED Final    Metapneumovirus NOT DETECTED NOT DETECTED Final   Rhinovirus / Enterovirus NOT DETECTED NOT DETECTED Final   Influenza A NOT DETECTED NOT DETECTED Final   Influenza B NOT DETECTED NOT DETECTED Final   Parainfluenza Virus 1 NOT DETECTED NOT DETECTED Final   Parainfluenza Virus 2 NOT DETECTED NOT DETECTED Final   Parainfluenza Virus 3 NOT DETECTED NOT DETECTED Final   Parainfluenza Virus 4 NOT DETECTED NOT DETECTED Final   Respiratory Syncytial Virus NOT DETECTED NOT DETECTED Final   Bordetella pertussis NOT DETECTED NOT DETECTED Final   Chlamydophila pneumoniae NOT DETECTED NOT DETECTED Final   Mycoplasma pneumoniae NOT DETECTED NOT DETECTED Final    Comment: Performed at [facility], [address], [place], [place] [address]  Respiratory Panel by RT PCR (Flu A&B, Covid) - Nasopharyngeal Swab     Status: Abnormal   Collection Time: 09/13/19 11:30 PM   Specimen: Nasopharyngeal Swab  Result Value Ref Range Status   SARS Coronavirus 2 by RT PCR POSITIVE (A) NEGATIVE Final    Comment: RESULT CALLED TO, READ BACK BY AND VERIFIED WITH: T,COLEY AT 0234 ON 09/14/19 BY A,MOHAMED (NOTE) SARS-CoV-2 target nucleic acids are DETECTED. SARS-CoV-2 RNA is generally detectable in upper respiratory specimens  during the acute phase of infection. Positive results are indicative of the presence of the identified virus, but do not rule out bacterial infection or co-infection with other pathogens not detected by the test. Clinical correlation with patient history and other diagnostic information is necessary to determine patient infection status. The expected result is Negative. Fact Sheet for Patients:  https://www.moore.com/ Fact Sheet for Healthcare Providers: https://www.young.biz/ This test is not yet approved or cleared by the [place] FDA and  has been authorized for detection and/or diagnosis of SARS-CoV-2 by FDA under an Emergency Use  Authorization (EUA).  This EUA will  remain in effect (meaning this test can be use d) for the duration of  the COVID-19 declaration under Section 564(b)(1) of the Act, 21 U.S.C. section 360bbb-3(b)(1), unless the authorization is terminated or revoked sooner.    Influenza A by PCR NEGATIVE NEGATIVE Final   Influenza B by PCR NEGATIVE NEGATIVE Final    Comment: (NOTE) The Xpert Xpress SARS-CoV-2/FLU/RSV assay is intended as an aid in  the diagnosis of influenza from Nasopharyngeal swab specimens and  should not be used as a sole basis for treatment. Nasal washings and  aspirates are unacceptable for Xpert Xpress SARS-CoV-2/FLU/RSV  testing. Fact Sheet for Patients: https://www.moore.com/ Fact Sheet for Healthcare Providers: https://www.young.biz/ This test is not yet approved or cleared by the [place] FDA and  has been authorized for detection and/or diagnosis of SARS-CoV-2 by  FDA under an Emergency Use Authorization (EUA). This EUA will remain  in effect (meaning this test can be used) for the duration of the  Covid-19 declaration under Section 564(b)(1) of the Act, 21  U.S.C. section 360bbb-3(b)(1), unless the authorization is  terminated or revoked. Performed at [facility], [address] [place], [place] [address]   MRSA PCR Screening     Status: None   Collection Time: 09/14/19  1:34 PM  Result Value Ref Range Status   MRSA by PCR NEGATIVE NEGATIVE Final    Comment:        The GeneXpert MRSA Assay (FDA approved for NASAL specimens only), is one component of a comprehensive MRSA colonization surveillance program. It is not intended to diagnose MRSA infection nor to guide or monitor treatment for MRSA infections. Performed at [facility], [address], [place], [place] [address]     Radiology Reports CT Angio Chest PE W and/or Wo Contrast  Result Date: 09/13/2019 CLINICAL DATA:   Shortness of breath. EXAM: CT ANGIOGRAPHY CHEST WITH CONTRAST TECHNIQUE: Multidetector CT imaging of the chest was performed using the standard protocol during bolus administration of intravenous contrast. Multiplanar CT image reconstructions and MIPs were obtained to evaluate the vascular anatomy. CONTRAST:  OMNIPAQUE IOHEXOL 350 MG/ML SOLN COMPARISON:  September 02, 2019 FINDINGS: Cardiovascular: Satisfactory opacification of the pulmonary arteries to the segmental level. No evidence of pulmonary embolism. Normal heart size. No pericardial effusion. Mediastinum/Nodes: No enlarged mediastinal, hilar, or axillary lymph nodes. Thyroid gland, trachea, and esophagus demonstrate no significant findings. Lungs/Pleura: Marked severity patchy infiltrates are seen throughout both lungs. This is markedly increased in severity when compared to the prior study. There is no evidence of a pleural effusion or pneumothorax. Upper Abdomen: A stable 3.6 cm x 2.2 cm cystic appearing area is seen within the posteromedial aspect of the right lobe of the liver. Musculoskeletal: No chest wall abnormality. No acute or significant osseous findings. Review of the MIP images confirms the above findings. IMPRESSION: 1. No evidence of pulmonary embolism. 2. Marked severity patchy bilateral infiltrates, markedly increased in severity when compared to the prior study. Electronically Signed   By: Aram Candela M.D.   On: 09/13/2019 18:13   CT Angio Chest PE W and/or Wo Contrast  Result Date: 09/02/2019 CLINICAL DATA:  Shortness of breath. EXAM: CT ANGIOGRAPHY CHEST WITH CONTRAST TECHNIQUE: Multidetector CT imaging of the chest was performed using the standard protocol during bolus administration of intravenous contrast. Multiplanar CT image reconstructions and MIPs were obtained to evaluate the vascular anatomy. CONTRAST:  OMNIPAQUE IOHEXOL 350 MG/ML SOLN COMPARISON:  None. FINDINGS: Cardiovascular: Satisfactory opacification  of  the pulmonary arteries to the segmental level. No evidence of pulmonary embolism. Normal heart size. No pericardial effusion. Mediastinum/Nodes: No enlarged mediastinal, hilar, or axillary lymph nodes. Thyroid gland, trachea, and esophagus demonstrate no significant findings. Lungs/Pleura: Moderate severity diffuse areas of atelectasis and/or infiltrate are seen bilaterally, slightly more prominent within the bilateral lung bases. There is no evidence of a pleural effusion or pneumothorax. Upper Abdomen: A 3.7 cm x 2.1 cm cystic appearing areas seen within the posteromedial aspect of the right lobe of the liver. Musculoskeletal: Multilevel degenerative changes seen throughout the thoracic spine. Review of the MIP images confirms the above findings. IMPRESSION: 1. No evidence of acute pulmonary embolism. 2. Moderate severity diffuse areas of atelectasis and/or infiltrate bilaterally, slightly more prominent within the bilateral lung bases. 3. 3.7 cm cystic appearing lesion in the right lobe of the liver. Electronically Signed   By: Aram Candela M.D.   On: 09/02/2019 21:28   DG Chest Port 1 View  Result Date: 09/13/2019 CLINICAL DATA:  Shortness of breath, COVID-19 positivity EXAM: PORTABLE CHEST 1 VIEW COMPARISON:  08/19/2019 FINDINGS: Cardiac shadow is stable. Patchy opacities are again seen throughout both lungs slightly increased from the prior exam consistent with the given clinical history of COVID-19 positivity. No sizable effusion is noted. No bony abnormality is seen. IMPRESSION: Slight increase in patchy opacities consistent with the given clinical history of COVID-19 positivity. Electronically Signed   By: Alcide Clever M.D.   On: 09/13/2019 16:18   DG Chest Port 1 View  Result Date: 09/08/2019 CLINICAL DATA:  COVID-19 positive.  Shortness of breath. EXAM: PORTABLE CHEST 1 VIEW COMPARISON:  September 06, 2019 FINDINGS: Bilateral pulmonary infiltrates are stable on the left and in the right base in  the interval. The heart, hila, mediastinum, lungs, and pleura are otherwise unchanged. IMPRESSION: Stable bilateral pulmonary infiltrates. Electronically Signed   By: Gerome Sam III M.D   On: 09/08/2019 10:23   DG Chest Port 1 View  Result Date: 09/06/2019 CLINICAL DATA:  Shortness of breath EXAM: PORTABLE CHEST 1 VIEW COMPARISON:  09/02/2019 plain film and CT FINDINGS: Midline trachea. Mild cardiomegaly. Given differences in technique, similar left greater than right peripheral and basilar predominant interstitial opacities. Mild right hemidiaphragm elevation. No pleural effusion or pneumothorax. IMPRESSION: No significant change in basilar and peripheral predominant interstitial opacities, most consistent with COVID-19 pneumonia. Electronically Signed   By: Jeronimo Greaves M.D.   On: 09/06/2019 08:47   DG Chest Port 1 View  Result Date: 09/02/2019 CLINICAL DATA:  Short of breath. EXAM: PORTABLE CHEST 1 VIEW COMPARISON:  None FINDINGS: Cardiac enlargement. Decreased lung volumes. Bilateral peripheral and lower lobe predominant opacities are identified. No pleural effusion or edema. IMPRESSION: Bilateral peripheral and lower lobe predominant opacities, which in the acute setting are concerning for multifocal pneumonia. Electronically Signed   By: Signa Kell M.D.   On: 09/02/2019 19:51   VAS [place] LOWER EXTREMITY VENOUS (DVT)  Result Date: 09/14/2019  Lower Venous DVTStudy Indications: Covid +, elevated d-dimer=6.71.  Comparison Study: No prior study Performing Technologist: Gertie Fey MHA, RDMS, RVT, RDCS  Examination Guidelines: A complete evaluation includes B-mode imaging, spectral Doppler, color Doppler, and power Doppler as needed of all accessible portions of each vessel. Bilateral testing is considered an integral part of a complete examination. Limited examinations for reoccurring indications may be performed as noted. The reflux portion of the exam is performed with the patient in  reverse Trendelenburg.  +---------+---------------+---------+-----------+----------+--------------+ RIGHT  CompressibilityPhasicitySpontaneityPropertiesThrombus Aging +---------+---------------+---------+-----------+----------+--------------+ CFV      Full           Yes      Yes                                 +---------+---------------+---------+-----------+----------+--------------+ SFJ      Full                                                        +---------+---------------+---------+-----------+----------+--------------+ FV Prox  Full                                                        +---------+---------------+---------+-----------+----------+--------------+ FV Mid   Full                                                        +---------+---------------+---------+-----------+----------+--------------+ FV DistalFull                                                        +---------+---------------+---------+-----------+----------+--------------+ PFV      Full                                                        +---------+---------------+---------+-----------+----------+--------------+ POP      Full           Yes      Yes                                 +---------+---------------+---------+-----------+----------+--------------+ PTV      None                    No                   Acute          +---------+---------------+---------+-----------+----------+--------------+ PERO     None                    No                   Acute          +---------+---------------+---------+-----------+----------+--------------+   +---------+---------------+---------+-----------+----------+--------------+ LEFT     CompressibilityPhasicitySpontaneityPropertiesThrombus Aging +---------+---------------+---------+-----------+----------+--------------+ CFV      Full           Yes      Yes                                  +---------+---------------+---------+-----------+----------+--------------+  SFJ      Full                                                        +---------+---------------+---------+-----------+----------+--------------+ FV Prox  Full                                                        +---------+---------------+---------+-----------+----------+--------------+ FV Mid   Full                                                        +---------+---------------+---------+-----------+----------+--------------+ FV DistalFull                                                        +---------+---------------+---------+-----------+----------+--------------+ PFV      Full                                                        +---------+---------------+---------+-----------+----------+--------------+ POP      Full           Yes      Yes                                 +---------+---------------+---------+-----------+----------+--------------+ PTV      None                    No                   Acute          +---------+---------------+---------+-----------+----------+--------------+ Gastroc  None                    No                   Acute          +---------+---------------+---------+-----------+----------+--------------+   Left Technical Findings: Not visualized segments include peroneal veins not adequately visualized.   Summary: RIGHT: - Findings consistent with acute deep vein thrombosis involving the right posterior tibial veins, and right peroneal veins. - No cystic structure found in the popliteal fossa.  LEFT: - Findings consistent with acute deep vein thrombosis involving the left posterior tibial veins, and left gastrocnemius veins. - No cystic structure found in the popliteal fossa.  *See table(s) above for measurements and observations.    Preliminary    ECHOCARDIOGRAM LIMITED  Result Date: 09/14/2019    ECHOCARDIOGRAM LIMITED REPORT   Patient Name:    Kristen Bender Date of Exam: 09/14/2019 Medical Rec #:  [id number]        Height: Accession #:  [id number]       Weight: Date of Birth:  02-10-1956         BSA: Patient Age:    64 years         BP:           121/72 mmHg Patient Gender: F                HR:           84 bpm. Exam Location:  Inpatient Procedure: 2D Echo Indications:    EKG abnormalities  History:        Patient has no prior history of Echocardiogram examinations. No                 prior cardiac hx on file.  Sonographer:    Celene Skeen RDCS (AE) Referring Phys: John Giovanni IMPRESSIONS  1. Left ventricular ejection fraction, by estimation, is 60 to 65%. The left ventricle has normal function.  2. Right ventricular systolic function is normal. The right ventricular size is normal.  3. The mitral valve is normal in structure. No evidence of mitral valve regurgitation. No evidence of mitral stenosis.  4. The aortic valve is normal in structure. Aortic valve regurgitation is not visualized. No aortic stenosis is present. FINDINGS  Left Ventricle: Left ventricular ejection fraction, by estimation, is 60 to 65%. The left ventricle has normal function. The left ventricular internal cavity size was normal in size. There is no left ventricular hypertrophy. Right Ventricle: The right ventricular size is normal. No increase in right ventricular wall thickness. Right ventricular systolic function is normal. Left Atrium: Left atrial size was normal in size. Right Atrium: Right atrial size was normal in size. Pericardium: Trivial pericardial effusion is present. Mitral Valve: The mitral valve is normal in structure. No evidence of mitral valve stenosis. Tricuspid Valve: The tricuspid valve is grossly normal. Tricuspid valve regurgitation is not demonstrated. Aortic Valve: The aortic valve is normal in structure. Aortic valve regurgitation is not visualized. No aortic stenosis is present. Pulmonic Valve: The pulmonic valve was normal in structure. Pulmonic  valve regurgitation is not visualized. Aorta: The aortic root and ascending aorta are structurally normal, with no evidence of dilitation. IAS/Shunts: The atrial septum is grossly normal. Kristeen Miss MD Electronically signed by Kristeen Miss MD Signature Date/Time: 09/14/2019/4:10:14 PM    Final    [place] EKG SITE RITE  Result Date: 09/14/2019 If Site Rite image not attached, placement could not be confirmed due to current cardiac rhythm.   Lab Data:  CBC: Recent Labs  Lab 09/10/19 0232 09/13/19 1533 09/14/19 0400 09/15/19 0700 09/16/19 0424  WBC 11.9* 13.5* 13.5* 15.1* 10.1  NEUTROABS 8.8* 12.6* 12.1*  --   --   HGB 13.2 13.7 13.4 13.8 13.6  HCT 40.6 42.0 42.5 41.7 41.6  MCV 97.1 100.0 100.2* 100.5* 98.1  PLT 312 250 244 242 235   Basic Metabolic Panel: Recent Labs  Lab 09/10/19 0232 09/13/19 1533 09/14/19 0400 09/15/19 0700 09/16/19 0424  NA 135 138  --  136 134*  K 3.7 3.9  --  4.5 4.1  CL 99 104  --  102 98  CO2 25 24  --  25 26  GLUCOSE 105* 144*  --  106* 133*  BUN 25* 14  --  23 27*  CREATININE 0.80 0.69 0.65 0.67 0.76  CALCIUM 9.2 9.1  --  8.3* 9.2  MG 2.1  --   --   --   --  GFR: Estimated Creatinine Clearance: 81.2 mL/min (by C-G formula based on SCr of 0.76 mg/dL). Liver Function Tests: Recent Labs  Lab 09/10/19 0232 09/13/19 1533  AST 25 21  ALT 35 30  ALKPHOS 49 60  BILITOT 1.3* 1.5*  PROT 6.4* 6.9  ALBUMIN 3.1* 3.7   No results for input(s): LIPASE, AMYLASE in the last 168 hours. No results for input(s): AMMONIA in the last 168 hours. Coagulation Profile: Recent Labs  Lab 09/14/19 1404  INR 1.1   Cardiac Enzymes: Recent Labs  Lab 09/14/19 1445  CKTOTAL 129  CKMB 3.9   BNP (last 3 results) No results for input(s): PROBNP in the last 8760 hours. HbA1C: No results for input(s): HGBA1C in the last 72 hours. CBG: Recent Labs  Lab 09/15/19 0019 09/15/19 0337 09/15/19 0737 09/15/19 1155 09/15/19 1717  GLUCAP 137* 125* 109* 103*  108*   Lipid Profile: Recent Labs    09/13/19 1533  TRIG 104   Thyroid Function Tests: Recent Labs    09/14/19 0400  TSH 0.430   Anemia Panel: Recent Labs    09/15/19 0700 09/16/19 0424  FERRITIN 164 184   Urine analysis: No results found for: COLORURINE, APPEARANCEUR, LABSPEC, PHURINE, GLUCOSEU, HGBUR, BILIRUBINUR, KETONESUR, PROTEINUR, UROBILINOGEN, NITRITE, LEUKOCYTESUR   Kristen Bender M.D. [facility] 09/16/2019, 12:53 PM   Call night coverage person covering after 7pm

## 2019-09-16 NOTE — Progress Notes (Signed)
ANTICOAGULATION CONSULT NOTE  Pharmacy Consult for heparin Indication: DVT  No Known Allergies  Patient Measurements: Height: 5\' 4"  (162.6 cm) Weight: 218 lb 4.1 oz (99 kg) IBW/kg (Calculated) : 54.7 Heparin Dosing Weight: 78 kg  Vital Signs: Temp: 98.3 F (36.8 C) (03/29 0427) Temp Source: Oral (03/29 0427) BP: 120/76 (03/29 0427) Pulse Rate: 74 (03/29 0427)  Labs: Recent Labs     0000 09/13/19 1533 09/13/19 1533 09/13/19 1800 09/14/19 0400 09/14/19 1404 09/14/19 1445 09/14/19 1447 09/14/19 2030 09/15/19 0700 09/15/19 1630 09/16/19 0424  HGB   < > 13.7   < >  --  13.4  --   --   --   --  13.8  --  13.6  HCT  --  42.0   < >  --  42.5  --   --   --   --  41.7  --  41.6  PLT  --  250   < >  --  244  --   --   --   --  242  --  235  APTT  --   --   --   --   --  47*  --   --   --   --   --   --   LABPROT  --   --   --   --   --  14.2  --   --   --   --   --   --   INR  --   --   --   --   --  1.1  --   --   --   --   --   --   HEPARINUNFRC  --   --   --   --   --   --   --   --    < > 0.81* 0.36 0.27*  CREATININE  --  0.69   < >  --  0.65  --   --   --   --  0.67  --  0.76  CKTOTAL  --   --   --   --   --   --  129  --   --   --   --   --   CKMB  --   --   --   --   --   --  3.9  --   --   --   --   --   TROPONINIHS  --  18*  --  17  --   --   --  21*  --   --   --   --    < > = values in this interval not displayed.    Estimated Creatinine Clearance: 81.2 mL/min (by C-G formula based on SCr of 0.76 mg/dL).   Medical History: Past Medical History:  Diagnosis Date  . Anemia   . Anxiety   . GERD (gastroesophageal reflux disease)   . IBS (irritable bowel syndrome)   . Thyroid disease     Medications: Pt was not taking anticoagulants PTA -Enoxaparin 40 mg subQ dose given 3/27 @ 0950  Assessment: Pharmacy consulted to dose/monitor heparin in this 64 year old female recently hospitalized with COVID PNA. Pt presented to ED with complaints of SOB, venous doppler  on 3/27 revealed bilateral acute DVT. CTA 3/26: No evidence of PE. Pt not taking anticoagulants PTA.  Today, 09/16/19  Heparin level slightly  SUBtherapeutic now on current IV heparin rate of 650 units/hr  CBC stable  Per RN, no reported bleeding and no issues with IV site/line  Goal of Therapy:  Heparin level 0.3-0.7 units/ml Monitor platelets by anticoagulation protocol: Yes   Plan:   Increase IV heparin from 650 units/hr to 750 units/hr  Recheck heparin level 6 hours after rate change  CBC, HL daily while on heparin infusion  Monitor for signs and symptoms of bleeding  Follow along for eventual transition to PO anticoagulation  Kara Mead, PharmD 09/16/2019,5:38 AM

## 2019-09-16 NOTE — Progress Notes (Signed)
Initial Nutrition Assessment  RD working remotely.   DOCUMENTATION CODES:   Obesity unspecified  INTERVENTION:  - will order Ensure Enlive once/day, each supplement provides 350 kcal and 20 grams of protein. - will order 30 mL Prostat BID, each supplement provides 100 kcal and 15 grams of protein. - will order daily multivitamin with minerals.   NUTRITION DIAGNOSIS:   Increased nutrient needs related to acute illness, catabolic illness(COVID-19 infection) as evidenced by estimated needs.  GOAL:   Patient will meet greater than or equal to 90% of their needs  MONITOR:   PO intake, Supplement acceptance, Labs, Weight trends  REASON FOR ASSESSMENT:   Malnutrition Screening Tool  ASSESSMENT:   64 year old female with history of anemia, anxiety, GERD, IBS, thyroid disease, hospitalization 3/15-3/23 for acute hypoxic respiratory failure 2/2 acute COVID-19 viral pneumonitis (patient was treated with IV steroids, remdesivir and Actemra, discharged on 2L O2). She presented to the ED with SOB, coughing, and wheezing, received epinephrine; reported worsening SOB since discharge. CT chest negative for PE but showed marked severity patchy bilateral infiltrates, markedly increased in severity.  Patient consumed 100% of dinner on 3/27 and 75% of breakfast, 75% of lunch on 3/28. She is noted to have mild edema to BLE. Weight on 3/26 was 218 lb, weight on 3/18 was also 218 lb, and prior to this the most recently recorded weight was on 03/17/16 when she weighed 166 lb.   Per notes: - acute hypoxic respiratory failure d/t COVID-19 PNA - worsening bilateral infiltrates  -    Labs reviewed; Na: 134 mmol/l, BUN: 27 mg/dl. Medications reviewed; 500 mg ascorbic acid/day, 20 mg IV lasix/day, 40 mg solu-medrol BID, 220 mg zinc sulfate/day.     NUTRITION - FOCUSED PHYSICAL EXAM:  unable to complete at this time.   Diet Order:   Diet Order            Diet Heart Room service appropriate? Yes;  Fluid consistency: Thin  Diet effective now              EDUCATION NEEDS:   No education needs have been identified at this time  Skin:  Skin Assessment: Reviewed RN Assessment  Last BM:     Height:   Ht Readings from Last 1 Encounters:  09/13/19 5\' 4"  (1.626 m)    Weight:   Wt Readings from Last 1 Encounters:  09/13/19 99 kg    Ideal Body Weight:  54.5 kg  BMI:  Body mass index is 37.46 kg/m.  Estimated Nutritional Needs:   Kcal:  2280-2475 kcal  Protein:  115-130 grams  Fluid:  >/= 2.3 L/day     09/15/19, MS, RD, LDN, CNSC Inpatient Clinical Dietitian RD pager # available in [facility]  After hours/weekend pager # available in [facility]

## 2019-09-16 NOTE — TOC Progression Note (Signed)
Transition of Care [facility]) - Progression Note    Patient Details  Name: Kristen Bender MRN: [id number] Date of Birth: [birth year]  [facility] CM/SW Contact  Geni Bers, RN Phone Number: 09/16/2019, 4:03 PM  Clinical Narrative:    Pt from home and plan to discharge back home. Will continue to follow for discharge needs.    Expected Discharge Plan: Home/Self Care Barriers to Discharge: No Barriers Identified  Expected Discharge Plan and Services Expected Discharge Plan: Home/Self Care       Living arrangements for the past 2 months: Single Family Home                                       Social Determinants of Health (SDOH) Interventions    Readmission Risk Interventions No flowsheet data found.

## 2019-09-17 ENCOUNTER — Inpatient Hospital Stay (HOSPITAL_COMMUNITY): Payer: HRSA Program

## 2019-09-17 LAB — CBC
HCT: 40.4 % (ref 36.0–46.0)
Hemoglobin: 13.2 g/dL (ref 12.0–15.0)
MCH: 32.4 pg (ref 26.0–34.0)
MCHC: 32.7 g/dL (ref 30.0–36.0)
MCV: 99.3 fL (ref 80.0–100.0)
Platelets: 241 10*3/uL (ref 150–400)
RBC: 4.07 MIL/uL (ref 3.87–5.11)
RDW: 13.2 % (ref 11.5–15.5)
WBC: 12.5 10*3/uL — ABNORMAL HIGH (ref 4.0–10.5)
nRBC: 0 % (ref 0.0–0.2)

## 2019-09-17 LAB — C-REACTIVE PROTEIN: CRP: <0.5 mg/dL (ref <1.0)

## 2019-09-17 LAB — BASIC METABOLIC PANEL
Anion gap: 9 (ref 5–15)
BUN: 29 mg/dL — ABNORMAL HIGH (ref 8–23)
CO2: 26 mmol/L (ref 22–32)
Calcium: 9.1 mg/dL (ref 8.9–10.3)
Chloride: 101 mmol/L (ref 98–111)
Creatinine, Ser: 0.66 mg/dL (ref 0.44–1.00)
GFR calc Af Amer: >60 mL/min (ref >60)
GFR calc non Af Amer: >60 mL/min (ref >60)
Glucose, Bld: 145 mg/dL — ABNORMAL HIGH (ref 70–99)
Potassium: 4.2 mmol/L (ref 3.5–5.1)
Sodium: 136 mmol/L (ref 135–145)

## 2019-09-17 LAB — FERRITIN: Ferritin: 194 ng/mL (ref 11–307)

## 2019-09-17 LAB — HEPARIN LEVEL (UNFRACTIONATED): Heparin Unfractionated: 0.6 IU/mL (ref 0.30–0.70)

## 2019-09-17 LAB — MPO/PR-3 (ANCA) ANTIBODIES
ANCA Proteinase 3: <3.5 U/mL (ref 0.0–3.5)
Myeloperoxidase Abs: <9 U/mL (ref 0.0–9.0)

## 2019-09-17 LAB — D-DIMER, QUANTITATIVE: D-Dimer, Quant: 2.17 ug/mL-FEU — ABNORMAL HIGH (ref 0.00–0.50)

## 2019-09-17 NOTE — Progress Notes (Signed)
ANTICOAGULATION CONSULT NOTE  Pharmacy Consult for heparin Indication: DVT  No Known Allergies  Patient Measurements: Height: 5\' 4"  (162.6 cm) Weight: 218 lb 4.1 oz (99 kg) IBW/kg (Calculated) : 54.7 Heparin Dosing Weight: 78 kg  Vital Signs: Temp: 97.6 F (36.4 C) (03/30 1157) Temp Source: Oral (03/30 1157) BP: 119/84 (03/30 1157) Pulse Rate: 92 (03/30 1157)  Labs: Recent Labs    09/14/19 1404 09/14/19 1445 09/14/19 1447 09/14/19 2030 09/15/19 0700 09/15/19 1630 09/16/19 0424 09/16/19 0424 09/16/19 1330 09/16/19 2218 09/17/19 0311 09/17/19 0625  HGB  --   --   --    < > 13.8  --  13.6  --   --   --  13.2  --   HCT  --   --   --   --  41.7  --  41.6  --   --   --  40.4  --   PLT  --   --   --   --  242  --  235  --   --   --  241  --   APTT 47*  --   --   --   --   --   --   --   --   --   --   --   LABPROT 14.2  --   --   --   --   --   --   --   --   --   --   --   INR 1.1  --   --   --   --   --   --   --   --   --   --   --   HEPARINUNFRC  --   --   --    < > 0.81*   < > 0.27*   < > 0.26* 0.49  --  0.60  CREATININE  --   --   --   --  0.67  --  0.76  --   --   --  0.66  --   CKTOTAL  --  129  --   --   --   --   --   --   --   --   --   --   CKMB  --  3.9  --   --   --   --   --   --   --   --   --   --   TROPONINIHS  --   --  21*  --   --   --   --   --   --   --   --   --    < > = values in this interval not displayed.    Estimated Creatinine Clearance: 81.2 mL/min (by C-G formula based on SCr of 0.66 mg/dL).   Assessment: Pharmacy consulted to dose/monitor heparin in this 64 year old female recently hospitalized with COVID PNA. Pt presented to ED with complaints of SOB, venous doppler on 3/27 revealed bilateral acute DVT. CTA 3/26: No evidence of PE. Pt not taking anticoagulants PTA.  Today, 09/17/19 12:13 PM   Heparin level 0.6 is therapeutic on 900 units/hr  CBC remains WNL, SCr WNL  No bleeding reported  Goal of Therapy:  Heparin level 0.3-0.7  units/ml Monitor platelets by anticoagulation protocol: Yes   Plan:   Continue heparin drip at 900 units/hr  CBC, HL daily while  on heparin infusion  Monitor for signs and symptoms of bleeding  Follow along for eventual transition to PO anticoagulation  Herby Abraham, Pharm.D [phone] 09/17/2019 12:14 PM

## 2019-09-17 NOTE — Progress Notes (Signed)
NAME:  Kristen Bender, MRN:  [id number], DOB:  1955-10-20, LOS: 4 ADMISSION DATE:  09/13/2019, CONSULTATION DATE:  09/14/2019 REFERRING MD:  Dr Tana Coast, CHIEF COMPLAINT: acute hypoxemic resp failure  Brief History   bounceback with worsening hypoxemia and new bilateral DVT post covid Rx  History of present illness    64 year old [place] lady from [place] originally.  Works at [employer] in [place].  She was admitted between September 02, 2019 and September 10, 2019 with COVID-19 acute hypoxic respiratory failure.She was treated with oxygen via nonrebreather mask, IV steroids remdesivir and Tocilizumab.  At the time of discharge she was discharged on 2 L nasal cannula.  90% on 4 L.  Course was complicated by anxiety and requiring psychiatry consultation.  She got readmitted September 13, 2019 with shortness of breath, wheezing and worsening hypoxemia.  Currently on heated high flow nasal cannula.  Her procalcitonin is less than 0.1.  CRP is essentially normal as seen below.  D-dimer significantly elevated but CT angiogram ruled out pulmonary embolism [CT did show worsening infiltrates compared to her admission CT] patient is being started on IV steroids.  Doppler ultrasounds have resulted in bilateral DVT acute.  IV heparin has been started by hospitalist.  Pulmonary critical care has been called for consultation.  Patient is wondering if she can be discharged home but she is feeling okay.   She also suffers from acid reflux disease, irritable bowel syndrome thyroid disorder. [facility] from prior hospitalization indicated significant anxiety disorder  Results for Kristen Bender, Kristen Bender (MRN [id number]) as of 09/14/2019 15:27  Ref. Range 09/08/2019 04:05 09/09/2019 07:13 09/10/2019 02:32 09/13/2019 15:33  CRP Latest Ref Range: <1.0 mg/dL 0.8 <0.5 0.6 0.5   Results for Kristen Bender, Kristen Bender (MRN [id number]) as of 09/14/2019 15:27  Ref. Range 09/07/2019 03:56 09/08/2019 04:05 09/09/2019 09:16 09/10/2019 02:32 09/13/2019  15:33  D-Dimer, America Brown Latest Ref Range: 0.00 - 0.50 ug/mL-FEU 0.83 (H) 1.39 (H) 1.20 (H) 1.18 (H) 6.71 (H)   Past Medical History     has a past medical history of Anemia, Anxiety, GERD (gastroesophageal reflux disease), IBS (irritable bowel syndrome), and Thyroid disease.   reports that she has never smoked. She has never used smokeless tobacco.  Past Surgical History:  Procedure Laterality Date  . CESAREAN SECTION     X 2    No Known Allergies   There is no immunization history on file for this patient.  Family History  Problem Relation Age of Onset  . Heart disease Mother   . Hypertension Mother   . Mental illness Mother   . Heart disease Father   . Hypertension Father      Current Facility-Administered Medications:  .  acetaminophen (TYLENOL) tablet 650 mg, 650 mg, Oral, Q6H PRN, Rai, Ripudeep K, MD, 650 mg at 09/16/19 2016 .  albuterol (VENTOLIN HFA) 108 (90 Base) MCG/ACT inhaler 1-2 puff, 1-2 puff, Inhalation, Q6H PRN, Rai, Ripudeep K, MD .  ascorbic acid (VITAMIN C) tablet 500 mg, 500 mg, Oral, Daily, Rai, Ripudeep K, MD, 500 mg at 09/17/19 0907 .  chlorhexidine (PERIDEX) 0.12 % solution 15 mL, 15 mL, Mouth Rinse, BID, Rai, Ripudeep K, MD, 15 mL at 09/17/19 0906 .  Chlorhexidine Gluconate Cloth 2 % PADS 6 each, 6 each, Topical, Daily, Rai, Ripudeep K, MD, 6 each at 09/17/19 0907 .  clonazePAM (KLONOPIN) tablet 1 mg, 1 mg, Oral, BID, Rai, Ripudeep K, MD, 1 mg at 09/17/19 0906 .  feeding supplement (ENSURE ENLIVE) (ENSURE ENLIVE)  liquid 237 mL, 237 mL, Oral, Q24H, Rai, Ripudeep K, MD, 237 mL at 09/17/19 0907 .  feeding supplement (PRO-STAT SUGAR FREE 64) liquid 30 mL, 30 mL, Oral, BID, Rai, Ripudeep K, MD, 30 mL at 09/17/19 1628 .  furosemide (LASIX) injection 20 mg, 20 mg, Intravenous, Daily, Rai, Ripudeep K, MD, 20 mg at 09/17/19 0907 .  guaiFENesin (MUCINEX) 12 hr tablet 1,200 mg, 1,200 mg, Oral, BID, Rai, Ripudeep K, MD, 1,200 mg at 09/17/19 0906 .  heparin ADULT  infusion 100 units/mL (25000 units/260m sodium chloride 0.45%), 900 Units/hr, Intravenous, Continuous, Rai, Ripudeep K, MD, Last Rate: 9 mL/hr at 09/17/19 0400, 900 Units/hr at 09/17/19 0400 .  loratadine (CLARITIN) tablet 10 mg, 10 mg, Oral, Daily, Rai, Ripudeep K, MD, 10 mg at 09/17/19 0906 .  MEDLINE mouth rinse, 15 mL, Mouth Rinse, q12n4p, Rai, Ripudeep K, MD, 15 mL at 09/17/19 1628 .  methylPREDNISolone sodium succinate (SOLU-MEDROL) 40 mg/mL injection 40 mg, 40 mg, Intravenous, Q12H, Rai, Ripudeep K, MD, 40 mg at 09/17/19 1628 .  mometasone-formoterol (DULERA) 200-5 MCG/ACT inhaler 2 puff, 2 puff, Inhalation, BID, Rai, Ripudeep K, MD, 2 puff at 09/17/19 0908 .  multivitamin with minerals tablet 1 tablet, 1 tablet, Oral, Daily, Rai, Ripudeep K, MD, 1 tablet at 09/17/19 0906 .  sertraline (ZOLOFT) tablet 100 mg, 100 mg, Oral, Daily, Rai, Ripudeep K, MD, 100 mg at 09/17/19 0906 .  sodium chloride flush (NS) 0.9 % injection 10-40 mL, 10-40 mL, Intracatheter, Q12H, Rai, Ripudeep K, MD, 10 mL at 09/17/19 0914 .  sodium chloride flush (NS) 0.9 % injection 10-40 mL, 10-40 mL, Intracatheter, PRN, Rai, Ripudeep K, MD .  zinc sulfate capsule 220 mg, 220 mg, Oral, Daily, Rai, Ripudeep K, MD, 220 mg at 09/17/19 [facility]   09/13/2019 - admit  Consults:  3/27 - cccm consult  Procedures:    Significant Diagnostic Tests:  3/27 - duplex LE - DVT +  Micro Data:  RVP 3/26 - neg Urine leg 3/26 - neg Autooimmune and vasculitis 3/27 -  ESR 3/27 -   Antimicrobials:   vanc 3/26 >>3/27 Cefepime 3/26 >>3/27    Interim history/subjective:  Continues to feel improved. No sputum production, still some coughing and SOB. Using IS and flutter frequently, has been up in chair and walking.  Objective   Blood pressure 119/84, pulse 92, temperature 97.6 F (36.4 C), temperature source Oral, resp. rate (!) 22, height 5' 4" (1.626 m), weight 99 kg, SpO2 93 %.         Intake/Output Summary (Last 24 hours) at 09/17/2019 1732 Last data filed at 09/17/2019 1508 Gross per 24 hour  Intake 1011.31 ml  Output 1700 ml  Net -688.69 ml   Filed Weights   09/13/19 1415  Weight: 99 kg    Examination: General: Elderly woman sitting up in chair no acute distress watching TV HENT: [place]/AT, eyes anicteric Lungs: Symmetric rales bilaterally, breathing comfortably on 10 L nasal cannula.  No tachypnea or accessory muscle use. Cardiovascular: Regular rate and rhythm, no murmurs Abdomen: Soft, nondistended Extremities: No significant lower extremity edema, no clubbing or cyanosis. Neuro: Awake and alert, answering questions appropriately, moving all extremities spontaneously.  Resolved Hospital Problem list     Assessment & Plan:  Acute hypoxic respiratory failure with pulmonary infiltrates -bounce back in worsening after successful treatment in the initial phase of COVID-19 pneumonia. Originally diagnosed 3/15, hospitalized until 3/23 and discharged on 2L [place]. CTA with worse bilateral  peripheral infiltrates. COP seems less likely with low ESR, but  -Agree with continuing Solu-Medrol given clinical improvement with its use despite negative ESR. -Continue to titrate down supplemental oxygen.  Goal saturation greater than 85% as long as work of breathing is appropriate. -Prone positioning in bed -Continue out of bed mobility, frequent I-S and flutter valve use. --Previous autoimmune serologies were hemolyzed.  Can redraw them, but low suspicion for autoimmune disease.  Suspect this is a post Covid worsening of her pneumonia, and she may require a more prolonged course of steroids.  Will need outpatient follow up with Pulmonology.  Bilateral lower extremity DVTs -Recommend transitioning from heparin to DOAC but she will be able to go home on.  If a DOAC is not an option with her insurance coverage, recommend transition to Coumadin with Lovenox bridge while  admitted.   Best practice:  Per primary   LABS    PULMONARY Recent Labs  Lab 09/13/19 2116 09/14/19 0230  PHART 7.451* 7.442  PCO2ART 37.6 37.6  PO2ART 53.7* 57.6*  HCO3 25.8 25.4  O2SAT 87.8 90.2    CBC Recent Labs  Lab 09/15/19 0700 09/16/19 0424 09/17/19 0311  HGB 13.8 13.6 13.2  HCT 41.7 41.6 40.4  WBC 15.1* 10.1 12.5*  PLT 242 235 241    COAGULATION Recent Labs  Lab 09/14/19 1404  INR 1.1    CARDIAC  No results for input(s): TROPONINI in the last 168 hours. No results for input(s): PROBNP in the last 168 hours.   CHEMISTRY Recent Labs  Lab 09/13/19 1533 09/13/19 1533 09/14/19 0400 09/15/19 0700 09/15/19 0700 09/16/19 0424 09/17/19 0311  NA 138  --   --  136  --  134* 136  K 3.9   < >  --  4.5   < > 4.1 4.2  CL 104  --   --  102  --  98 101  CO2 24  --   --  25  --  26 26  GLUCOSE 144*  --   --  106*  --  133* 145*  BUN 14  --   --  23  --  27* 29*  CREATININE 0.69  --  0.65 0.67  --  0.76 0.66  CALCIUM 9.1  --   --  8.3*  --  9.2 9.1   < > = values in this interval not displayed.   Estimated Creatinine Clearance: 81.2 mL/min (by C-G formula based on SCr of 0.66 mg/dL).   LIVER Recent Labs  Lab 09/13/19 1533 09/14/19 1404  AST 21  --   ALT 30  --   ALKPHOS 60  --   BILITOT 1.5*  --   PROT 6.9  --   ALBUMIN 3.7  --   INR  --  1.1     INFECTIOUS Recent Labs  Lab 09/13/19 1532 09/13/19 1533 09/13/19 1800 09/14/19 1448  LATICACIDVEN 1.9  --  1.7 1.7  PROCALCITON  --  <0.10  --   --      ENDOCRINE CBG (last 3)  Recent Labs    09/15/19 0737 09/15/19 1155 09/15/19 1717  GLUCAP 109* 103* Ravalli Clark, DO 09/17/19 5:39 PM [facility]

## 2019-09-17 NOTE — Plan of Care (Signed)
  Problem: Activity: Goal: Risk for activity intolerance will decrease Outcome: Progressing   Problem: Nutrition: Goal: Adequate nutrition will be maintained Outcome: Progressing   Problem: Coping: Goal: Level of anxiety will decrease Outcome: Progressing   

## 2019-09-17 NOTE — Progress Notes (Signed)
[facility]                                                                              Patient Demographics  Kristen Bender, is a 64 y.o. female, DOB - Apr 30, 1956, WPV:[id number]  Admit date - 09/13/2019   Admitting Physician John Giovanni, MD  Outpatient Primary MD for the patient is Bing Neighbors, FNP  Outpatient specialists:   LOS - 4  days   Medical records reviewed and are as summarized below:    Chief Complaint  Patient presents with  . COVID POSITIVE  . Shortness of Breath       Brief summary   Patient is a 64 year old female with history of anemia, anxiety, GERD, IBS, thyroid disease, recent hospitalization 3/15 to 09/10/2019 for acute hypoxic respiratory failure secondary to acute COVID-19 viral pneumonitis (patient was treated with IV steroids, remdesivir and Actemra, discharged on 2 L O2) Presented to ED with shortness of breath and wheezing, received epinephrine and Solu-Medrol via EMS.  Reported worsening shortness of breath since her recent hospital discharge.  Also reported coughing, O2 sats were in the 80s on her pulse ox despite 5 L O2 at home. CT angiogram of the chest negative for PE however showed marked severity patchy bilateral infiltrates, markedly increased in severity. COVID-19 positive   Assessment & Plan    Principal Problem:  Acute hypoxic respiratory failure due to acute COVID-19 viral pneumonia during the ongoing COVID-19 pandemic- POA - Patient presented with worsening shortness of breath, hypoxia, CT angiogram of the chest showed no PE but markedly worsened bilateral infiltrates - Hypoxia improving, on 10 L O2 HFNC, was on 60 L O2 in ED, continue to wean as tolerated - Completed IV remdesivir during previous admission.  . - Continue albuterol, Dulera, flutter valve.  Antibiotics discontinued on 3/27 -Continue IV steroids, 40 mg every 12 hours - Continue Supportive care: vitamin C/zinc, albuterol,  Tylenol. -Pulmonology following, vasculitis panel negative so far -Strict I's and O's and daily weights, negative balance of 3.5 L, continue IV Lasix 20 mg today, assess in a.m.  - Continue to wean oxygen, ambulatory O2 screening daily as tolerated  - Oxygen - SpO2: 93 % O2 Flow Rate (L/min): 10 L/min FiO2 (%): 100 % - Continue to follow labs as below  Lab Results  Component Value Date   SARSCOV2NAA POSITIVE (A) 09/13/2019     Recent Labs  Lab 09/13/19 1533 09/15/19 0700 09/16/19 0424 09/17/19 0311  DDIMER 6.71* 3.23* 2.62* 2.17*  FERRITIN 153 164 184 194  CRP 0.5 <0.5 0.5 <0.5  ALT 30  --   --   --   PROCALCITON <0.10  --   --   --     Active Problems:   Leg DVT (deep venous thromboembolism), acute, bilateral (HCC) - Likely due to COVID-19 hypercoagulable state - D-dimer was elevated 6.71, CT angiogram chest negative for PE - Venous Dopplers lower extremities positive for DVT in bilateral legs - Continue IV heparin drip -  Spoke with the daughter, states patient has no insurance, Case management consulted for medication assistance or from community wellness center.  Anxiety  -Continue sertraline, Klonopinn1mg  BID   Demand ischemia, mildly elevated troponin - EKG showed T wave inversions, slight ST depressions in anterolateral leads, likely due to #1 -Currently no chest pain.  2D echo 3/27 showed EF of 60 to 65%, normal left ventricular and right ventricular function  Obesity Estimated body mass index is 37.46 kg/m as calculated from the following:   Height as of this encounter:  (1.626 m).   Weight as of this encounter: 99 kg.  Code Status: Full CODE STATUS DVT Prophylaxis: Placed on IV heparin drip for acute bilateral DVT Family Communication: Discussed all imaging results, lab results, explained to the patient and daughter on the phone on 3/30   Disposition Plan: Patient from home, anticipate discharge home once has recovered from the hypoxic respiratory  failure, COVID-19 viral pneumonitis, still on 10 L O2 via nasal cannula   Time Spent in minutes 25 minutes  Procedures:  CT angiogram of the chest  Consultants:   Pulmonary critical care  Antimicrobials:   Anti-infectives (From admission, onward)   Start     Dose/Rate Route Frequency Ordered Stop   09/14/19 2200  vancomycin (VANCOREADY) IVPB 1250 mg/250 mL  Status:  Discontinued     1,250 mg 166.7 mL/hr over 90 Minutes Intravenous Every 24 hours 09/13/19 2224 09/14/19 1550   09/13/19 2230  vancomycin (VANCOREADY) IVPB 2000 mg/400 mL     2,000 mg 200 mL/hr over 120 Minutes Intravenous  Once 09/13/19 2224 09/14/19 0155   09/13/19 2230  ceFEPIme (MAXIPIME) 2 g in sodium chloride 0.9 % 100 mL IVPB  Status:  Discontinued     2 g 200 mL/hr over 30 Minutes Intravenous Every 8 hours 09/13/19 2224 09/14/19 1550         Medications  Scheduled Meds: . vitamin C  500 mg Oral Daily  . chlorhexidine  15 mL Mouth Rinse BID  . Chlorhexidine Gluconate Cloth  6 each Topical Daily  . clonazePAM  1 mg Oral BID  . feeding supplement (ENSURE ENLIVE)  237 mL Oral Q24H  . feeding supplement (PRO-STAT SUGAR FREE 64)  30 mL Oral BID  . furosemide  20 mg Intravenous Daily  . guaiFENesin  1,200 mg Oral BID  . loratadine  10 mg Oral Daily  . mouth rinse  15 mL Mouth Rinse q12n4p  . methylPREDNISolone (SOLU-MEDROL) injection  40 mg Intravenous Q12H  . mometasone-formoterol  2 puff Inhalation BID  . multivitamin with minerals  1 tablet Oral Daily  . sertraline  100 mg Oral Daily  . sodium chloride flush  10-40 mL Intracatheter Q12H  . zinc sulfate  220 mg Oral Daily   Continuous Infusions: . heparin 900 Units/hr (09/17/19 0400)   PRN Meds:.acetaminophen, albuterol, sodium chloride flush      Subjective:   Kristen Bender was seen and examined today.  Overall improving, shortness of breath is getting better on 10 L O2 via nasal cannula.  No chest pain. Patient denies dizziness,   abdominal pain, N/V/D/C, new weakness, numbess, tingling  Objective:   Vitals:   09/17/19 0900 09/17/19 1000 09/17/19 1100 09/17/19 1157  BP:    119/84  Pulse:    92  Resp: 17 (!) 24 18 (!) 21  Temp:    97.6 F (36.4 C)  TempSrc:    Oral  SpO2:    93%  Weight:      Height:        Intake/Output Summary (Last 24 hours) at 09/17/2019 1502  Last data filed at 09/17/2019 1156 Gross per 24 hour  Intake 192.29 ml  Output 1700 ml  Net -1507.71 ml     Wt Readings from Last 3 Encounters:  09/13/19 99 kg  09/05/19 99.1 kg  03/17/16 75.3 kg    Physical Exam  General: Alert and oriented x 3, NAD  Cardiovascular: S1 S2 clear, RRR. No pedal edema b/l  Respiratory: Diminished breath sound at the bases  Gastrointestinal: Soft, nontender, nondistended, NBS  Ext: no pedal edema bilaterally  Neuro: no new deficits  Musculoskeletal: No cyanosis, clubbing  Skin: No rashes  Psych: Normal affect and demeanor, alert and oriented x3     Data Reviewed:  I have personally reviewed following labs and imaging studies  Micro Results Recent Results (from the past 240 hour(s))  Blood Culture (routine x 2)     Status: None (Preliminary result)   Collection Time: 09/13/19  3:33 PM   Specimen: BLOOD  Result Value Ref Range Status   Specimen Description   Final    BLOOD RIGHT ANTECUBITAL Performed at [facility] [address] [place], [place] [address]    Special Requests   Final    BOTTLES DRAWN AEROBIC AND ANAEROBIC Blood Culture adequate volume Performed at [facility] [address] [place], [place] [address]    Culture   Final    NO GROWTH 4 DAYS Performed at [facility], [place] [address], [place], [place] [address]    Report Status PENDING  Incomplete  Respiratory Panel by PCR     Status: None   Collection Time: 09/13/19 10:38 PM   Specimen: Nasopharyngeal Swab; Respiratory  Result Value Ref Range Status   Adenovirus NOT  DETECTED NOT DETECTED Final   Coronavirus 229E NOT DETECTED NOT DETECTED Final    Comment: (NOTE) The Coronavirus on the Respiratory Panel, DOES NOT test for the novel  Coronavirus (2019 nCoV)    Coronavirus HKU1 NOT DETECTED NOT DETECTED Final   Coronavirus NL63 NOT DETECTED NOT DETECTED Final   Coronavirus OC43 NOT DETECTED NOT DETECTED Final   Metapneumovirus NOT DETECTED NOT DETECTED Final   Rhinovirus / Enterovirus NOT DETECTED NOT DETECTED Final   Influenza A NOT DETECTED NOT DETECTED Final   Influenza B NOT DETECTED NOT DETECTED Final   Parainfluenza Virus 1 NOT DETECTED NOT DETECTED Final   Parainfluenza Virus 2 NOT DETECTED NOT DETECTED Final   Parainfluenza Virus 3 NOT DETECTED NOT DETECTED Final   Parainfluenza Virus 4 NOT DETECTED NOT DETECTED Final   Respiratory Syncytial Virus NOT DETECTED NOT DETECTED Final   Bordetella pertussis NOT DETECTED NOT DETECTED Final   Chlamydophila pneumoniae NOT DETECTED NOT DETECTED Final   Mycoplasma pneumoniae NOT DETECTED NOT DETECTED Final    Comment: Performed at [facility], [place]. [address], [place], [place] [address]  Respiratory Panel by RT PCR (Flu A&B, Covid) - Nasopharyngeal Swab     Status: Abnormal   Collection Time: 09/13/19 11:30 PM   Specimen: Nasopharyngeal Swab  Result Value Ref Range Status   SARS Coronavirus 2 by RT PCR POSITIVE (A) NEGATIVE Final    Comment: RESULT CALLED TO, READ BACK BY AND VERIFIED WITH: T,COLEY AT 0234 ON 09/14/19 BY A,MOHAMED (NOTE) SARS-CoV-2 target nucleic acids are DETECTED. SARS-CoV-2 RNA is generally detectable in upper respiratory specimens  during the acute phase of infection. Positive results are indicative of the presence of the identified virus, but do not rule out bacterial infection or  co-infection with other pathogens not detected by the test. Clinical correlation with patient history and other diagnostic information is necessary to determine patient infection status. The  expected result is Negative. Fact Sheet for Patients:  https://www.moore.com/ Fact Sheet for Healthcare Providers: https://www.young.biz/ This test is not yet approved or cleared by the [place] FDA and  has been authorized for detection and/or diagnosis of SARS-CoV-2 by FDA under an Emergency Use Authorization (EUA).  This EUA will remain in effect (meaning this test can be use d) for the duration of  the COVID-19 declaration under Section 564(b)(1) of the Act, 21 U.S.C. section 360bbb-3(b)(1), unless the authorization is terminated or revoked sooner.    Influenza A by PCR NEGATIVE NEGATIVE Final   Influenza B by PCR NEGATIVE NEGATIVE Final    Comment: (NOTE) The Xpert Xpress SARS-CoV-2/FLU/RSV assay is intended as an aid in  the diagnosis of influenza from Nasopharyngeal swab specimens and  should not be used as a sole basis for treatment. Nasal washings and  aspirates are unacceptable for Xpert Xpress SARS-CoV-2/FLU/RSV  testing. Fact Sheet for Patients: https://www.moore.com/ Fact Sheet for Healthcare Providers: https://www.young.biz/ This test is not yet approved or cleared by the [place] FDA and  has been authorized for detection and/or diagnosis of SARS-CoV-2 by  FDA under an Emergency Use Authorization (EUA). This EUA will remain  in effect (meaning this test can be used) for the duration of the  Covid-19 declaration under Section 564(b)(1) of the Act, 21  U.S.C. section 360bbb-3(b)(1), unless the authorization is  terminated or revoked. Performed at [facility], [address] [place], [place] [address]   MRSA PCR Screening     Status: None   Collection Time: 09/14/19  1:34 PM  Result Value Ref Range Status   MRSA by PCR NEGATIVE NEGATIVE Final    Comment:        The GeneXpert MRSA Assay (FDA approved for NASAL specimens only), is one component of  a comprehensive MRSA colonization surveillance program. It is not intended to diagnose MRSA infection nor to guide or monitor treatment for MRSA infections. Performed at [facility], [address], [place], [place] [address]     Radiology Reports CT Angio Chest PE W and/or Wo Contrast  Result Date: 09/13/2019 CLINICAL DATA:  Shortness of breath. EXAM: CT ANGIOGRAPHY CHEST WITH CONTRAST TECHNIQUE: Multidetector CT imaging of the chest was performed using the standard protocol during bolus administration of intravenous contrast. Multiplanar CT image reconstructions and MIPs were obtained to evaluate the vascular anatomy. CONTRAST:  OMNIPAQUE IOHEXOL 350 MG/ML SOLN COMPARISON:  September 02, 2019 FINDINGS: Cardiovascular: Satisfactory opacification of the pulmonary arteries to the segmental level. No evidence of pulmonary embolism. Normal heart size. No pericardial effusion. Mediastinum/Nodes: No enlarged mediastinal, hilar, or axillary lymph nodes. Thyroid gland, trachea, and esophagus demonstrate no significant findings. Lungs/Pleura: Marked severity patchy infiltrates are seen throughout both lungs. This is markedly increased in severity when compared to the prior study. There is no evidence of a pleural effusion or pneumothorax. Upper Abdomen: A stable 3.6 cm x 2.2 cm cystic appearing area is seen within the posteromedial aspect of the right lobe of the liver. Musculoskeletal: No chest wall abnormality. No acute or significant osseous findings. Review of the MIP images confirms the above findings. IMPRESSION: 1. No evidence of pulmonary embolism. 2. Marked severity patchy bilateral infiltrates, markedly increased in severity when compared to the prior study. Electronically Signed   By: Aram Candela  M.D.   On: 09/13/2019 18:13   CT Angio Chest PE W and/or Wo Contrast  Result Date: 09/02/2019 CLINICAL DATA:  Shortness of breath. EXAM: CT ANGIOGRAPHY CHEST WITH CONTRAST  TECHNIQUE: Multidetector CT imaging of the chest was performed using the standard protocol during bolus administration of intravenous contrast. Multiplanar CT image reconstructions and MIPs were obtained to evaluate the vascular anatomy. CONTRAST:  OMNIPAQUE IOHEXOL 350 MG/ML SOLN COMPARISON:  None. FINDINGS: Cardiovascular: Satisfactory opacification of the pulmonary arteries to the segmental level. No evidence of pulmonary embolism. Normal heart size. No pericardial effusion. Mediastinum/Nodes: No enlarged mediastinal, hilar, or axillary lymph nodes. Thyroid gland, trachea, and esophagus demonstrate no significant findings. Lungs/Pleura: Moderate severity diffuse areas of atelectasis and/or infiltrate are seen bilaterally, slightly more prominent within the bilateral lung bases. There is no evidence of a pleural effusion or pneumothorax. Upper Abdomen: A 3.7 cm x 2.1 cm cystic appearing areas seen within the posteromedial aspect of the right lobe of the liver. Musculoskeletal: Multilevel degenerative changes seen throughout the thoracic spine. Review of the MIP images confirms the above findings. IMPRESSION: 1. No evidence of acute pulmonary embolism. 2. Moderate severity diffuse areas of atelectasis and/or infiltrate bilaterally, slightly more prominent within the bilateral lung bases. 3. 3.7 cm cystic appearing lesion in the right lobe of the liver. Electronically Signed   By: Aram Candela M.D.   On: 09/02/2019 21:28   DG Chest Port 1 View  Result Date: 09/13/2019 CLINICAL DATA:  Shortness of breath, COVID-19 positivity EXAM: PORTABLE CHEST 1 VIEW COMPARISON:  08/19/2019 FINDINGS: Cardiac shadow is stable. Patchy opacities are again seen throughout both lungs slightly increased from the prior exam consistent with the given clinical history of COVID-19 positivity. No sizable effusion is noted. No bony abnormality is seen. IMPRESSION: Slight increase in patchy opacities consistent with the given  clinical history of COVID-19 positivity. Electronically Signed   By: Alcide Clever M.D.   On: 09/13/2019 16:18   DG Chest Port 1 View  Result Date: 09/08/2019 CLINICAL DATA:  COVID-19 positive.  Shortness of breath. EXAM: PORTABLE CHEST 1 VIEW COMPARISON:  September 06, 2019 FINDINGS: Bilateral pulmonary infiltrates are stable on the left and in the right base in the interval. The heart, hila, mediastinum, lungs, and pleura are otherwise unchanged. IMPRESSION: Stable bilateral pulmonary infiltrates. Electronically Signed   By: Gerome Sam III M.D   On: 09/08/2019 10:23   DG Chest Port 1 View  Result Date: 09/06/2019 CLINICAL DATA:  Shortness of breath EXAM: PORTABLE CHEST 1 VIEW COMPARISON:  09/02/2019 plain film and CT FINDINGS: Midline trachea. Mild cardiomegaly. Given differences in technique, similar left greater than right peripheral and basilar predominant interstitial opacities. Mild right hemidiaphragm elevation. No pleural effusion or pneumothorax. IMPRESSION: No significant change in basilar and peripheral predominant interstitial opacities, most consistent with COVID-19 pneumonia. Electronically Signed   By: Jeronimo Greaves M.D.   On: 09/06/2019 08:47   DG Chest Port 1 View  Result Date: 09/02/2019 CLINICAL DATA:  Short of breath. EXAM: PORTABLE CHEST 1 VIEW COMPARISON:  None FINDINGS: Cardiac enlargement. Decreased lung volumes. Bilateral peripheral and lower lobe predominant opacities are identified. No pleural effusion or edema. IMPRESSION: Bilateral peripheral and lower lobe predominant opacities, which in the acute setting are concerning for multifocal pneumonia. Electronically Signed   By: Signa Kell M.D.   On: 09/02/2019 19:51   VAS [place] LOWER EXTREMITY VENOUS (DVT)  Result Date: 09/14/2019  Lower Venous DVTStudy Indications: Covid +, elevated d-dimer=6.71.  Comparison Study: No prior study Performing Technologist: Gertie Fey MHA, RDMS, RVT, RDCS  Examination Guidelines: A  complete evaluation includes B-mode imaging, spectral Doppler, color Doppler, and power Doppler as needed of all accessible portions of each vessel. Bilateral testing is considered an integral part of a complete examination. Limited examinations for reoccurring indications may be performed as noted. The reflux portion of the exam is performed with the patient in reverse Trendelenburg.  +---------+---------------+---------+-----------+----------+--------------+ RIGHT    CompressibilityPhasicitySpontaneityPropertiesThrombus Aging +---------+---------------+---------+-----------+----------+--------------+ CFV      Full           Yes      Yes                                 +---------+---------------+---------+-----------+----------+--------------+ SFJ      Full                                                        +---------+---------------+---------+-----------+----------+--------------+ FV Prox  Full                                                        +---------+---------------+---------+-----------+----------+--------------+ FV Mid   Full                                                        +---------+---------------+---------+-----------+----------+--------------+ FV DistalFull                                                        +---------+---------------+---------+-----------+----------+--------------+ PFV      Full                                                        +---------+---------------+---------+-----------+----------+--------------+ POP      Full           Yes      Yes                                 +---------+---------------+---------+-----------+----------+--------------+ PTV      None                    No                   Acute          +---------+---------------+---------+-----------+----------+--------------+ PERO     None                    No  Acute           +---------+---------------+---------+-----------+----------+--------------+   +---------+---------------+---------+-----------+----------+--------------+ LEFT     CompressibilityPhasicitySpontaneityPropertiesThrombus Aging +---------+---------------+---------+-----------+----------+--------------+ CFV      Full           Yes      Yes                                 +---------+---------------+---------+-----------+----------+--------------+ SFJ      Full                                                        +---------+---------------+---------+-----------+----------+--------------+ FV Prox  Full                                                        +---------+---------------+---------+-----------+----------+--------------+ FV Mid   Full                                                        +---------+---------------+---------+-----------+----------+--------------+ FV DistalFull                                                        +---------+---------------+---------+-----------+----------+--------------+ PFV      Full                                                        +---------+---------------+---------+-----------+----------+--------------+ POP      Full           Yes      Yes                                 +---------+---------------+---------+-----------+----------+--------------+ PTV      None                    No                   Acute          +---------+---------------+---------+-----------+----------+--------------+ Gastroc  None                    No                   Acute          +---------+---------------+---------+-----------+----------+--------------+   Left Technical Findings: Not visualized segments include peroneal veins not adequately visualized.   Summary: RIGHT: - Findings consistent with acute deep vein thrombosis involving the right posterior tibial veins, and right peroneal veins. - No cystic structure found in  the popliteal fossa.  LEFT: - Findings  consistent with acute deep vein thrombosis involving the left posterior tibial veins, and left gastrocnemius veins. - No cystic structure found in the popliteal fossa.  *See table(s) above for measurements and observations.    Preliminary    ECHOCARDIOGRAM LIMITED  Result Date: 09/14/2019    ECHOCARDIOGRAM LIMITED REPORT   Patient Name:   Kristen Bender Date of Exam: 09/14/2019 Medical Rec #:  [id number]        Height: Accession #:    [id number]       Weight: Date of Birth:  05/31/1956         BSA: Patient Age:    64 years         BP:           121/72 mmHg Patient Gender: F                HR:           84 bpm. Exam Location:  Inpatient Procedure: 2D Echo Indications:    EKG abnormalities  History:        Patient has no prior history of Echocardiogram examinations. No                 prior cardiac hx on file.  Sonographer:    Celene Skeen RDCS (AE) Referring Phys: John Giovanni IMPRESSIONS  1. Left ventricular ejection fraction, by estimation, is 60 to 65%. The left ventricle has normal function.  2. Right ventricular systolic function is normal. The right ventricular size is normal.  3. The mitral valve is normal in structure. No evidence of mitral valve regurgitation. No evidence of mitral stenosis.  4. The aortic valve is normal in structure. Aortic valve regurgitation is not visualized. No aortic stenosis is present. FINDINGS  Left Ventricle: Left ventricular ejection fraction, by estimation, is 60 to 65%. The left ventricle has normal function. The left ventricular internal cavity size was normal in size. There is no left ventricular hypertrophy. Right Ventricle: The right ventricular size is normal. No increase in right ventricular wall thickness. Right ventricular systolic function is normal. Left Atrium: Left atrial size was normal in size. Right Atrium: Right atrial size was normal in size. Pericardium: Trivial pericardial effusion is present. Mitral Valve: The  mitral valve is normal in structure. No evidence of mitral valve stenosis. Tricuspid Valve: The tricuspid valve is grossly normal. Tricuspid valve regurgitation is not demonstrated. Aortic Valve: The aortic valve is normal in structure. Aortic valve regurgitation is not visualized. No aortic stenosis is present. Pulmonic Valve: The pulmonic valve was normal in structure. Pulmonic valve regurgitation is not visualized. Aorta: The aortic root and ascending aorta are structurally normal, with no evidence of dilitation. IAS/Shunts: The atrial septum is grossly normal. Kristeen Miss MD Electronically signed by Kristeen Miss MD Signature Date/Time: 09/14/2019/4:10:14 PM    Final    [place] EKG SITE RITE  Result Date: 09/14/2019 If Site Rite image not attached, placement could not be confirmed due to current cardiac rhythm.   Lab Data:  CBC: Recent Labs  Lab 09/13/19 1533 09/14/19 0400 09/15/19 0700 09/16/19 0424 09/17/19 0311  WBC 13.5* 13.5* 15.1* 10.1 12.5*  NEUTROABS 12.6* 12.1*  --   --   --   HGB 13.7 13.4 13.8 13.6 13.2  HCT 42.0 42.5 41.7 41.6 40.4  MCV 100.0 100.2* 100.5* 98.1 99.3  PLT 250 244 242 235 241   Basic Metabolic Panel: Recent Labs  Lab 09/13/19 1533 09/14/19 0400 09/15/19 0700 09/16/19 0424 09/17/19 5993  NA 138  --  136 134* 136  K 3.9  --  4.5 4.1 4.2  CL 104  --  102 98 101  CO2 24  --  25 26 26   GLUCOSE 144*  --  106* 133* 145*  BUN 14  --  23 27* 29*  CREATININE 0.69 0.65 0.67 0.76 0.66  CALCIUM 9.1  --  8.3* 9.2 9.1   GFR: Estimated Creatinine Clearance: 81.2 mL/min (by C-G formula based on SCr of 0.66 mg/dL). Liver Function Tests: Recent Labs  Lab 09/13/19 1533  AST 21  ALT 30  ALKPHOS 60  BILITOT 1.5*  PROT 6.9  ALBUMIN 3.7   No results for input(s): LIPASE, AMYLASE in the last 168 hours. No results for input(s): AMMONIA in the last 168 hours. Coagulation Profile: Recent Labs  Lab 09/14/19 1404  INR 1.1   Cardiac Enzymes: Recent Labs    Lab 09/14/19 1445  CKTOTAL 129  CKMB 3.9   BNP (last 3 results) No results for input(s): PROBNP in the last 8760 hours. HbA1C: No results for input(s): HGBA1C in the last 72 hours. CBG: Recent Labs  Lab 09/15/19 0019 09/15/19 0337 09/15/19 0737 09/15/19 1155 09/15/19 1717  GLUCAP 137* 125* 109* 103* 108*   Lipid Profile: No results for input(s): CHOL, HDL, LDLCALC, TRIG, CHOLHDL, LDLDIRECT in the last 72 hours. Thyroid Function Tests: No results for input(s): TSH, T4TOTAL, FREET4, T3FREE, THYROIDAB in the last 72 hours. Anemia Panel: Recent Labs    09/16/19 0424 09/17/19 0311  FERRITIN 184 194   Urine analysis: No results found for: COLORURINE, APPEARANCEUR, LABSPEC, PHURINE, GLUCOSEU, HGBUR, BILIRUBINUR, KETONESUR, PROTEINUR, UROBILINOGEN, NITRITE, LEUKOCYTESUR   Kristen Bender M.D. [facility] 09/17/2019, 3:02 PM   Call night coverage person covering after 7pm

## 2019-09-18 LAB — CBC
HCT: 39 % (ref 36.0–46.0)
Hemoglobin: 12.9 g/dL (ref 12.0–15.0)
MCH: 32.8 pg (ref 26.0–34.0)
MCHC: 33.1 g/dL (ref 30.0–36.0)
MCV: 99.2 fL (ref 80.0–100.0)
Platelets: 198 10*3/uL (ref 150–400)
RBC: 3.93 MIL/uL (ref 3.87–5.11)
RDW: 13.3 % (ref 11.5–15.5)
WBC: 13.5 10*3/uL — ABNORMAL HIGH (ref 4.0–10.5)
nRBC: 0 % (ref 0.0–0.2)

## 2019-09-18 LAB — BASIC METABOLIC PANEL
Anion gap: 7 (ref 5–15)
BUN: 29 mg/dL — ABNORMAL HIGH (ref 8–23)
CO2: 28 mmol/L (ref 22–32)
Calcium: 9.3 mg/dL (ref 8.9–10.3)
Chloride: 101 mmol/L (ref 98–111)
Creatinine, Ser: 0.64 mg/dL (ref 0.44–1.00)
GFR calc Af Amer: >60 mL/min (ref >60)
GFR calc non Af Amer: >60 mL/min (ref >60)
Glucose, Bld: 118 mg/dL — ABNORMAL HIGH (ref 70–99)
Potassium: 3.9 mmol/L (ref 3.5–5.1)
Sodium: 136 mmol/L (ref 135–145)

## 2019-09-18 LAB — CULTURE, BLOOD (ROUTINE X 2)
Culture: NO GROWTH
Special Requests: ADEQUATE

## 2019-09-18 LAB — FERRITIN: Ferritin: 180 ng/mL (ref 11–307)

## 2019-09-18 LAB — D-DIMER, QUANTITATIVE: D-Dimer, Quant: 2.17 ug/mL-FEU — ABNORMAL HIGH (ref 0.00–0.50)

## 2019-09-18 LAB — HEPARIN LEVEL (UNFRACTIONATED): Heparin Unfractionated: 0.69 IU/mL (ref 0.30–0.70)

## 2019-09-18 LAB — C-REACTIVE PROTEIN: CRP: <0.5 mg/dL (ref <1.0)

## 2019-09-18 MED ORDER — HEPARIN (PORCINE) 25000 UT/250ML-% IV SOLN
800.0000 [IU]/h | INTRAVENOUS | Status: DC
  Administered 2019-09-18 – 2019-09-19 (×2): 800 [IU]/h via INTRAVENOUS
  Filled 2019-09-18 (×2): qty 250

## 2019-09-18 NOTE — Progress Notes (Signed)
PROGRESS NOTE    Kristen Bender  UXL:[id number] DOB: December 05, 1955 DOA: 09/13/2019 PCP: Scot Jun, FNP    Brief Narrative: Patient is a 63 year old female with history of anemia, anxiety, GERD, IBS, thyroid disease, recent hospitalization 3/15 to 09/10/2019 for acute hypoxic respiratory failure secondary to acute COVID-19 viral pneumonitis (patient was treated with IV steroids, remdesivir and Actemra, discharged on 2 L O2) Presented to ED with shortness of breath and wheezing, received epinephrine and Solu-Medrol via EMS.  Reported worsening shortness of breath since her recent hospital discharge.  Also reported coughing, O2 sats were in the 80s on her pulse ox despite 5 L O2 at home. CT angiogram of the chest negative for PE however showed marked severity patchy bilateral infiltrates, markedly increased in severity. COVID-19 positive    Assessment & Plan:   Principal Problem:   Pneumonia due to COVID-19 virus Active Problems:   Elevated d-dimer   Demand ischemia (HCC)   Anxiety   Acute respiratory failure with hypoxia (HCC)   Leg DVT (deep venous thromboembolism), acute, bilateral (HCC)   1 acute hypoxic respiratory failure secondary to acute COVID-19 viral pneumonia, POA Patient had presented with worsening shortness of breath, hypoxia.  CT angiogram of the chest was negative for PE however showed marked worsening bilateral infiltrates.  Patient with slowly improving clinically.  Was on 10 L high flow O2 and prior to that was on 60 L O2 in the ED.  Patient currently on 6 L high flow nasal cannula with improvement with sats.  Patient status post IV remdesivir during previous hospitalization.  Antibiotics were discontinued on 09/14/2019 with a negative procalcitonin.  It was followed by PCCM no role for antibiotics.  Currently on IV steroids which we will continue for now.  Continue vitamin C, zinc, albuterol inhaler.  Patient on Lasix 20 mg IV daily with a urine output of 2.7 L over  the past 24 hours.  Patient is -4.213 L during this hospitalization.  Continue IV heparin.  Supportive care.  Will need outpatient follow-up with pulmonary.  Follow.  2.  Bilateral lower extremity DVT Likely secondary to COVID-19, hypercoagulable state.  D-dimer noted to be elevated on admission at 6.71.  CT angiogram chest negative for PE.  Lower extremity Dopplers positive for bilateral DVT.  Patient currently on IV heparin.  It is noted that patient has no insurance.  Case manager consulted for medication assistance or from community wellness center to determine whether patient may be able to go home on a DOAC versus full dose Lovenox.  Per pulmonary patient will need to be treated for a minimum of 3 months for provoked DVT.  Outpatient follow-up.  3.  Anxiety Continue sertraline and Klonopin 1 mg twice daily.  4.  Mildly elevated troponin/demand ischemia EKG did show T wave inversions, slight ST depressions in anterior lateral leads likely due to problem #1.  Patient asymptomatic.  2D echo from 09/14/2019 with a EF of 60 to 65%, normal LV and right ventricular function.  Follow.  5.  Obesity    DVT prophylaxis: Heparin Code Status: Full Family Communication: Updated patient.  No family at bedside. Disposition Plan:  . Patient came from: Home            . Anticipated d/c place: Home . Barriers to d/c OR conditions which need to be met to effect a safe d/c: Home once acute respiratory failure has improved.  Patient currently is on 6 L high flow nasal cannula.   Consultants:  PCCM Dr. Chase Caller 09/14/2019    Procedures:   CT angiogram chest 09/13/2019  2D echo 09/14/2019  Lower extremity Dopplers 09/14/2019  Antimicrobials:  IV cefepime 09/13/2019>>>> 09/14/2019  IV vancomycin 3/26/ 2021x1 dose.   Subjective: Patient sitting up in chair.  Feels shortness of breath is improving.  Wondering whether she is going to be able to go home today.  Denies any chest pain.  Feels  strength is improving overall compared to on admission.  Patient on 6 L high flow nasal cannula with sats of 95%.  Objective: Vitals:   09/18/19 0300 09/18/19 0400 09/18/19 0500 09/18/19 0544  BP:    116/76  Pulse:    73  Resp: '14 14 18 17  ' Temp:    (!) 97.5 F (36.4 C)  TempSrc:    Oral  SpO2:    97%  Weight:      Height:        Intake/Output Summary (Last 24 hours) at 09/18/2019 1224 Last data filed at 09/18/2019 1102 Gross per 24 hour  Intake 1074.93 ml  Output 1000 ml  Net 74.93 ml   Filed Weights   09/13/19 1415  Weight: 99 kg    Examination:  General exam: Appears calm and comfortable  Respiratory system: Decreased breath sounds in the bases.  No wheezing.  No rhonchi.  No crackles noted.  Speaking in full sentences.  Cardiovascular system: S1 & S2 heard, RRR. No JVD, murmurs, rubs, gallops or clicks. No pedal edema. Gastrointestinal system: Abdomen is nondistended, soft and nontender. No organomegaly or masses felt. Normal bowel sounds heard. Central nervous system: Alert and oriented. No focal neurological deficits. Extremities: Symmetric 5 x 5 power. Skin: No rashes, lesions or ulcers Psychiatry: Judgement and insight appear normal. Mood & affect appropriate.     Data Reviewed: I have personally reviewed following labs and imaging studies  CBC: Recent Labs  Lab 09/13/19 1533 09/13/19 1533 09/14/19 0400 09/15/19 0700 09/16/19 0424 09/17/19 0311 09/18/19 0527  WBC 13.5*   < > 13.5* 15.1* 10.1 12.5* 13.5*  NEUTROABS 12.6*  --  12.1*  --   --   --   --   HGB 13.7   < > 13.4 13.8 13.6 13.2 12.9  HCT 42.0   < > 42.5 41.7 41.6 40.4 39.0  MCV 100.0   < > 100.2* 100.5* 98.1 99.3 99.2  PLT 250   < > 244 242 235 241 198   < > = values in this interval not displayed.   Basic Metabolic Panel: Recent Labs  Lab 09/13/19 1533 09/13/19 1533 09/14/19 0400 09/15/19 0700 09/16/19 0424 09/17/19 0311 09/18/19 0527  NA 138  --   --  136 134* 136 136  K 3.9  --    --  4.5 4.1 4.2 3.9  CL 104  --   --  102 98 101 101  CO2 24  --   --  '25 26 26 28  ' GLUCOSE 144*  --   --  106* 133* 145* 118*  BUN 14  --   --  23 27* 29* 29*  CREATININE 0.69   < > 0.65 0.67 0.76 0.66 0.64  CALCIUM 9.1  --   --  8.3* 9.2 9.1 9.3   < > = values in this interval not displayed.   GFR: Estimated Creatinine Clearance: 81.2 mL/min (by C-G formula based on SCr of 0.64 mg/dL). Liver Function Tests: Recent Labs  Lab 09/13/19 1533  AST 21  ALT 30  ALKPHOS 60  BILITOT 1.5*  PROT 6.9  ALBUMIN 3.7   No results for input(s): LIPASE, AMYLASE in the last 168 hours. No results for input(s): AMMONIA in the last 168 hours. Coagulation Profile: Recent Labs  Lab 09/14/19 1404  INR 1.1   Cardiac Enzymes: Recent Labs  Lab 09/14/19 1445  CKTOTAL 129  CKMB 3.9   BNP (last 3 results) No results for input(s): PROBNP in the last 8760 hours. HbA1C: No results for input(s): HGBA1C in the last 72 hours. CBG: Recent Labs  Lab 09/15/19 0019 09/15/19 0337 09/15/19 0737 09/15/19 1155 09/15/19 1717  GLUCAP 137* 125* 109* 103* 108*   Lipid Profile: No results for input(s): CHOL, HDL, LDLCALC, TRIG, CHOLHDL, LDLDIRECT in the last 72 hours. Thyroid Function Tests: No results for input(s): TSH, T4TOTAL, FREET4, T3FREE, THYROIDAB in the last 72 hours. Anemia Panel: Recent Labs    09/17/19 0311 09/18/19 0527  FERRITIN 194 180   Sepsis Labs: Recent Labs  Lab 09/13/19 1532 09/13/19 1533 09/13/19 1800 09/14/19 1448  PROCALCITON  --  <0.10  --   --   LATICACIDVEN 1.9  --  1.7 1.7    Recent Results (from the past 240 hour(s))  Blood Culture (routine x 2)     Status: None (Preliminary result)   Collection Time: 09/13/19  3:33 PM   Specimen: BLOOD  Result Value Ref Range Status   Specimen Description   Final    BLOOD RIGHT ANTECUBITAL Performed at [facility], [place] [address], [place], [place] [address]    Special Requests   Final    BOTTLES  DRAWN AEROBIC AND ANAEROBIC Blood Culture adequate volume Performed at [place] [address], [place], [place] [address]    Culture   Final    NO GROWTH 4 DAYS Performed at [facility], [place] [address], [place], [place] [address]    Report Status PENDING  Incomplete  Respiratory Panel by PCR     Status: None   Collection Time: 09/13/19 10:38 PM   Specimen: Nasopharyngeal Swab; Respiratory  Result Value Ref Range Status   Adenovirus NOT DETECTED NOT DETECTED Final   Coronavirus 229E NOT DETECTED NOT DETECTED Final    Comment: (NOTE) The Coronavirus on the Respiratory Panel, DOES NOT test for the novel  Coronavirus (2019 nCoV)    Coronavirus HKU1 NOT DETECTED NOT DETECTED Final   Coronavirus NL63 NOT DETECTED NOT DETECTED Final   Coronavirus OC43 NOT DETECTED NOT DETECTED Final   Metapneumovirus NOT DETECTED NOT DETECTED Final   Rhinovirus / Enterovirus NOT DETECTED NOT DETECTED Final   Influenza A NOT DETECTED NOT DETECTED Final   Influenza B NOT DETECTED NOT DETECTED Final   Parainfluenza Virus 1 NOT DETECTED NOT DETECTED Final   Parainfluenza Virus 2 NOT DETECTED NOT DETECTED Final   Parainfluenza Virus 3 NOT DETECTED NOT DETECTED Final   Parainfluenza Virus 4 NOT DETECTED NOT DETECTED Final   Respiratory Syncytial Virus NOT DETECTED NOT DETECTED Final   Bordetella pertussis NOT DETECTED NOT DETECTED Final   Chlamydophila pneumoniae NOT DETECTED NOT DETECTED Final   Mycoplasma pneumoniae NOT DETECTED NOT DETECTED Final    Comment: Performed at [facility], [place]. [address], [place], [place] [address]  Respiratory Panel by RT PCR (Flu A&B, Covid) - Nasopharyngeal Swab     Status: Abnormal   Collection Time: 09/13/19 11:30 PM   Specimen: Nasopharyngeal Swab  Result Value Ref Range Status   SARS Coronavirus  2 by RT PCR POSITIVE (A) NEGATIVE Final    Comment: RESULT CALLED TO, READ BACK BY AND VERIFIED WITH: T,COLEY AT 0234 ON 09/14/19 BY  A,MOHAMED (NOTE) SARS-CoV-2 target nucleic acids are DETECTED. SARS-CoV-2 RNA is generally detectable in upper respiratory specimens  during the acute phase of infection. Positive results are indicative of the presence of the identified virus, but do not rule out bacterial infection or co-infection with other pathogens not detected by the test. Clinical correlation with patient history and other diagnostic information is necessary to determine patient infection status. The expected result is Negative. Fact Sheet for Patients:  PinkCheek.be Fact Sheet for Healthcare Providers: GravelBags.it This test is not yet approved or cleared by the [place] FDA and  has been authorized for detection and/or diagnosis of SARS-CoV-2 by FDA under an Emergency Use Authorization (EUA).  This EUA will remain in effect (meaning this test can be use d) for the duration of  the COVID-19 declaration under Section 564(b)(1) of the Act, 21 U.S.C. section 360bbb-3(b)(1), unless the authorization is terminated or revoked sooner.    Influenza A by PCR NEGATIVE NEGATIVE Final   Influenza B by PCR NEGATIVE NEGATIVE Final    Comment: (NOTE) The Xpert Xpress SARS-CoV-2/FLU/RSV assay is intended as an aid in  the diagnosis of influenza from Nasopharyngeal swab specimens and  should not be used as a sole basis for treatment. Nasal washings and  aspirates are unacceptable for Xpert Xpress SARS-CoV-2/FLU/RSV  testing. Fact Sheet for Patients: PinkCheek.be Fact Sheet for Healthcare Providers: GravelBags.it This test is not yet approved or cleared by the [place] FDA and  has been authorized for detection and/or diagnosis of SARS-CoV-2 by  FDA under an Emergency Use Authorization (EUA). This EUA will remain  in effect (meaning this test can be used) for the duration of the  Covid-19 declaration  under Section 564(b)(1) of the Act, 21  U.S.C. section 360bbb-3(b)(1), unless the authorization is  terminated or revoked. Performed at [facility], [place] [address], [place], [place] [address]   MRSA PCR Screening     Status: None   Collection Time: 09/14/19  1:34 PM  Result Value Ref Range Status   MRSA by PCR NEGATIVE NEGATIVE Final    Comment:        The GeneXpert MRSA Assay (FDA approved for NASAL specimens only), is one component of a comprehensive MRSA colonization surveillance program. It is not intended to diagnose MRSA infection nor to guide or monitor treatment for MRSA infections. Performed at [facility], [place] [address] [place], [place] [address]          Radiology Studies: DG CHEST PORT 1 VIEW  Result Date: 09/17/2019 CLINICAL DATA:  Shortness of breath. COVID positive. EXAM: PORTABLE CHEST 1 VIEW COMPARISON:  Radiograph and CT 09/13/2019. FINDINGS: Low lung volumes persist but are slightly improved compared to prior exam. Heterogeneous bilateral lung opacities, improvement in the mid lower lung zones from prior. Unchanged heart size and mediastinal contours. No pneumothorax or large pleural effusion. IMPRESSION: Slight improved aeration with diminishing bilateral lung opacities consistent with improving COVID pneumonia. Electronically Signed   By: Keith Rake M.D.   On: 09/17/2019 19:12        Scheduled Meds: . vitamin C  500 mg Oral Daily  . chlorhexidine  15 mL Mouth Rinse BID  . Chlorhexidine Gluconate Cloth  6 each Topical Daily  . clonazePAM  1 mg Oral BID  . feeding supplement (ENSURE  ENLIVE)  237 mL Oral Q24H  . feeding supplement (PRO-STAT SUGAR FREE 64)  30 mL Oral BID  . furosemide  20 mg Intravenous Daily  . guaiFENesin  1,200 mg Oral BID  . loratadine  10 mg Oral Daily  . mouth rinse  15 mL Mouth Rinse q12n4p  . methylPREDNISolone (SOLU-MEDROL) injection  40 mg Intravenous Q12H  .  mometasone-formoterol  2 puff Inhalation BID  . multivitamin with minerals  1 tablet Oral Daily  . sertraline  100 mg Oral Daily  . sodium chloride flush  10-40 mL Intracatheter Q12H  . zinc sulfate  220 mg Oral Daily   Continuous Infusions: . heparin 800 Units/hr (09/18/19 1053)     LOS: 5 days    Time spent: 35 minutes    Irine Seal, MD [facility]   To contact the attending provider between 7A-7P or the covering provider during after hours 7P-7A, please log into the web site [email or web address] and access using universal [place] password for that web site. If you do not have the password, please call the hospital operator.  09/18/2019, 12:24 PM

## 2019-09-18 NOTE — Progress Notes (Signed)
ANTICOAGULATION CONSULT NOTE  Pharmacy Consult for heparin Indication: DVT  No Known Allergies  Patient Measurements: Height: 5\' 4"  (162.6 cm) Weight: 218 lb 4.1 oz (99 kg) IBW/kg (Calculated) : 54.7 Heparin Dosing Weight: 78 kg  Vital Signs: Temp: 97.5 F (36.4 C) (03/31 0544) Temp Source: Oral (03/31 0544) BP: 116/76 (03/31 0544) Pulse Rate: 73 (03/31 0544)  Labs: Recent Labs    09/16/19 0424 09/16/19 0424 09/16/19 1330 09/16/19 2218 09/17/19 0311 09/17/19 0625 09/18/19 0527  HGB 13.6   < >  --   --  13.2  --  12.9  HCT 41.6  --   --   --  40.4  --  39.0  PLT 235  --   --   --  241  --  198  HEPARINUNFRC 0.27*  --    < > 0.49  --  0.60 0.69  CREATININE 0.76  --   --   --  0.66  --  0.64   < > = values in this interval not displayed.   Estimated Creatinine Clearance: 81.2 mL/min (by C-G formula based on SCr of 0.64 mg/dL).  Assessment: Pharmacy consulted to dose/monitor heparin in this 64 year old female recently hospitalized with COVID PNA. Pt presented to ED with complaints of SOB, venous doppler on 3/27 revealed bilateral acute DVT. CTA 3/26: No evidence of PE. Pt not taking anticoagulants PTA.  Today, 09/18/19    Heparin level 0.69 this am on 900 units/hr  CBC remains WNL, SCr WNL  No bleeding reported, no problems with infusion  Goal of Therapy:  Heparin level 0.3-0.7 units/ml Monitor platelets by anticoagulation protocol: Yes   Plan:   Reduce Heparin drip to 800 units/hr  CBC, HL daily while on heparin infusion  Monitor for signs and symptoms of bleeding  Follow along for eventual transition to PO anticoagulation  09/20/19 PharmD 09/18/2019, 9:30 AM

## 2019-09-19 ENCOUNTER — Ambulatory Visit: Payer: Self-pay | Admitting: Nurse Practitioner

## 2019-09-19 LAB — GLOMERULAR BASEMENT MEMBRANE ANTIBODIES: GBM Ab: 3 units (ref 0–20)

## 2019-09-19 LAB — BASIC METABOLIC PANEL
Anion gap: 8 (ref 5–15)
BUN: 28 mg/dL — ABNORMAL HIGH (ref 8–23)
CO2: 25 mmol/L (ref 22–32)
Calcium: 9.3 mg/dL (ref 8.9–10.3)
Chloride: 102 mmol/L (ref 98–111)
Creatinine, Ser: 0.6 mg/dL (ref 0.44–1.00)
GFR calc Af Amer: >60 mL/min (ref >60)
GFR calc non Af Amer: >60 mL/min (ref >60)
Glucose, Bld: 127 mg/dL — ABNORMAL HIGH (ref 70–99)
Potassium: 4 mmol/L (ref 3.5–5.1)
Sodium: 135 mmol/L (ref 135–145)

## 2019-09-19 LAB — CBC
HCT: 38.7 % (ref 36.0–46.0)
Hemoglobin: 12.8 g/dL (ref 12.0–15.0)
MCH: 33 pg (ref 26.0–34.0)
MCHC: 33.1 g/dL (ref 30.0–36.0)
MCV: 99.7 fL (ref 80.0–100.0)
Platelets: 196 10*3/uL (ref 150–400)
RBC: 3.88 MIL/uL (ref 3.87–5.11)
RDW: 13.5 % (ref 11.5–15.5)
WBC: 15.2 10*3/uL — ABNORMAL HIGH (ref 4.0–10.5)
nRBC: 0 % (ref 0.0–0.2)

## 2019-09-19 LAB — ANTI-SCLERODERMA ANTIBODY: Scleroderma (Scl-70) (ENA) Antibody, IgG: <0.2 AI (ref 0.0–0.9)

## 2019-09-19 LAB — HYPERSENSITIVITY PNEUMONITIS
A. Pullulans Abs: NEGATIVE
A.Fumigatus #1 Abs: NEGATIVE
Micropolyspora faeni, IgG: NEGATIVE
Pigeon Serum Abs: NEGATIVE
Thermoact. Saccharii: NEGATIVE
Thermoactinomyces vulgaris, IgG: NEGATIVE

## 2019-09-19 LAB — D-DIMER, QUANTITATIVE: D-Dimer, Quant: 1.64 ug/mL-FEU — ABNORMAL HIGH (ref 0.00–0.50)

## 2019-09-19 LAB — RHEUMATOID FACTOR: Rheumatoid fact SerPl-aCnc: <10 IU/mL (ref 0.0–13.9)

## 2019-09-19 LAB — ANTI-DNA ANTIBODY, DOUBLE-STRANDED: ds DNA Ab: <1 IU/mL (ref 0–9)

## 2019-09-19 LAB — HEPARIN LEVEL (UNFRACTIONATED): Heparin Unfractionated: 0.39 IU/mL (ref 0.30–0.70)

## 2019-09-19 MED ORDER — SULFAMETHOXAZOLE-TRIMETHOPRIM 800-160 MG PO TABS
1.0000 | ORAL_TABLET | ORAL | Status: DC
  Administered 2019-09-20 – 2019-09-23 (×2): 1 via ORAL
  Filled 2019-09-19 (×2): qty 1

## 2019-09-19 NOTE — Progress Notes (Signed)
PROGRESS NOTE    Kristen Bender  WCB:[id number] DOB: 07/01/1955 DOA: 09/13/2019 PCP: Scot Jun, FNP    Brief Narrative: Patient is a 64 year old female with history of anemia, anxiety, GERD, IBS, thyroid disease, recent hospitalization 3/15 to 09/10/2019 for acute hypoxic respiratory failure secondary to acute COVID-19 viral pneumonitis (patient was treated with IV steroids, remdesivir and Actemra, discharged on 2 L O2) Presented to ED with shortness of breath and wheezing, received epinephrine and Solu-Medrol via EMS.  Reported worsening shortness of breath since her recent hospital discharge.  Also reported coughing, O2 sats were in the 80s on her pulse ox despite 5 L O2 at home. CT angiogram of the chest negative for PE however showed marked severity patchy bilateral infiltrates, markedly increased in severity. COVID-19 positive    Assessment & Plan:   Principal Problem:   Pneumonia due to COVID-19 virus Active Problems:   Elevated d-dimer   Demand ischemia (HCC)   Anxiety   Acute respiratory failure with hypoxia (HCC)   Leg DVT (deep venous thromboembolism), acute, bilateral (HCC)   1 acute hypoxic respiratory failure secondary to acute COVID-19 viral pneumonia, POA ?? COP. Patient had presented with worsening shortness of breath, hypoxia.  CT angiogram of the chest was negative for PE however showed marked worsening bilateral infiltrates.  Patient with slowly improving clinically.  Was on 10 L high flow O2 and prior to that was on 60 L O2 in the ED.  Patient currently on 6 L high flow nasal cannula with improvement with sats as well as clinically.  Patient status post IV remdesivir during previous hospitalization.  Antibiotics were discontinued on 09/14/2019 with a negative procalcitonin.  It was followed by PCCM no role for antibiotics.  Currently on IV steroids which we will continue for now and will likely be a slow taper.  Continue vitamin C, zinc, albuterol inhaler.   Patient on Lasix 20 mg IV daily with a urine output of 3.150 L over the past 24 hours.  Patient is - 6.191 L during this hospitalization.  Continue IV heparin. Will place on prophylactic dose of Bactrim 3 times weekly per recommendations from PCCM to decrease risk of PCP as patient on IV steroids and will likely require a very slow taper. Supportive care.  Will need outpatient follow-up with pulmonary.  Follow.  2.  Bilateral lower extremity DVT Likely secondary to COVID-19, hypercoagulable state.  D-dimer noted to be elevated on admission at 6.71.  CT angiogram chest negative for PE.  Lower extremity Dopplers positive for bilateral DVT.  Patient currently on IV heparin.  It is noted that patient has no insurance.  Case manager consulted for medication assistance or from community wellness center to determine whether patient may be able to go home on a DOAC versus full dose Lovenox.  Per pulmonary patient will need to be treated for a minimum of 3 months for provoked DVT.  Outpatient follow-up.  3.  Anxiety Stable. Continue sertraline and Klonopin 1 mg twice daily.   4.  Mildly elevated troponin/demand ischemia EKG did show T wave inversions, slight ST depressions in anterior lateral leads likely due to problem #1.  Patient asymptomatic.  2D echo from 09/14/2019 with a EF of 60 to 65%, normal LV and right ventricular function.  Follow.  5.  Obesity    DVT prophylaxis: Heparin Code Status: Full Family Communication: Updated patient.  No family at bedside. Disposition Plan:  . Patient came from: Home            .  Anticipated d/c place: Home . Barriers to d/c OR conditions which need to be met to effect a safe d/c: Home once acute respiratory failure has improved.  Patient currently is on 6 L high flow nasal cannula.   Consultants:   PCCM Dr. Chase Caller 09/14/2019    Procedures:   CT angiogram chest 09/13/2019  2D echo 09/14/2019  Lower extremity Dopplers  09/14/2019  Antimicrobials:  IV cefepime 09/13/2019>>>> 09/14/2019  IV vancomycin 3/26/ 2021x1 dose.  Oral Bactrim 3 times weekly 09/20/2019   Subjective: Patient sitting up in chair. States shortness of breath has improved. States she is down to 6 L high flow O2 and feeling better. States her strength is improving. States not as short of breath on standing as she did prior to admission.   Objective: Vitals:   09/18/19 1635 09/18/19 2047 09/19/19 0456 09/19/19 0818  BP:  130/85 108/71   Pulse:  81 80   Resp:      Temp:  98.8 F (37.1 C) 98.3 F (36.8 C)   TempSrc:  Oral Oral   SpO2: 95% 98% 95% 96%  Weight:      Height:        Intake/Output Summary (Last 24 hours) at 09/19/2019 1218 Last data filed at 09/19/2019 0930 Gross per 24 hour  Intake 596 ml  Output 3150 ml  Net -2554 ml   Filed Weights   09/13/19 1415  Weight: 99 kg    Examination:  General exam: NAD. Respiratory system: Decreased breath sounds in the bases. Fair air movement. No wheezing. No rhonchi. Speaking in full sentences. Cardiovascular system: Regular rate rhythm no murmurs rubs or gallops. No JVD. No lower extremity edema. Gastrointestinal system: Abdomen is soft, nontender, nondistended, positive bowel sounds. No rebound. No guarding.  Central nervous system: Alert and oriented. No focal neurological deficits. Extremities: Symmetric 5 x 5 power. Skin: No rashes, lesions or ulcers Psychiatry: Judgement and insight appear normal. Mood & affect appropriate.     Data Reviewed: I have personally reviewed following labs and imaging studies  CBC: Recent Labs  Lab 09/13/19 1533 09/13/19 1533 09/14/19 0400 09/14/19 0400 09/15/19 0700 09/16/19 0424 09/17/19 0311 09/18/19 0527 09/19/19 0515  WBC 13.5*   < > 13.5*   < > 15.1* 10.1 12.5* 13.5* 15.2*  NEUTROABS 12.6*  --  12.1*  --   --   --   --   --   --   HGB 13.7   < > 13.4   < > 13.8 13.6 13.2 12.9 12.8  HCT 42.0   < > 42.5   < > 41.7 41.6 40.4  39.0 38.7  MCV 100.0   < > 100.2*   < > 100.5* 98.1 99.3 99.2 99.7  PLT 250   < > 244   < > 242 235 241 198 196   < > = values in this interval not displayed.   Basic Metabolic Panel: Recent Labs  Lab 09/15/19 0700 09/16/19 0424 09/17/19 0311 09/18/19 0527 09/19/19 0515  NA 136 134* 136 136 135  K 4.5 4.1 4.2 3.9 4.0  CL 102 98 101 101 102  CO2 '25 26 26 28 25  ' GLUCOSE 106* 133* 145* 118* 127*  BUN 23 27* 29* 29* 28*  CREATININE 0.67 0.76 0.66 0.64 0.60  CALCIUM 8.3* 9.2 9.1 9.3 9.3   GFR: Estimated Creatinine Clearance: 81.2 mL/min (by C-G formula based on SCr of 0.6 mg/dL). Liver Function Tests: Recent Labs  Lab 09/13/19 1533  AST  21  ALT 30  ALKPHOS 60  BILITOT 1.5*  PROT 6.9  ALBUMIN 3.7   No results for input(s): LIPASE, AMYLASE in the last 168 hours. No results for input(s): AMMONIA in the last 168 hours. Coagulation Profile: Recent Labs  Lab 09/14/19 1404  INR 1.1   Cardiac Enzymes: Recent Labs  Lab 09/14/19 1445  CKTOTAL 129  CKMB 3.9   BNP (last 3 results) No results for input(s): PROBNP in the last 8760 hours. HbA1C: No results for input(s): HGBA1C in the last 72 hours. CBG: Recent Labs  Lab 09/15/19 0019 09/15/19 0337 09/15/19 0737 09/15/19 1155 09/15/19 1717  GLUCAP 137* 125* 109* 103* 108*   Lipid Profile: No results for input(s): CHOL, HDL, LDLCALC, TRIG, CHOLHDL, LDLDIRECT in the last 72 hours. Thyroid Function Tests: No results for input(s): TSH, T4TOTAL, FREET4, T3FREE, THYROIDAB in the last 72 hours. Anemia Panel: Recent Labs    09/17/19 0311 09/18/19 0527  FERRITIN 194 180   Sepsis Labs: Recent Labs  Lab 09/13/19 1532 09/13/19 1533 09/13/19 1800 09/14/19 1448  PROCALCITON  --  <0.10  --   --   LATICACIDVEN 1.9  --  1.7 1.7    Recent Results (from the past 240 hour(s))  Blood Culture (routine x 2)     Status: None   Collection Time: 09/13/19  3:33 PM   Specimen: BLOOD  Result Value Ref Range Status   Specimen  Description   Final    BLOOD RIGHT ANTECUBITAL Performed at [facility], [place] [address], [place], [place] [address]    Special Requests   Final    BOTTLES DRAWN AEROBIC AND ANAEROBIC Blood Culture adequate volume Performed at [place] [address] [place], [place] [address]    Culture   Final    NO GROWTH 5 DAYS Performed at [facility], [place] [address], [place], [place] [address]    Report Status 09/18/2019 FINAL  Final  Respiratory Panel by PCR     Status: None   Collection Time: 09/13/19 10:38 PM   Specimen: Nasopharyngeal Swab; Respiratory  Result Value Ref Range Status   Adenovirus NOT DETECTED NOT DETECTED Final   Coronavirus 229E NOT DETECTED NOT DETECTED Final    Comment: (NOTE) The Coronavirus on the Respiratory Panel, DOES NOT test for the novel  Coronavirus (2019 nCoV)    Coronavirus HKU1 NOT DETECTED NOT DETECTED Final   Coronavirus NL63 NOT DETECTED NOT DETECTED Final   Coronavirus OC43 NOT DETECTED NOT DETECTED Final   Metapneumovirus NOT DETECTED NOT DETECTED Final   Rhinovirus / Enterovirus NOT DETECTED NOT DETECTED Final   Influenza A NOT DETECTED NOT DETECTED Final   Influenza B NOT DETECTED NOT DETECTED Final   Parainfluenza Virus 1 NOT DETECTED NOT DETECTED Final   Parainfluenza Virus 2 NOT DETECTED NOT DETECTED Final   Parainfluenza Virus 3 NOT DETECTED NOT DETECTED Final   Parainfluenza Virus 4 NOT DETECTED NOT DETECTED Final   Respiratory Syncytial Virus NOT DETECTED NOT DETECTED Final   Bordetella pertussis NOT DETECTED NOT DETECTED Final   Chlamydophila pneumoniae NOT DETECTED NOT DETECTED Final   Mycoplasma pneumoniae NOT DETECTED NOT DETECTED Final    Comment: Performed at [facility], [place] [address], [place], [place] [address]  Respiratory Panel by RT PCR (Flu A&B, Covid) - Nasopharyngeal Swab     Status: Abnormal   Collection Time: 09/13/19 11:30 PM   Specimen: Nasopharyngeal Swab   Result Value Ref Range  Status   SARS Coronavirus 2 by RT PCR POSITIVE (A) NEGATIVE Final    Comment: RESULT CALLED TO, READ BACK BY AND VERIFIED WITH: T,COLEY AT 0234 ON 09/14/19 BY A,MOHAMED (NOTE) SARS-CoV-2 target nucleic acids are DETECTED. SARS-CoV-2 RNA is generally detectable in upper respiratory specimens  during the acute phase of infection. Positive results are indicative of the presence of the identified virus, but do not rule out bacterial infection or co-infection with other pathogens not detected by the test. Clinical correlation with patient history and other diagnostic information is necessary to determine patient infection status. The expected result is Negative. Fact Sheet for Patients:  PinkCheek.be Fact Sheet for Healthcare Providers: GravelBags.it This test is not yet approved or cleared by the [place] FDA and  has been authorized for detection and/or diagnosis of SARS-CoV-2 by FDA under an Emergency Use Authorization (EUA).  This EUA will remain in effect (meaning this test can be use d) for the duration of  the COVID-19 declaration under Section 564(b)(1) of the Act, 21 U.S.C. section 360bbb-3(b)(1), unless the authorization is terminated or revoked sooner.    Influenza A by PCR NEGATIVE NEGATIVE Final   Influenza B by PCR NEGATIVE NEGATIVE Final    Comment: (NOTE) The Xpert Xpress SARS-CoV-2/FLU/RSV assay is intended as an aid in  the diagnosis of influenza from Nasopharyngeal swab specimens and  should not be used as a sole basis for treatment. Nasal washings and  aspirates are unacceptable for Xpert Xpress SARS-CoV-2/FLU/RSV  testing. Fact Sheet for Patients: PinkCheek.be Fact Sheet for Healthcare Providers: GravelBags.it This test is not yet approved or cleared by the [place] FDA and  has been authorized for detection and/or  diagnosis of SARS-CoV-2 by  FDA under an Emergency Use Authorization (EUA). This EUA will remain  in effect (meaning this test can be used) for the duration of the  Covid-19 declaration under Section 564(b)(1) of the Act, 21  U.S.C. section 360bbb-3(b)(1), unless the authorization is  terminated or revoked. Performed at [facility], [place] [address], [place], [place] [address]   MRSA PCR Screening     Status: None   Collection Time: 09/14/19  1:34 PM  Result Value Ref Range Status   MRSA by PCR NEGATIVE NEGATIVE Final    Comment:        The GeneXpert MRSA Assay (FDA approved for NASAL specimens only), is one component of a comprehensive MRSA colonization surveillance program. It is not intended to diagnose MRSA infection nor to guide or monitor treatment for MRSA infections. Performed at [facility], [place] [address], [place], [place] [address]          Radiology Studies: DG CHEST PORT 1 VIEW  Result Date: 09/17/2019 CLINICAL DATA:  Shortness of breath. COVID positive. EXAM: PORTABLE CHEST 1 VIEW COMPARISON:  Radiograph and CT 09/13/2019. FINDINGS: Low lung volumes persist but are slightly improved compared to prior exam. Heterogeneous bilateral lung opacities, improvement in the mid lower lung zones from prior. Unchanged heart size and mediastinal contours. No pneumothorax or large pleural effusion. IMPRESSION: Slight improved aeration with diminishing bilateral lung opacities consistent with improving COVID pneumonia. Electronically Signed   By: Keith Rake M.D.   On: 09/17/2019 19:12        Scheduled Meds: . vitamin C  500 mg Oral Daily  . chlorhexidine  15 mL Mouth Rinse BID  . Chlorhexidine Gluconate Cloth  6 each Topical Daily  . clonazePAM  1 mg Oral BID  .  feeding supplement (ENSURE ENLIVE)  237 mL Oral Q24H  . feeding supplement (PRO-STAT SUGAR FREE 64)  30 mL Oral BID  . furosemide  20 mg Intravenous Daily  .  guaiFENesin  1,200 mg Oral BID  . loratadine  10 mg Oral Daily  . mouth rinse  15 mL Mouth Rinse q12n4p  . methylPREDNISolone (SOLU-MEDROL) injection  40 mg Intravenous Q12H  . mometasone-formoterol  2 puff Inhalation BID  . multivitamin with minerals  1 tablet Oral Daily  . sertraline  100 mg Oral Daily  . sodium chloride flush  10-40 mL Intracatheter Q12H  . zinc sulfate  220 mg Oral Daily   Continuous Infusions: . heparin 800 Units/hr (09/18/19 1053)     LOS: 6 days    Time spent: 35 minutes    Irine Seal, MD [facility]   To contact the attending provider between 7A-7P or the covering provider during after hours 7P-7A, please log into the web site [email or web address] and access using universal [place] password for that web site. If you do not have the password, please call the hospital operator.  09/19/2019, 12:18 PM

## 2019-09-19 NOTE — TOC Progression Note (Signed)
Transition of Care [facility]) - Progression Note    Patient Details  Name: Kristen Bender MRN: [id number] Date of Birth: May 16, 1956  [facility] CM/SW Contact  Geni Bers, RN Phone Number: 09/19/2019, 3:37 PM  Clinical Narrative:     Pt states that if her medications are put on paper she will ask her PCP to get them for her cheaper. I will continue to follow pt and pt may qualify for one time [facility] program.   Expected Discharge Plan: Home/Self Care Barriers to Discharge: No Barriers Identified  Expected Discharge Plan and Services Expected Discharge Plan: Home/Self Care       Living arrangements for the past 2 months: Single Family Home                                       Social Determinants of Health (SDOH) Interventions    Readmission Risk Interventions No flowsheet data found.

## 2019-09-19 NOTE — Plan of Care (Signed)
Patient sat up in chair for most of 7 a to 7 p shift, also walked in the room.  Patient states her dyspnea on exertion is much improved, able to maintain oxygen saturation in the low 90s on 5 liters.  Remains on heparin drip at 800 units/hr.

## 2019-09-19 NOTE — Progress Notes (Signed)
ANTICOAGULATION CONSULT NOTE  Pharmacy Consult for heparin Indication: DVT  No Known Allergies  Patient Measurements: Height: 5\' 4"  (162.6 cm) Weight: 99 kg (218 lb 4.1 oz) IBW/kg (Calculated) : 54.7 Heparin Dosing Weight: 78 kg  Vital Signs: Temp: 98.3 F (36.8 C) (04/01 0456) Temp Source: Oral (04/01 0456) BP: 108/71 (04/01 0456) Pulse Rate: 80 (04/01 0456)  Labs: Recent Labs    09/16/19 2218 09/17/19 0311 09/17/19 0311 09/17/19 0625 09/18/19 0527 09/19/19 0515  HGB  --  13.2   < >  --  12.9 12.8  HCT  --  40.4  --   --  39.0 38.7  PLT  --  241  --   --  198 196  HEPARINUNFRC   < >  --   --  0.60 0.69 0.39  CREATININE  --  0.66  --   --  0.64 0.60   < > = values in this interval not displayed.   Estimated Creatinine Clearance: 81.2 mL/min (by C-G formula based on SCr of 0.6 mg/dL).  Assessment: Pharmacy consulted to dose/monitor heparin in this 64 year old female recently hospitalized with COVID PNA. Pt presented to ED with complaints of SOB, venous doppler on 3/27 revealed bilateral acute DVT. CTA 3/26: No evidence of PE. Pt not taking anticoagulants PTA.  Today, 09/19/19    Heparin level 0.39 therapeutic this am on 800 units/h  CBC remains WNL, SCr WNL  No bleeding reported, no problems with infusion  Goal of Therapy:  Heparin level 0.3-0.7 units/ml Monitor platelets by anticoagulation protocol: Yes   Plan:   continue Heparin drip at 800 units/hr  CBC, HL daily while on heparin infusion  Monitor for signs and symptoms of bleeding  Follow along for transition to PO anticoagulation- barrier, no insurance, TOC consulted by MD to assist  11/19/19, Pharm.D [phone] 09/19/2019 10:10 AM

## 2019-09-20 ENCOUNTER — Telehealth: Payer: Self-pay | Admitting: Critical Care Medicine

## 2019-09-20 LAB — CBC WITH DIFFERENTIAL/PLATELET
Abs Immature Granulocytes: 0.26 10*3/uL — ABNORMAL HIGH (ref 0.00–0.07)
Basophils Absolute: 0 10*3/uL (ref 0.0–0.1)
Basophils Relative: 0 %
Eosinophils Absolute: 0 10*3/uL (ref 0.0–0.5)
Eosinophils Relative: 0 %
HCT: 40.5 % (ref 36.0–46.0)
Hemoglobin: 13.4 g/dL (ref 12.0–15.0)
Immature Granulocytes: 2 %
Lymphocytes Relative: 8 %
Lymphs Abs: 1.3 10*3/uL (ref 0.7–4.0)
MCH: 33.1 pg (ref 26.0–34.0)
MCHC: 33.1 g/dL (ref 30.0–36.0)
MCV: 100 fL (ref 80.0–100.0)
Monocytes Absolute: 0.8 10*3/uL (ref 0.1–1.0)
Monocytes Relative: 5 %
Neutro Abs: 13.7 10*3/uL — ABNORMAL HIGH (ref 1.7–7.7)
Neutrophils Relative %: 85 %
Platelets: 190 10*3/uL (ref 150–400)
RBC: 4.05 MIL/uL (ref 3.87–5.11)
RDW: 13.6 % (ref 11.5–15.5)
WBC: 16.1 10*3/uL — ABNORMAL HIGH (ref 4.0–10.5)
nRBC: 0 % (ref 0.0–0.2)

## 2019-09-20 LAB — BASIC METABOLIC PANEL
Anion gap: 10 (ref 5–15)
BUN: 28 mg/dL — ABNORMAL HIGH (ref 8–23)
CO2: 27 mmol/L (ref 22–32)
Calcium: 9.6 mg/dL (ref 8.9–10.3)
Chloride: 101 mmol/L (ref 98–111)
Creatinine, Ser: 0.64 mg/dL (ref 0.44–1.00)
GFR calc Af Amer: >60 mL/min (ref >60)
GFR calc non Af Amer: >60 mL/min (ref >60)
Glucose, Bld: 132 mg/dL — ABNORMAL HIGH (ref 70–99)
Potassium: 4.6 mmol/L (ref 3.5–5.1)
Sodium: 138 mmol/L (ref 135–145)

## 2019-09-20 LAB — MPO/PR-3 (ANCA) ANTIBODIES
ANCA Proteinase 3: <3.5 U/mL (ref 0.0–3.5)
Myeloperoxidase Abs: <9 U/mL (ref 0.0–9.0)

## 2019-09-20 LAB — PHOSPHORUS: Phosphorus: 3.6 mg/dL (ref 2.5–4.6)

## 2019-09-20 LAB — HEPARIN LEVEL (UNFRACTIONATED): Heparin Unfractionated: 0.47 IU/mL (ref 0.30–0.70)

## 2019-09-20 LAB — MAGNESIUM: Magnesium: 2.5 mg/dL — ABNORMAL HIGH (ref 1.7–2.4)

## 2019-09-20 LAB — ANTINUCLEAR ANTIBODIES, IFA: ANA Ab, IFA: NEGATIVE

## 2019-09-20 LAB — CYCLIC CITRUL PEPTIDE ANTIBODY, IGG/IGA: CCP Antibodies IgG/IgA: 14 units (ref 0–19)

## 2019-09-20 MED ORDER — METHYLPREDNISOLONE SODIUM SUCC 40 MG IJ SOLR
40.0000 mg | Freq: Every day | INTRAMUSCULAR | Status: DC
  Administered 2019-09-21 – 2019-09-23 (×3): 40 mg via INTRAVENOUS
  Filled 2019-09-20 (×3): qty 1

## 2019-09-20 MED ORDER — APIXABAN (ELIQUIS) EDUCATION KIT FOR DVT/PE PATIENTS
PACK | Freq: Once | Status: DC
  Filled 2019-09-20: qty 1

## 2019-09-20 MED ORDER — APIXABAN 5 MG PO TABS
10.0000 mg | ORAL_TABLET | Freq: Two times a day (BID) | ORAL | Status: DC
  Administered 2019-09-20 – 2019-09-24 (×9): 10 mg via ORAL
  Filled 2019-09-20 (×9): qty 2

## 2019-09-20 MED ORDER — APIXABAN 5 MG PO TABS: 5.0000 mg | ORAL_TABLET | Freq: Two times a day (BID) | ORAL | Status: DC

## 2019-09-20 NOTE — Plan of Care (Signed)
Autoimmune workup all negative. 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 Recommend a slow steroid taper over 1-2 months. Likely stable to transition to once daily steroids at this point. Given that she is likely to remain on steroids  >1 month, recommend starting Bactrim 875mg   once daily on MWF for PJP prophylaxis.  She should follow up in Pulmonology clinic in 2-4 weeks. An appointment has been requested.  Will sign off. Please call with questions.  , DO 09/20/19 6:07 PM [facility]

## 2019-09-20 NOTE — Progress Notes (Addendum)
ANTICOAGULATION CONSULT NOTE  Pharmacy Consult for heparin>>Eliquis Indication: DVT  No Known Allergies  Patient Measurements: Height: 5\' 4"  (162.6 cm) Weight: 99 kg (218 lb 4.1 oz) IBW/kg (Calculated) : 54.7 Heparin Dosing Weight: 78 kg  Vital Signs: Temp: 98.3 F (36.8 C) (04/02 0614) Temp Source: Oral (04/02 0614) BP: 119/82 (04/02 09-03-1982) Pulse Rate: 73 (04/02 0614)  Labs: Recent Labs    09/18/19 0527 09/18/19 0527 09/19/19 0515 09/20/19 0446  HGB 12.9   < > 12.8 13.4  HCT 39.0  --  38.7 40.5  PLT 198  --  196 190  HEPARINUNFRC 0.69  --  0.39 0.47  CREATININE 0.64  --  0.60 0.64   < > = values in this interval not displayed.   Estimated Creatinine Clearance: 81.2 mL/min (by C-G formula based on SCr of 0.64 mg/dL).  Assessment: Pharmacy consulted to dose/monitor heparin in this 64 year old female recently hospitalized with COVID PNA. Pt presented to ED with complaints of SOB, venous doppler on 3/27 revealed bilateral acute DVT. CTA 3/26: No evidence of PE. Pt not taking anticoagulants PTA.  Today, 09/20/19    Heparin level 0.47 therapeutic this am on 800 units/h  CBC remains WNL, SCr WNL  No bleeding reported, no problems with infusion  To transition to Eliquis today  Goal of Therapy:  Heparin level 0.3-0.7 units/ml Monitor platelets by anticoagulation protocol: Yes   Plan:  DC heparin drip Start Eliquis 10 mg po bid x 7 days followed by Eliquis 5 mg po bid Will give 30 day free card Will provide education DC labs (HL/CBCs)  11/20/19, Pharm.D [phone] 09/20/2019 10:34 AM

## 2019-09-20 NOTE — Progress Notes (Signed)
PROGRESS NOTE    Kristen Bender  JWL:[id number] DOB: Oct 12, 1955 DOA: 09/13/2019 PCP: Scot Jun, FNP    Brief Narrative: Patient is a 64 year old female with history of anemia, anxiety, GERD, IBS, thyroid disease, recent hospitalization 3/15 to 09/10/2019 for acute hypoxic respiratory failure secondary to acute COVID-19 viral pneumonitis (patient was treated with IV steroids, remdesivir and Actemra, discharged on 2 L O2) Presented to ED with shortness of breath and wheezing, received epinephrine and Solu-Medrol via EMS.  Reported worsening shortness of breath since her recent hospital discharge.  Also reported coughing, O2 sats were in the 80s on her pulse ox despite 5 L O2 at home. CT angiogram of the chest negative for PE however showed marked severity patchy bilateral infiltrates, markedly increased in severity. COVID-19 positive    Assessment & Plan:   Principal Problem:   Pneumonia due to COVID-19 virus Active Problems:   Elevated d-dimer   Demand ischemia (HCC)   Anxiety   Acute respiratory failure with hypoxia (HCC)   Leg DVT (deep venous thromboembolism), acute, bilateral (HCC)   1 acute hypoxic respiratory failure secondary to acute COVID-19 viral pneumonia, POA ?? COP. Patient had presented with worsening shortness of breath, hypoxia.  CT angiogram of the chest was negative for PE however showed marked worsening bilateral infiltrates.  Patient with slowly improving clinically.  Was on 10 L high flow O2 and prior to that was on 60 L O2 in the ED.  Patient currently on 4 L nasal cannula with improvement with sats as well as clinically.  Patient status post IV remdesivir during previous hospitalization.  Antibiotics were discontinued on 09/14/2019 with a negative procalcitonin.  Patient was followed by PCCM no role for antibiotics.  Currently on IV steroids which we will continue for now and will likely be a slow taper.  Continue vitamin C, zinc, albuterol inhaler.  Patient  on Lasix 20 mg IV daily with a urine output of 2.350 L over the past 24 hours.  Patient is -7.073 L during this hospitalization.  Transition from IV heparin to Eliquis.  Patient started on prophylactic dose of Bactrim 3 times weekly per recommendations from PCCM to decrease risk of PCP as patient on IV steroids and will likely require a very slow taper. Supportive care.  Will need outpatient follow-up with pulmonary.  Follow.  2.  Bilateral lower extremity DVT Likely secondary to COVID-19, hypercoagulable state.  D-dimer noted to be elevated on admission at 6.71.  CT angiogram chest negative for PE.  Lower extremity Dopplers positive for bilateral DVT.  Patient currently on IV heparin.  It is noted that patient has no insurance.  Case manager consulted for medication assistance or from community wellness center to determine whether patient may be able to go home on a DOAC versus full dose Lovenox.  Per pulmonary patient will need to be treated for a minimum of 3 months for provoked DVT.  We will transition from IV heparin to Eliquis.  Patient will receive a 30-day coupon.  Patient did state to case management that if a prescription is written her PCP might be able to help her get further refills on medication.  Outpatient follow-up.  3.  Anxiety Continue Klonopin 1 mg twice daily.  Sertraline.   4.  Mildly elevated troponin/demand ischemia EKG did show T wave inversions, slight ST depressions in anterior lateral leads likely due to problem #1.  Patient asymptomatic.  2D echo from 09/14/2019 with a EF of 60 to 65%, normal LV  and right ventricular function.  Follow.  5.  Obesity    DVT prophylaxis: Heparin>>>> Eliquis Code Status: Full Family Communication: Updated patient.  No family at bedside. Disposition Plan:  . Patient came from: Home            . Anticipated d/c place: Home . Barriers to d/c OR conditions which need to be met to effect a safe d/c: Home once acute respiratory failure has  improved, patient transitioned to oral steroid taper, and off of IV Lasix.   Consultants:   PCCM Dr. Chase Caller 09/14/2019    Procedures:   CT angiogram chest 09/13/2019  2D echo 09/14/2019  Lower extremity Dopplers 09/14/2019  Antimicrobials:  IV cefepime 09/13/2019>>>> 09/14/2019  IV vancomycin 3/26/ 2021x1 dose.  Oral Bactrim 3 times weekly 09/20/2019   Subjective: Patient sitting up in chair.  States shortness of breath is improving daily.  Stated ambulated with physical therapy in the room and feels she did well.  Currently on 4 L nasal cannula with sats of 95%.  Denies chest pain.  Denies lower extremity pain.  Denies any bleeding.   Objective: Vitals:   09/19/19 2002 09/19/19 2036 09/20/19 0614 09/20/19 0953  BP: 122/77  119/82   Pulse: 82  73   Resp:   20   Temp: 98.7 F (37.1 C)  98.3 F (36.8 C)   TempSrc: Oral  Oral   SpO2: 94% 94% 97% 95%  Weight:      Height:        Intake/Output Summary (Last 24 hours) at 09/20/2019 1245 Last data filed at 09/20/2019 0643 Gross per 24 hour  Intake 392 ml  Output 1450 ml  Net -1058 ml   Filed Weights   09/13/19 1415  Weight: 99 kg    Examination:  General exam: NAD. Respiratory system: Decreased breath sounds in the bases.  Fair air movement.  No rhonchi.  No wheezing.  Speaking in full sentences.  Cardiovascular system: RRR no murmurs rubs or gallops.  No JVD.  No lower extremity edema.  Gastrointestinal system: Abdomen is nontender, nondistended, soft, positive bowel sounds.  No rebound.  No guarding. Central nervous system: Alert and oriented. No focal neurological deficits. Extremities: Symmetric 5 x 5 power. Skin: No rashes, lesions or ulcers Psychiatry: Judgement and insight appear normal. Mood & affect appropriate.     Data Reviewed: I have personally reviewed following labs and imaging studies  CBC: Recent Labs  Lab 09/13/19 1533 09/13/19 1533 09/14/19 0400 09/15/19 0700 09/16/19 0424  09/17/19 0311 09/18/19 0527 09/19/19 0515 09/20/19 0446  WBC 13.5*   < > 13.5*   < > 10.1 12.5* 13.5* 15.2* 16.1*  NEUTROABS 12.6*  --  12.1*  --   --   --   --   --  13.7*  HGB 13.7   < > 13.4   < > 13.6 13.2 12.9 12.8 13.4  HCT 42.0   < > 42.5   < > 41.6 40.4 39.0 38.7 40.5  MCV 100.0   < > 100.2*   < > 98.1 99.3 99.2 99.7 100.0  PLT 250   < > 244   < > 235 241 198 196 190   < > = values in this interval not displayed.   Basic Metabolic Panel: Recent Labs  Lab 09/16/19 0424 09/17/19 0311 09/18/19 0527 09/19/19 0515 09/20/19 0446  NA 134* 136 136 135 138  K 4.1 4.2 3.9 4.0 4.6  CL 98 101 101 102 101  CO2 _0 GLUCOSE 133* 145* 118* 127* 132*  BUN 27* 29* 29* 28* 28*  CREATININE 0.76 0.66 0.64 0.60 0.64  CALCIUM 9.2 9.1 9.3 9.3 9.6  MG  --   --   --   --  2.5*  PHOS  --   --   --   --  3.6   GFR: Estimated Creatinine Clearance: 81.2 mL/min (by C-G formula based on SCr of 0.64 mg/dL). Liver Function Tests: Recent Labs  Lab 09/13/19 1533  AST 21  ALT 30  ALKPHOS 60  BILITOT 1.5*  PROT 6.9  ALBUMIN 3.7   No results for input(s): LIPASE, AMYLASE in the last 168 hours. No results for input(s): AMMONIA in the last 168 hours. Coagulation Profile: Recent Labs  Lab 09/14/19 1404  INR 1.1   Cardiac Enzymes: Recent Labs  Lab 09/14/19 1445  CKTOTAL 129  CKMB 3.9   BNP (last 3 results) No results for input(s): PROBNP in the last 8760 hours. HbA1C: No results for input(s): HGBA1C in the last 72 hours. CBG: Recent Labs  Lab 09/15/19 0019 09/15/19 0337 09/15/19 0737 09/15/19 1155 09/15/19 1717  GLUCAP 137* 125* 109* 103* 108*   Lipid Profile: No results for input(s): CHOL, HDL, LDLCALC, TRIG, CHOLHDL, LDLDIRECT in the last 72 hours. Thyroid Function Tests: No results for input(s): TSH, T4TOTAL, FREET4, T3FREE, THYROIDAB in the last 72 hours. Anemia Panel: Recent Labs    09/18/19 0527  FERRITIN 180   Sepsis Labs: Recent Labs  Lab  09/13/19 1532 09/13/19 1533 09/13/19 1800 09/14/19 1448  PROCALCITON  --  <0.10  --   --   LATICACIDVEN 1.9  --  1.7 1.7    Recent Results (from the past 240 hour(s))  Blood Culture (routine x 2)     Status: None   Collection Time: 09/13/19  3:33 PM   Specimen: BLOOD  Result Value Ref Range Status   Specimen Description   Final    BLOOD RIGHT ANTECUBITAL Performed at [facility], [place] [address], [place], [place] [address]    Special Requests   Final    BOTTLES DRAWN AEROBIC AND ANAEROBIC Blood Culture adequate volume Performed at [place] [address] [place], [place] [address]    Culture   Final    NO GROWTH 5 DAYS Performed at [facility], [place] [address] [place], [place] [address]    Report Status 09/18/2019 FINAL  Final  Respiratory Panel by PCR     Status: None   Collection Time: 09/13/19 10:38 PM   Specimen: Nasopharyngeal Swab; Respiratory  Result Value Ref Range Status   Adenovirus NOT DETECTED NOT DETECTED Final   Coronavirus 229E NOT DETECTED NOT DETECTED Final    Comment: (NOTE) The Coronavirus on the Respiratory Panel, DOES NOT test for the novel  Coronavirus (2019 nCoV)    Coronavirus HKU1 NOT DETECTED NOT DETECTED Final   Coronavirus NL63 NOT DETECTED NOT DETECTED Final   Coronavirus OC43 NOT DETECTED NOT DETECTED Final   Metapneumovirus NOT DETECTED NOT DETECTED Final   Rhinovirus / Enterovirus NOT DETECTED NOT DETECTED Final   Influenza A NOT DETECTED NOT DETECTED Final   Influenza B NOT DETECTED NOT DETECTED Final   Parainfluenza Virus 1 NOT DETECTED NOT DETECTED Final   Parainfluenza Virus 2 NOT DETECTED NOT DETECTED Final   Parainfluenza Virus 3 NOT DETECTED NOT DETECTED Final   Parainfluenza Virus 4 NOT DETECTED NOT DETECTED Final  Respiratory Syncytial Virus NOT DETECTED NOT DETECTED Final   Bordetella pertussis NOT DETECTED NOT DETECTED Final   Chlamydophila pneumoniae NOT DETECTED NOT  DETECTED Final   Mycoplasma pneumoniae NOT DETECTED NOT DETECTED Final    Comment: Performed at [facility], [place] [address] [place], [place] [address]  Respiratory Panel by RT PCR (Flu A&B, Covid) - Nasopharyngeal Swab     Status: Abnormal   Collection Time: 09/13/19 11:30 PM   Specimen: Nasopharyngeal Swab  Result Value Ref Range Status   SARS Coronavirus 2 by RT PCR POSITIVE (A) NEGATIVE Final    Comment: RESULT CALLED TO, READ BACK BY AND VERIFIED WITH: T,COLEY AT 0234 ON 09/14/19 BY A,MOHAMED (NOTE) SARS-CoV-2 target nucleic acids are DETECTED. SARS-CoV-2 RNA is generally detectable in upper respiratory specimens  during the acute phase of infection. Positive results are indicative of the presence of the identified virus, but do not rule out bacterial infection or co-infection with other pathogens not detected by the test. Clinical correlation with patient history and other diagnostic information is necessary to determine patient infection status. The expected result is Negative. Fact Sheet for Patients:  PinkCheek.be Fact Sheet for Healthcare Providers: GravelBags.it This test is not yet approved or cleared by the [place] FDA and  has been authorized for detection and/or diagnosis of SARS-CoV-2 by FDA under an Emergency Use Authorization (EUA).  This EUA will remain in effect (meaning this test can be use d) for the duration of  the COVID-19 declaration under Section 564(b)(1) of the Act, 21 U.S.C. section 360bbb-3(b)(1), unless the authorization is terminated or revoked sooner.    Influenza A by PCR NEGATIVE NEGATIVE Final   Influenza B by PCR NEGATIVE NEGATIVE Final    Comment: (NOTE) The Xpert Xpress SARS-CoV-2/FLU/RSV assay is intended as an aid in  the diagnosis of influenza from Nasopharyngeal swab specimens and  should not be used as a sole basis for treatment. Nasal washings and  aspirates are  unacceptable for Xpert Xpress SARS-CoV-2/FLU/RSV  testing. Fact Sheet for Patients: PinkCheek.be Fact Sheet for Healthcare Providers: GravelBags.it This test is not yet approved or cleared by the [place] FDA and  has been authorized for detection and/or diagnosis of SARS-CoV-2 by  FDA under an Emergency Use Authorization (EUA). This EUA will remain  in effect (meaning this test can be used) for the duration of the  Covid-19 declaration under Section 564(b)(1) of the Act, 21  U.S.C. section 360bbb-3(b)(1), unless the authorization is  terminated or revoked. Performed at [facility], [place] [address], [place], [place] [address]   MRSA PCR Screening     Status: None   Collection Time: 09/14/19  1:34 PM  Result Value Ref Range Status   MRSA by PCR NEGATIVE NEGATIVE Final    Comment:        The GeneXpert MRSA Assay (FDA approved for NASAL specimens only), is one component of a comprehensive MRSA colonization surveillance program. It is not intended to diagnose MRSA infection nor to guide or monitor treatment for MRSA infections. Performed at [facility], [place] [address] [place], [place] [address]          Radiology Studies: No results found.      Scheduled Meds: . apixaban  10 mg Oral BID   Followed by  . [START ON 09/27/2019] apixaban  5 mg Oral BID  . vitamin C  500 mg Oral Daily  . chlorhexidine  15 mL Mouth Rinse BID  .  Chlorhexidine Gluconate Cloth  6 each Topical Daily  . clonazePAM  1 mg Oral BID  . feeding supplement (ENSURE ENLIVE)  237 mL Oral Q24H  . feeding supplement (PRO-STAT SUGAR FREE 64)  30 mL Oral BID  . furosemide  20 mg Intravenous Daily  . guaiFENesin  1,200 mg Oral BID  . loratadine  10 mg Oral Daily  . mouth rinse  15 mL Mouth Rinse q12n4p  . methylPREDNISolone (SOLU-MEDROL) injection  40 mg Intravenous Q12H  . mometasone-formoterol  2 puff  Inhalation BID  . multivitamin with minerals  1 tablet Oral Daily  . sertraline  100 mg Oral Daily  . sodium chloride flush  10-40 mL Intracatheter Q12H  . sulfamethoxazole-trimethoprim  1 tablet Oral Once per day on Mon Wed Fri  . zinc sulfate  220 mg Oral Daily   Continuous Infusions:    LOS: 7 days    Time spent: 35 minutes    Irine Seal, MD [facility]   To contact the attending provider between 7A-7P or the covering provider during after hours 7P-7A, please log into the web site [email or web address] and access using universal [place] password for that web site. If you do not have the password, please call the hospital operator.  09/20/2019, 12:45 PM

## 2019-09-20 NOTE — Telephone Encounter (Signed)
Pulm clinic appt requested for 2-4 weeks after discharge.  Steffanie Dunn, DO 09/20/19 6:08 PM [facility]

## 2019-09-20 NOTE — Progress Notes (Signed)
Per night shift RN, midline not able to return blood and was disconnected from use.  This RN able to flush midline without difficulty and was able obtain blood return.  Flushed midline again without difficulty.  Will continue to monitor.  Patient verbalized understanding.  IV RN notified.

## 2019-09-20 NOTE — Evaluation (Signed)
Physical Therapy Evaluation Patient Details Name: Kristen Bender MRN: [id number] DOB: 01/29/1956 Today's Date: 09/20/2019   History of Present Illness  64 year old female with history of anemia, anxiety, GERD, IBS, thyroid disease, recent hospitalization 3/15 to 09/10/2019 for acute hypoxic respiratory failure secondary to acute COVID-19 viral pneumonitis. pt returned to [facility] 09/13/19 with shortness of breath and wheezing, SpO2 in 80s on 5L HFNC. CT negative for PE however with patchy infiltrates markedly incr severity, admitted with pna d/t Covid-19  Clinical Impression  Pt admitted with above diagnosis.  Pt is pleasant and motivated to work with PT.  Reviewed importance of IS and flutter valve. Educated on posture and diaphragmatic breathing techniques. Pt reports she is overall feeling better and has less DOE than the past few days.   Pt on 4L HFNC during gait in room, maintaining SpO2=92-97%, HR 100s to max 137. Returned to 5L  HFNC at rest (since HFNC was hooked to an extension on PT arrival  d/t room constraints)  Pt currently with functional limitations due to the deficits listed below (see PT Problem List). Pt will benefit from skilled PT to increase their independence and safety with mobility to allow discharge to the venue listed below.       Follow Up Recommendations Home health PT;Supervision for mobility/OOB    Equipment Recommendations  None recommended by PT    Recommendations for Other Services       Precautions / Restrictions Precautions Precautions: Fall Precaution Comments: monitor VS, currently on 5L HFNC Restrictions Weight Bearing Restrictions: No      Mobility  Bed Mobility               General bed mobility comments: NT, pt in recliner on arrival  Transfers Overall transfer level: Needs assistance Equipment used: Rolling walker (2 wheeled) Transfers: Sit to/from Stand Sit to Stand: Min guard         General transfer comment: cues for hand  placement, incr time and effort to stand from lower surface (toilet)  Ambulation/Gait Ambulation/Gait assistance: Min assist;Min guard Gait Distance (Feet): 55 Feet(15') Assistive device: Rolling walker (2 wheeled) Gait Pattern/deviations: Step-through pattern;Decreased step length - left;Decreased step length - right     General Gait Details: verbal cues for RW position and safety. pt tolerated in room distance on 4L HFNC maintaining SpO2=92-97%, HR max 137  Stairs            Wheelchair Mobility    Modified Rankin (Stroke Patients Only)       Balance           Standing balance support: No upper extremity supported;Bilateral upper extremity supported;During functional activity Standing balance-Leahy Scale: Fair                               Pertinent Vitals/Pain Pain Assessment: No/denies pain    Home Living Family/patient expects to be discharged to:: Private residence Living Arrangements: Spouse/significant other Available Help at Discharge: Family Type of Home: Apartment Home Access: Stairs to enter   Secretary/administrator of Steps: 10 Home Layout: One level Home Equipment: Environmental consultant - 2 wheels;Bedside commode      Prior Function Level of Independence: Independent with assistive device(s)         Comments: using RW at times prior to this admission     Hand Dominance        Extremity/Trunk Assessment   Upper Extremity Assessment Upper Extremity Assessment: Defer to OT  evaluation    Lower Extremity Assessment RLE Deficits / Details: ROM WFL, strength grossly 3+/5 RLE Coordination: WNL LLE Deficits / Details: ROM WFL, strength grossly 3+/5 LLE Coordination: WNL       Communication   Communication: No difficulties;Prefers language other than English(Spanish primary language, speaks [place])  Cognition Arousal/Alertness: Awake/alert Behavior During Therapy: WFL for tasks assessed/performed Overall Cognitive Status: Within  Functional Limits for tasks assessed                                        General Comments      Exercises Other Exercises Other Exercises: sit to stand x2, educated on performing 5 reps 3x/day   Assessment/Plan    PT Assessment Patient needs continued PT services  PT Problem List Decreased strength;Decreased activity tolerance;Decreased mobility;Cardiopulmonary status limiting activity       PT Treatment Interventions DME instruction;Gait training;Stair training;Functional mobility training;Therapeutic activities;Therapeutic exercise;Balance training;Cognitive remediation;Patient/family education    PT Goals (Current goals can be found in the Care Plan section)  Acute Rehab PT Goals Patient Stated Goal: get stronger and feel better PT Goal Formulation: With patient Time For Goal Achievement: 10/04/19 Potential to Achieve Goals: Good    Frequency Min 3X/week   Barriers to discharge Inaccessible home environment 10 steps    Co-evaluation               AM-PAC PT "6 Clicks" Mobility  Outcome Measure Help needed turning from your back to your side while in a flat bed without using bedrails?: None Help needed moving from lying on your back to sitting on the side of a flat bed without using bedrails?: None Help needed moving to and from a bed to a chair (including a wheelchair)?: A Little Help needed standing up from a chair using your arms (e.g., wheelchair or bedside chair)?: A Little Help needed to walk in hospital room?: A Little Help needed climbing 3-5 steps with a railing? : A Little 6 Click Score: 20    End of Session Equipment Utilized During Treatment: Oxygen Activity Tolerance: Patient tolerated treatment well Patient left: in chair;with call bell/phone within reach   PT Visit Diagnosis: Difficulty in walking, not elsewhere classified (R26.2)    Time: 0263-7858 PT Time Calculation (min) (ACUTE ONLY): 30 min   Charges:   PT  Evaluation $PT Eval Low Complexity: 1 Low PT Treatments $Gait Training: 8-22 mins        Baxter Flattery, PT   Acute Rehab Dept [facility]): [phone]   09/20/2019   [facility] 09/20/2019, 2:33 PM

## 2019-09-20 NOTE — Progress Notes (Signed)
RN Jeanella Flattery to assess midline, unable to pull back blood but midline site will flush per IV RN assessment. Patient states she does not want to be stuck by lab. At this time the recommendation is for a PICC line for guaranteed blood return unless that patient is leaving within the next day. Recommended RN to contact MD if patient continues to refuse.

## 2019-09-20 NOTE — Discharge Instructions (Addendum)
 COVID-19 COVID-19 is a respiratory infection that is caused by a virus called severe acute respiratory syndrome coronavirus 2 (SARS-CoV-2). The disease is also known as coronavirus disease or novel coronavirus. In some people, the virus may not cause any symptoms. In others, it may cause a serious infection. The infection can get worse quickly and can lead to complications, such as:  Pneumonia, or infection of the lungs.  Acute respiratory distress syndrome or ARDS. This is a condition in which fluid build-up in the lungs prevents the lungs from filling with air and passing oxygen into the blood.  Acute respiratory failure. This is a condition in which there is not enough oxygen passing from the lungs to the body or when carbon dioxide is not passing from the lungs out of the body.  Sepsis or septic shock. This is a serious bodily reaction to an infection.  Blood clotting problems.  Secondary infections due to bacteria or fungus.  Organ failure. This is when your body's organs stop working. The virus that causes COVID-19 is contagious. This means that it can spread from person to person through droplets from coughs and sneezes (respiratory secretions). What are the causes? This illness is caused by a virus. You may catch the virus by:  Breathing in droplets from an infected person. Droplets can be spread by a person breathing, speaking, singing, coughing, or sneezing.  Touching something, like a table or a doorknob, that was exposed to the virus (contaminated) and then touching your mouth, nose, or eyes. What increases the risk? Risk for infection You are more likely to be infected with this virus if you:  Are within 6 feet (2 meters) of a person with COVID-19.  Provide care for or live with a person who is infected with COVID-19.  Spend time in crowded indoor spaces or live in shared housing. Risk for serious illness You are more likely to become seriously ill from the virus if  you:  Are 50 years of age or older. The higher your age, the more you are at risk for serious illness.  Live in a nursing home or long-term care facility.  Have cancer.  Have a long-term (chronic) disease such as: ? Chronic lung disease, including chronic obstructive pulmonary disease or asthma. ? A long-term disease that lowers your body's ability to fight infection (immunocompromised). ? Heart disease, including heart failure, a condition in which the arteries that lead to the heart become narrow or blocked (coronary artery disease), a disease which makes the heart muscle thick, weak, or stiff (cardiomyopathy). ? Diabetes. ? Chronic kidney disease. ? Sickle cell disease, a condition in which red blood cells have an abnormal "sickle" shape. ? Liver disease.  Are obese. What are the signs or symptoms? Symptoms of this condition can range from mild to severe. Symptoms may appear any time from 2 to 14 days after being exposed to the virus. They include:  A fever or chills.  A cough.  Difficulty breathing.  Headaches, body aches, or muscle aches.  Runny or stuffy (congested) nose.  A sore throat.  New loss of taste or smell. Some people may also have stomach problems, such as nausea, vomiting, or diarrhea. Other people may not have any symptoms of COVID-19. How is this diagnosed? This condition may be diagnosed based on:  Your signs and symptoms, especially if: ? You live in an area with a COVID-19 outbreak. ? You recently traveled to or from an area where the virus is common. ?   You provide care for or live with a person who was diagnosed with COVID-19. ? You were exposed to a person who was diagnosed with COVID-19.  A physical exam.  Lab tests, which may include: ? Taking a sample of fluid from the back of your nose and throat (nasopharyngeal fluid), your nose, or your throat using a swab. ? A sample of mucus from your lungs (sputum). ? Blood tests.  Imaging tests,  which may include, X-rays, CT scan, or ultrasound. How is this treated? At present, there is no medicine to treat COVID-19. Medicines that treat other diseases are being used on a trial basis to see if they are effective against COVID-19. Your health care provider will talk with you about ways to treat your symptoms. For most people, the infection is mild and can be managed at home with rest, fluids, and over-the-counter medicines. Treatment for a serious infection usually takes places in a hospital intensive care unit (ICU). It may include one or more of the following treatments. These treatments are given until your symptoms improve.  Receiving fluids and medicines through an IV.  Supplemental oxygen. Extra oxygen is given through a tube in the nose, a face mask, or a hood.  Positioning you to lie on your stomach (prone position). This makes it easier for oxygen to get into the lungs.  Continuous positive airway pressure (CPAP) or bi-level positive airway pressure (BPAP) machine. This treatment uses mild air pressure to keep the airways open. A tube that is connected to a motor delivers oxygen to the body.  Ventilator. This treatment moves air into and out of the lungs by using a tube that is placed in your windpipe.  Tracheostomy. This is a procedure to create a hole in the neck so that a breathing tube can be inserted.  Extracorporeal membrane oxygenation (ECMO). This procedure gives the lungs a chance to recover by taking over the functions of the heart and lungs. It supplies oxygen to the body and removes carbon dioxide. Follow these instructions at home: Lifestyle  If you are sick, stay home except to get medical care. Your health care provider will tell you how long to stay home. Call your health care provider before you go for medical care.  Rest at home as told by your health care provider.  Do not use any products that contain nicotine or tobacco, such as cigarettes,  e-cigarettes, and chewing tobacco. If you need help quitting, ask your health care provider.  Return to your normal activities as told by your health care provider. Ask your health care provider what activities are safe for you. General instructions  Take over-the-counter and prescription medicines only as told by your health care provider.  Drink enough fluid to keep your urine pale yellow.  Keep all follow-up visits as told by your health care provider. This is important. How is this prevented?  There is no vaccine to help prevent COVID-19 infection. However, there are steps you can take to protect yourself and others from this virus. To protect yourself:   Do not travel to areas where COVID-19 is a risk. The areas where COVID-19 is reported change often. To identify high-risk areas and travel restrictions, check the CDC travel website: wwwnc.cdc.gov/travel/notices  If you live in, or must travel to, an area where COVID-19 is a risk, take precautions to avoid infection. ? Stay away from people who are sick. ? Wash your hands often with soap and water for 20 seconds. If soap and   water are not available, use an alcohol-based hand sanitizer. ? Avoid touching your mouth, face, eyes, or nose. ? Avoid going out in public, follow guidance from your state and local health authorities. ? If you must go out in public, wear a cloth face covering or face mask. Make sure your mask covers your nose and mouth. ? Avoid crowded indoor spaces. Stay at least 6 feet (2 meters) away from others. ? Disinfect objects and surfaces that are frequently touched every day. This may include:  Counters and tables.  Doorknobs and light switches.  Sinks and faucets.  Electronics, such as phones, remote controls, keyboards, computers, and tablets. To protect others: If you have symptoms of COVID-19, take steps to prevent the virus from spreading to others.  If you think you have a COVID-19 infection, contact  your health care provider right away. Tell your health care team that you think you may have a COVID-19 infection.  Stay home. Leave your house only to seek medical care. Do not use public transport.  Do not travel while you are sick.  Wash your hands often with soap and water for 20 seconds. If soap and water are not available, use alcohol-based hand sanitizer.  Stay away from other members of your household. Let healthy household members care for children and pets, if possible. If you have to care for children or pets, wash your hands often and wear a mask. If possible, stay in your own room, separate from others. Use a different bathroom.  Make sure that all people in your household wash their hands well and often.  Cough or sneeze into a tissue or your sleeve or elbow. Do not cough or sneeze into your hand or into the air.  Wear a cloth face covering or face mask. Make sure your mask covers your nose and mouth. Where to find more information  Centers for Disease Control and Prevention: www.cdc.gov/coronavirus/2019-ncov/index.html  World Health Organization: www.who.int/health-topics/coronavirus Contact a health care provider if:  You live in or have traveled to an area where COVID-19 is a risk and you have symptoms of the infection.  You have had contact with someone who has COVID-19 and you have symptoms of the infection. Get help right away if:  You have trouble breathing.  You have pain or pressure in your chest.  You have confusion.  You have bluish lips and fingernails.  You have difficulty waking from sleep.  You have symptoms that get worse. These symptoms may represent a serious problem that is an emergency. Do not wait to see if the symptoms will go away. Get medical help right away. Call your local emergency services (911 in the [place] Do not drive yourself to the hospital. Let the emergency medical personnel know if you think you have  COVID-19. Summary  COVID-19 is a respiratory infection that is caused by a virus. It is also known as coronavirus disease or novel coronavirus. It can cause serious infections, such as pneumonia, acute respiratory distress syndrome, acute respiratory failure, or sepsis.  The virus that causes COVID-19 is contagious. This means that it can spread from person to person through droplets from breathing, speaking, singing, coughing, or sneezing.  You are more likely to develop a serious illness if you are 50 years of age or older, have a weak immune system, live in a nursing home, or have chronic disease.  There is no medicine to treat COVID-19. Your health care provider will talk with you about ways to treat your   symptoms.  Take steps to protect yourself and others from infection. Wash your hands often and disinfect objects and surfaces that are frequently touched every day. Stay away from people who are sick and wear a mask if you are sick. This information is not intended to replace advice given to you by your health care provider. Make sure you discuss any questions you have with your health care provider. Document Revised: 04/05/2019 Document Reviewed: 07/12/2018 Elsevier Patient Education  2020 Else[employer] Inc.  COVID-19: How to Protect Yourself and Others Know how it spreads  There is currently no vaccine to prevent coronavirus disease 2019 (COVID-19).  The best way to prevent illness is to avoid being exposed to this virus.  The virus is thought to spread mainly from person-to-person. ? Between people who are in close contact with one another (within about 6 feet). ? Through respiratory droplets produced when an infected person coughs, sneezes or talks. ? These droplets can land in the mouths or noses of people who are nearby or possibly be inhaled into the lungs. ? COVID-19 may be spread by people who are not showing symptoms. Everyone should Clean your hands often  Wash your hands  often with soap and water for at least 20 seconds especially after you have been in a public place, or after blowing your nose, coughing, or sneezing.  If soap and water are not readily available, use a hand sanitizer that contains at least 60% alcohol. Cover all surfaces of your hands and rub them together until they feel dry.  Avoid touching your eyes, nose, and mouth with unwashed hands. Avoid close contact  Limit contact with others as much as possible.  Avoid close contact with people who are sick.  Put distance between yourself and other people. ? Remember that some people without symptoms may be able to spread virus. ? This is especially important for people who are at higher risk of getting very [employer].it Cover your mouth and nose with a mask when around others  You could spread COVID-19 to others even if you do not feel sick.  Everyone should wear a mask in public settings and when around people not living in their household, especially when social distancing is difficult to maintain. ? Masks should not be placed on young children under age 45, anyone who has trouble breathing, or is unconscious, incapacitated or otherwise unable to remove the mask without assistance.  The mask is meant to protect other people in case you are infected.  Do NOT use a facemask meant for a Research scientist (physical sciences).  Continue to keep about 6 feet between yourself and others. The mask is not a substitute for social distancing. Cover coughs and sneezes  Always cover your mouth and nose with a tissue when you cough or sneeze or use the inside of your elbow.  Throw used tissues in the trash.  Immediately wash your hands with soap and water for at least 20 seconds. If soap and water are not readily available, clean your hands with a hand sanitizer that contains at least 60% alcohol. Clean and disinfect  Clean AND disinfect  frequently touched surfaces daily. This includes tables, doorknobs, light switches, countertops, handles, desks, phones, keyboards, toilets, faucets, and sinks. [employer]time[employer].com  If surfaces are dirty, clean them: Use detergent or soap and water prior to disinfection.  Then, use a household disinfectant. You can see a list of EPA-registered household disinfectants here. [employer] 02/20/2019 This information is not intended to replace advice given to  you by your health care provider. Make sure you discuss any questions you have with your health care provider. Document Revised: 02/28/2019 Document Reviewed: 12/27/2018 Elsevier Patient Education  2020 [employer]. Information on my medicine - ELIQUIS (apixaban)  This medication education was reviewed with me or my healthcare representative as part of my discharge preparation. Why was Eliquis prescribed for you? Eliquis was prescribed to treat blood clots that may have been found in the veins of your legs (deep vein thrombosis) or in your lungs (pulmonary embolism) and to reduce the risk of them occurring again.  What do You need to know about Eliquis ? The starting dose is 10 mg (two 5 mg tablets) taken TWICE daily for the FIRST SEVEN (7) DAYS, then on Friday 09/27/2019  the dose is reduced to ONE 5 mg tablet taken TWICE daily.  Eliquis may be taken with or without food.   Try to take the dose about the same time in the morning and in the evening. If you have difficulty swallowing the tablet whole please discuss with your pharmacist how to take the medication safely.  Take Eliquis exactly as prescribed and DO NOT stop taking Eliquis without talking to the doctor who prescribed the medication.  Stopping may increase your risk of developing a new blood clot.  Refill your prescription before you run out.  After discharge, you should have regular check-up appointments  with your healthcare provider that is prescribing your Eliquis.    What do you do if you miss a dose? If a dose of ELIQUIS is not taken at the scheduled time, take it as soon as possible on the same day and twice-daily administration should be resumed. The dose should not be doubled to make up for a missed dose.  Important Safety Information A possible side effect of Eliquis is bleeding. You should call your healthcare provider right away if you experience any of the following: ? Bleeding from an injury or your nose that does not stop. ? Unusual colored urine (red or dark brown) or unusual colored stools (red or black). ? Unusual bruising for unknown reasons. ? A serious fall or if you hit your head (even if there is no bleeding).  Some medicines may interact with Eliquis and might increase your risk of bleeding or clotting while on Eliquis. To help avoid this, consult your healthcare provider or pharmacist prior to using any new prescription or non-prescription medications, including herbals, vitamins, non-steroidal anti-inflammatory drugs (NSAIDs) and supplements.  This website has more information on Eliquis (apixaban): http://www.eliquis.com/eliquis/home

## 2019-09-21 LAB — CBC WITH DIFFERENTIAL/PLATELET
Abs Immature Granulocytes: 0.49 10*3/uL — ABNORMAL HIGH (ref 0.00–0.07)
Basophils Absolute: 0.1 10*3/uL (ref 0.0–0.1)
Basophils Relative: 0 %
Eosinophils Absolute: 0 10*3/uL (ref 0.0–0.5)
Eosinophils Relative: 0 %
HCT: 42.2 % (ref 36.0–46.0)
Hemoglobin: 13.9 g/dL (ref 12.0–15.0)
Immature Granulocytes: 3 %
Lymphocytes Relative: 12 %
Lymphs Abs: 2 10*3/uL (ref 0.7–4.0)
MCH: 32.8 pg (ref 26.0–34.0)
MCHC: 32.9 g/dL (ref 30.0–36.0)
MCV: 99.5 fL (ref 80.0–100.0)
Monocytes Absolute: 1.1 10*3/uL — ABNORMAL HIGH (ref 0.1–1.0)
Monocytes Relative: 6 %
Neutro Abs: 14.1 10*3/uL — ABNORMAL HIGH (ref 1.7–7.7)
Neutrophils Relative %: 79 %
Platelets: 228 10*3/uL (ref 150–400)
RBC: 4.24 MIL/uL (ref 3.87–5.11)
RDW: 13.8 % (ref 11.5–15.5)
WBC: 17.8 10*3/uL — ABNORMAL HIGH (ref 4.0–10.5)
nRBC: 0.2 % (ref 0.0–0.2)

## 2019-09-21 LAB — BASIC METABOLIC PANEL
Anion gap: 9 (ref 5–15)
BUN: 28 mg/dL — ABNORMAL HIGH (ref 8–23)
CO2: 26 mmol/L (ref 22–32)
Calcium: 9.4 mg/dL (ref 8.9–10.3)
Chloride: 95 mmol/L — ABNORMAL LOW (ref 98–111)
Creatinine, Ser: 0.56 mg/dL (ref 0.44–1.00)
GFR calc Af Amer: >60 mL/min (ref >60)
GFR calc non Af Amer: >60 mL/min (ref >60)
Glucose, Bld: 103 mg/dL — ABNORMAL HIGH (ref 70–99)
Potassium: 4.6 mmol/L (ref 3.5–5.1)
Sodium: 130 mmol/L — ABNORMAL LOW (ref 135–145)

## 2019-09-21 NOTE — Progress Notes (Signed)
Occupational Therapy Evaluation (late entry)  Pt admitted with the below listed diagnosis and demonstrates the below listed deficits.  She is able to perform ADLs at supervision - min A level with DOE 2/4-3/4 and maintaining 02 sats >94% on 4L supplemtal 02.   She was instructed in UE exercises and encouraged use of flutter valve.  Began instruction on energy conservation techniques.  She lives with spouse and was using a RW intermittently, but otherwise, was mod I with ADLs. Anticipate good progress, and that she won't require follow up OT at discharge.  Will follow acutely to maximize safety and independence with ADLs.   No DME recommended.     09/20/19 1400  OT Visit Information  Last OT Received On 09/20/19  Assistance Needed +1  History of Present Illness 64 year old female with medical history significant of IBS, GERD, anxiety, depression, allergies who presents for 1 day of SOB.  She notes that she developed symptoms including cough about 2 weeks ago.  She was tested and resulted positive for COVID 19 8 days ago per report in the ED. Chest x-ray showsBilateral peripheral and lower lobe predominant opacities, which in the acute setting are concerning for multifocal pneumonia. Admitted 09/02/19.  Precautions  Precautions Fall;Other (comment)  Precaution Comments monitor VS  Restrictions  Weight Bearing Restrictions No  Home Living  Family/patient expects to be discharged to: Private residence  Living Arrangements Spouse/significant other  Available Help at Discharge Family  Type of [place] to enter  Entrance Stairs-Number of Steps [place] One level  Bathroom Shower/Tub Tub/shower unit  Tax adviser - 2 wheels;BSC  Prior Function  Level of Independence Independent with assistive device(s)  Comments Pt was using RW intermittently   Communication  Communication No difficulties;Other (comment)  Pain Assessment   Pain Assessment No/denies pain  Cognition  Arousal/Alertness Awake/alert  Behavior During Therapy Impulsive  Overall Cognitive Status Within Functional Limits for tasks assessed  Upper Extremity Assessment  Upper Extremity Assessment Generalized weakness  Lower Extremity Assessment  Lower Extremity Assessment Defer to PT evaluation  Cervical / Trunk Assessment  Cervical / Trunk Assessment Normal  ADL  Overall ADL's  Needs assistance/impaired  Eating/Feeding Sitting;Independent  Grooming Wash/dry hands;Wash/dry face;Oral care;Brushing hair;Min guard;Standing  Upper Body Bathing Set up;Sitting  Lower Body Bathing Min guard;Sit to/from stand  Upper Body Dressing  Set up;Sitting  Lower Body Dressing Min guard;Sit to/from stand  Lower Body Dressing Details (indicate cue type and reason) able to don/doff socks without increased DOE   Surveyor, minerals guard;Ambulation;Comfort height toilet  Toileting- Clothing Manipulation and Hygiene Supervision/safety;Sit to/from stand  Functional mobility during ADLs Min guard;Supervision/safety  Vision- History  Patient Visual Report No change from baseline  Bed Mobility  General bed mobility comments sitting up in chair   Transfers  Overall transfer level Needs assistance  Equipment used None  Transfers Sit to/from Stand  Sit to Stand Min guard  General transfer comment min guard for safety   Balance  Overall balance assessment Needs assistance  Sitting-balance support Feet unsupported;Bilateral upper extremity supported  Sitting balance-Leahy Scale Good  Standing balance support During functional activity  Standing balance-Leahy Scale Fair  General Comments  General comments (skin integrity, edema, etc.) Pt reports she is a bit fatigued as she has bene up several times today, and bathed herself, after set up.  She maintained 02 sats >94% with 4L supplemental 02.    Exercises  Exercises Other exercises  Other Exercises  Other Exercises  while standing, pt performed 2 sets 10 reps shoulder flexion bil. UE, and 2 sets 10 reps horizontal abduction with emphasis on good breathing techniques.  Encouraged her to perform these exercises while seated and encouraged use of flutter valve   OT - End of Session  Equipment Utilized During Treatment Oxygen  Activity Tolerance Patient tolerated treatment well  Patient left in chair;with call bell/phone within reach  Nurse Communication Mobility status  OT Assessment  OT Recommendation/Assessment Patient needs continued OT Services  OT Visit Diagnosis Unsteadiness on feet (R26.81)  OT Problem List Decreased strength;Decreased activity tolerance;Impaired balance (sitting and/or standing);Cardiopulmonary status limiting activity;Decreased knowledge of use of DME or AE  OT Plan  OT Frequency (ACUTE ONLY) Min 2X/week  OT Treatment/Interventions (ACUTE ONLY) Self-care/ADL training;Therapeutic exercise;Energy conservation;DME and/or AE instruction;Therapeutic activities;Patient/family education  AM-PAC OT "6 Clicks" Daily Activity Outcome Measure (Version 2)  Help from another person eating meals? 4  Help from another person taking care of personal grooming? 3  Help from another person toileting, which includes using toliet, bedpan, or urinal? 3  Help from another person bathing (including washing, rinsing, drying)? 3  Help from another person to put on and taking off regular upper body clothing? 3  Help from another person to put on and taking off regular lower body clothing? 3  6 Click Score 19  OT Recommendation  Follow Up Recommendations No OT follow up;Supervision/Assistance - 24 hour  OT Equipment None recommended by OT  Individuals Consulted  Consulted and Agree with Results and Recommendations Patient  Acute Rehab OT Goals  Patient Stated Goal to feel better and go home   OT Goal Formulation With patient  Time For Goal Achievement 10/05/19  Potential to Achieve Goals Good  OT Time  Calculation  OT Start Time (ACUTE ONLY) 1411  OT Stop Time (ACUTE ONLY) 1422  OT Time Calculation (min) 11 min  OT General Charges  $OT Visit 1 Visit  OT Evaluation  $OT Eval Moderate Complexity 1 Mod  Written Expression  Dominant Hand Right  Eber Jones., OTR/L Acute Rehabilitation Services Pager [phone] Office [phone]

## 2019-09-21 NOTE — Progress Notes (Signed)
Patient's oxygen decreased to 3L [place].  O2 sats maintaining 94-96% on 3L [place] at rest. Will continue to monitor.

## 2019-09-21 NOTE — Plan of Care (Signed)
  Problem: Health Behavior/Discharge Planning: Goal: Ability to manage health-related needs will improve Outcome: Progressing   Problem: Clinical Measurements: Goal: Will remain free from infection Outcome: Progressing Goal: Diagnostic test results will improve Outcome: Progressing Goal: Respiratory complications will improve Outcome: Progressing Goal: Cardiovascular complication will be avoided Outcome: Progressing   Problem: Activity: Goal: Risk for activity intolerance will decrease Outcome: Progressing   Problem: Elimination: Goal: Will not experience complications related to bowel motility Outcome: Progressing Goal: Will not experience complications related to urinary retention Outcome: Progressing   Problem: Safety: Goal: Ability to remain free from injury will improve Outcome: Progressing

## 2019-09-21 NOTE — Progress Notes (Signed)
PROGRESS NOTE    Kristen Bender  FUX:[id number] DOB: 1955-09-25 DOA: 09/13/2019 PCP: Scot Jun, FNP    Brief Narrative: Patient is a 64 year old female with history of anemia, anxiety, GERD, IBS, thyroid disease, recent hospitalization 3/15 to 09/10/2019 for acute hypoxic respiratory failure secondary to acute COVID-19 viral pneumonitis (patient was treated with IV steroids, remdesivir and Actemra, discharged on 2 L O2) Presented to ED with shortness of breath and wheezing, received epinephrine and Solu-Medrol via EMS.  Reported worsening shortness of breath since her recent hospital discharge.  Also reported coughing, O2 sats were in the 80s on her pulse ox despite 5 L O2 at home. CT angiogram of the chest negative for PE however showed marked severity patchy bilateral infiltrates, markedly increased in severity. 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 BOOP. Patient had presented with worsening shortness of breath, hypoxia.  CT angiogram of the chest was negative for PE however showed marked worsening bilateral infiltrates.  Patient improving clinically slowly.  Was on 10 L high flow O2 and prior to that was on 60 L O2 in the ED.  Patient currently on 4 L nasal cannula with sats of 95%.  Patient status post IV remdesivir during previous hospitalization.  Antibiotics were discontinued on 09/14/2019 with a negative procalcitonin.  Patient was followed by PCCM no role for antibiotics.  Currently on IV steroids which we will continue for now and will likely be a slow taper.  Continue vitamin C, zinc, albuterol inhaler.  Patient on Lasix 20 mg IV  daily with a urine output of 1 L over the past 24 hours.  Patient is - 8.283 L during this hospitalization.  Transitioned from IV heparin to Eliquis.  Patient started on prophylactic dose of Bactrim 3 times weekly per recommendations from PCCM to decrease risk of PCP as patient on IV steroids and will likely require a very slow taper of steroids.  IV Solu-Medrol has been decreased to daily.  Would likely transition to oral prednisone taper in the next 1 to 2 days if patient remains stable clinically with stability or improvement of hypoxia.  Will need outpatient follow-up with pulmonary.  Follow.  2.  Bilateral lower extremity DVT Likely secondary to COVID-19, hypercoagulable state.  D-dimer noted to be elevated on admission at 6.71.  CT angiogram chest negative for PE.  Lower extremity Dopplers positive for bilateral DVT.  Patient currently on IV heparin.  It is noted that patient has no insurance.  Case manager consulted for medication assistance or from community wellness center to determine whether patient may be able to go home on a DOAC versus full dose Lovenox.  Per pulmonary patient will need to be treated for a minimum of 3 months for provoked DVT.  Patient has been transitioned from IV heparin to Eliquis. Patient will receive a 30-day coupon.  Patient did state to case management that if a prescription is written her PCP might be able to help her get further refills on medication.  Outpatient follow-up.  3.  Anxiety Stable.  Continue sertraline and Klonopin 1 mg twice daily.   4.  Mildly elevated troponin/demand ischemia EKG  did show T wave inversions, slight ST depressions in anterior lateral leads likely due to problem #1.  Patient asymptomatic.  2D echo from 09/14/2019 with a EF of 60 to 65%, normal LV and right ventricular function.  Follow.  5.  Obesity    DVT prophylaxis: Heparin>>>> Eliquis Code Status: Full Family Communication: Updated patient.  No family at bedside. Disposition  Plan:   Patient came from: Home             Anticipated d/c place: Home  Barriers to d/c OR conditions which need to be met to effect a safe d/c: Home once acute respiratory failure has improved, patient transitioned to oral steroid taper, and off of IV Lasix.   Consultants:   PCCM Dr. Chase Caller 09/14/2019    Procedures:   CT angiogram chest 09/13/2019  2D echo 09/14/2019  Lower extremity Dopplers 09/14/2019  Antimicrobials:  IV cefepime 09/13/2019>>>> 09/14/2019  IV vancomycin 3/26/ 2021x1 dose.  Oral Bactrim 3 times weekly 09/20/2019   Subjective: Patient sitting up in chair.  States shortness of breath is improving daily.  States able to ambulate back and forth to the bathroom without significant shortness of breath.  States weakness is improving.  Denies any chest pain.  No lower extremity pain.  No bleeding.    Objective: Vitals:   09/20/19 2131 09/21/19 0504 09/21/19 0749 09/21/19 1248  BP: (!) 141/90 119/79  112/90  Pulse: 87 96  95  Resp: 18 18    Temp: 98.9 F (37.2 C) 98.4 F (36.9 C)  98.3 F (36.8 C)  TempSrc: Oral Oral  Oral  SpO2: 94% 95% 97% 95%  Weight:      Height:        Intake/Output Summary (Last 24 hours) at 09/21/2019 1259 Last data filed at 09/21/2019 1252 Gross per 24 hour  Intake 480 ml  Output 1350 ml  Net -870 ml   Filed Weights   09/13/19 1415  Weight: 99 kg    Examination:  General exam: NAD. Respiratory system: Fair air movement.  No crackles.  No wheezing.  Decreased breath sounds in the bases.  Speaking in full sentences.  No use of accessory muscles of respiration.  Cardiovascular system: Regular rate rhythm no murmurs rubs or gallops.  No JVD.  No lower extremity edema. Gastrointestinal system: Abdomen is soft, nontender, nondistended, positive bowel sounds.  No rebound.  No guarding.  Central nervous system: Alert and oriented. No focal neurological deficits. Extremities: Symmetric 5 x 5 power. Skin: No rashes, lesions or  ulcers Psychiatry: Judgement and insight appear normal. Mood & affect appropriate.     Data Reviewed: I have personally reviewed following labs and imaging studies  CBC: Recent Labs  Lab 09/17/19 0311 09/18/19 0527 09/19/19 0515 09/20/19 0446 09/21/19 0825  WBC 12.5* 13.5* 15.2* 16.1* 17.8*  NEUTROABS  --   --   --  13.7* 14.1*  HGB 13.2 12.9 12.8 13.4 13.9  HCT 40.4 39.0 38.7 40.5 42.2  MCV 99.3 99.2 99.7 100.0 99.5  PLT 241 198 196 190 572   Basic Metabolic Panel: Recent Labs  Lab 09/17/19 0311 09/18/19 0527 09/19/19 0515 09/20/19 0446 09/21/19 0825  NA 136 136 135 138 130*  K 4.2 3.9 4.0 4.6 4.6  CL 101 101 102 101 95*  CO2 _0 GLUCOSE 145* 118* 127* 132* 103*  BUN 29* 29* 28* 28* 28*  CREATININE 0.66 0.64 0.60 0.64 0.56  CALCIUM 9.1 9.3 9.3 9.6 9.4  MG  --   --   --  2.5*  --   PHOS  --   --   --  3.6  --    GFR: Estimated Creatinine Clearance: 81.2 mL/min (by C-G formula based on SCr of 0.56 mg/dL). Liver Function Tests: No results for input(s): AST, ALT, ALKPHOS, BILITOT, PROT, ALBUMIN in the last 168 hours. No results for input(s): LIPASE, AMYLASE in the last 168 hours. No results for input(s): AMMONIA in the last 168 hours. Coagulation Profile: Recent Labs  Lab 09/14/19 1404  INR 1.1   Cardiac Enzymes: Recent Labs  Lab 09/14/19 1445  CKTOTAL 129  CKMB 3.9   BNP (last 3 results) No results for input(s): PROBNP in the last 8760 hours. HbA1C: No results for input(s): HGBA1C in the last 72 hours. CBG: Recent Labs  Lab 09/15/19 0019 09/15/19 0337 09/15/19 0737 09/15/19 1155 09/15/19 1717  GLUCAP 137* 125* 109* 103* 108*   Lipid Profile: No results for input(s): CHOL, HDL, LDLCALC, TRIG, CHOLHDL, LDLDIRECT in the last 72 hours. Thyroid Function Tests: No results for input(s): TSH, T4TOTAL, FREET4, T3FREE, THYROIDAB in the last 72 hours. Anemia Panel: No results for input(s): VITAMINB12, FOLATE, FERRITIN, TIBC, IRON,  RETICCTPCT in the last 72 hours. Sepsis Labs: Recent Labs  Lab 09/14/19 1448  LATICACIDVEN 1.7    Recent Results (from the past 240 hour(s))  Blood Culture (routine x 2)     Status: None   Collection Time: 09/13/19  3:33 PM   Specimen: BLOOD  Result Value Ref Range Status   Specimen Description   Final    BLOOD RIGHT ANTECUBITAL Performed at [facility] [address] [place], [place] [address]    Special Requests   Final    BOTTLES DRAWN AEROBIC AND ANAEROBIC Blood Culture adequate volume Performed at [facility] [address], [place], [place] [address]    Culture   Final    NO GROWTH 5 DAYS Performed at [facility], [place] [address] [place], [place] [address]    Report Status 09/18/2019 FINAL  Final  Respiratory Panel by PCR     Status: None   Collection Time: 09/13/19 10:38 PM   Specimen: Nasopharyngeal Swab; Respiratory  Result Value Ref Range Status   Adenovirus NOT DETECTED NOT DETECTED Final   Coronavirus 229E NOT DETECTED NOT DETECTED Final    Comment: (NOTE) The Coronavirus on the Respiratory Panel, DOES NOT test for the novel  Coronavirus (2019 nCoV)    Coronavirus HKU1 NOT DETECTED NOT DETECTED Final   Coronavirus NL63 NOT DETECTED NOT DETECTED Final   Coronavirus OC43 NOT DETECTED NOT DETECTED Final   Metapneumovirus NOT DETECTED NOT DETECTED Final   Rhinovirus / Enterovirus NOT DETECTED NOT DETECTED Final   Influenza A NOT DETECTED NOT DETECTED Final   Influenza B NOT DETECTED NOT DETECTED Final   Parainfluenza Virus 1 NOT DETECTED NOT DETECTED Final   Parainfluenza Virus 2 NOT DETECTED NOT DETECTED Final   Parainfluenza Virus 3 NOT DETECTED NOT DETECTED Final   Parainfluenza Virus 4 NOT DETECTED NOT DETECTED Final   Respiratory Syncytial Virus NOT DETECTED NOT DETECTED Final   Bordetella pertussis NOT DETECTED NOT DETECTED Final   Chlamydophila pneumoniae NOT DETECTED NOT DETECTED Final   Mycoplasma  pneumoniae NOT DETECTED NOT DETECTED Final    Comment: Performed at [facility], [place] [address] [place], [place] [address]  Respiratory Panel by RT PCR (Flu A&B, Covid) - Nasopharyngeal Swab  Status: Abnormal   Collection Time: 09/13/19 11:30 PM   Specimen: Nasopharyngeal Swab  Result Value Ref Range Status   SARS Coronavirus 2 by RT PCR POSITIVE (A) NEGATIVE Final    Comment: RESULT CALLED TO, READ BACK BY AND VERIFIED WITH: T,COLEY AT 0234 ON 09/14/19 BY A,MOHAMED (NOTE) SARS-CoV-2 target nucleic acids are DETECTED. SARS-CoV-2 RNA is generally detectable in upper respiratory specimens  during the acute phase of infection. Positive results are indicative of the presence of the identified virus, but do not rule out bacterial infection or co-infection with other pathogens not detected by the test. Clinical correlation with patient history and other diagnostic information is necessary to determine patient infection status. The expected result is Negative. Fact Sheet for Patients:  PinkCheek.be Fact Sheet for Healthcare Providers: GravelBags.it This test is not yet approved or cleared by the [place] FDA and  has been authorized for detection and/or diagnosis of SARS-CoV-2 by FDA under an Emergency Use Authorization (EUA).  This EUA will remain in effect (meaning this test can be use d) for the duration of  the COVID-19 declaration under Section 564(b)(1) of the Act, 21 U.S.C. section 360bbb-3(b)(1), unless the authorization is terminated or revoked sooner.    Influenza A by PCR NEGATIVE NEGATIVE Final   Influenza B by PCR NEGATIVE NEGATIVE Final    Comment: (NOTE) The Xpert Xpress SARS-CoV-2/FLU/RSV assay is intended as an aid in  the diagnosis of influenza from Nasopharyngeal swab specimens and  should not be used as a sole basis for treatment. Nasal washings and  aspirates are unacceptable for Xpert Xpress  SARS-CoV-2/FLU/RSV  testing. Fact Sheet for Patients: PinkCheek.be Fact Sheet for Healthcare Providers: GravelBags.it This test is not yet approved or cleared by the [place] FDA and  has been authorized for detection and/or diagnosis of SARS-CoV-2 by  FDA under an Emergency Use Authorization (EUA). This EUA will remain  in effect (meaning this test can be used) for the duration of the  Covid-19 declaration under Section 564(b)(1) of the Act, 21  U.S.C. section 360bbb-3(b)(1), unless the authorization is  terminated or revoked. Performed at [facility], [place] [address] [place], [place] [address]   MRSA PCR Screening     Status: None   Collection Time: 09/14/19  1:34 PM  Result Value Ref Range Status   MRSA by PCR NEGATIVE NEGATIVE Final    Comment:        The GeneXpert MRSA Assay (FDA approved for NASAL specimens only), is one component of a comprehensive MRSA colonization surveillance program. It is not intended to diagnose MRSA infection nor to guide or monitor treatment for MRSA infections. Performed at [facility], [place] [address], [place], [place] [address]          Radiology Studies: No results found.      Scheduled Meds:  apixaban  10 mg Oral BID   Followed by   Derrill Memo ON 09/27/2019] apixaban  5 mg Oral BID   vitamin C  500 mg Oral Daily   chlorhexidine  15 mL Mouth Rinse BID   Chlorhexidine Gluconate Cloth  6 each Topical Daily   clonazePAM  1 mg Oral BID   feeding supplement (ENSURE ENLIVE)  237 mL Oral Q24H   feeding supplement (PRO-STAT SUGAR FREE 64)  30 mL Oral BID   furosemide  20 mg Intravenous Daily   guaiFENesin  1,200 mg Oral BID   loratadine  10 mg Oral Daily   mouth rinse  15 mL Mouth Rinse q12n4p   methylPREDNISolone (SOLU-MEDROL) injection  40 mg Intravenous Daily   mometasone-formoterol  2 puff Inhalation BID    multivitamin with minerals  1 tablet Oral Daily   sertraline  100 mg Oral Daily   sodium chloride flush  10-40 mL Intracatheter Q12H   sulfamethoxazole-trimethoprim  1 tablet Oral Once per day on Mon Wed Fri   zinc sulfate  220 mg Oral Daily   Continuous Infusions:    LOS: 8 days    Time spent: 35 minutes    Irine Seal, MD [facility]   To contact the attending provider between 7A-7P or the covering provider during after hours 7P-7A, please log into the web site [email or web address] and access using universal [facility] password for that web site. If you do not have the password, please call the hospital operator.  09/21/2019, 12:59 PM

## 2019-09-22 LAB — CBC WITH DIFFERENTIAL/PLATELET
Abs Immature Granulocytes: 0.46 10*3/uL — ABNORMAL HIGH (ref 0.00–0.07)
Basophils Absolute: 0.1 10*3/uL (ref 0.0–0.1)
Basophils Relative: 1 %
Eosinophils Absolute: 0 10*3/uL (ref 0.0–0.5)
Eosinophils Relative: 0 %
HCT: 41.9 % (ref 36.0–46.0)
Hemoglobin: 13.5 g/dL (ref 12.0–15.0)
Immature Granulocytes: 3 %
Lymphocytes Relative: 13 %
Lymphs Abs: 2 10*3/uL (ref 0.7–4.0)
MCH: 32.4 pg (ref 26.0–34.0)
MCHC: 32.2 g/dL (ref 30.0–36.0)
MCV: 100.5 fL — ABNORMAL HIGH (ref 80.0–100.0)
Monocytes Absolute: 1 10*3/uL (ref 0.1–1.0)
Monocytes Relative: 7 %
Neutro Abs: 11.8 10*3/uL — ABNORMAL HIGH (ref 1.7–7.7)
Neutrophils Relative %: 76 %
Platelets: 223 10*3/uL (ref 150–400)
RBC: 4.17 MIL/uL (ref 3.87–5.11)
RDW: 14 % (ref 11.5–15.5)
WBC: 15.3 10*3/uL — ABNORMAL HIGH (ref 4.0–10.5)
nRBC: 0.3 % — ABNORMAL HIGH (ref 0.0–0.2)

## 2019-09-22 LAB — BASIC METABOLIC PANEL
Anion gap: 10 (ref 5–15)
BUN: 34 mg/dL — ABNORMAL HIGH (ref 8–23)
CO2: 27 mmol/L (ref 22–32)
Calcium: 10.1 mg/dL (ref 8.9–10.3)
Chloride: 96 mmol/L — ABNORMAL LOW (ref 98–111)
Creatinine, Ser: 0.62 mg/dL (ref 0.44–1.00)
GFR calc Af Amer: >60 mL/min (ref >60)
GFR calc non Af Amer: >60 mL/min (ref >60)
Glucose, Bld: 115 mg/dL — ABNORMAL HIGH (ref 70–99)
Potassium: 4.3 mmol/L (ref 3.5–5.1)
Sodium: 133 mmol/L — ABNORMAL LOW (ref 135–145)

## 2019-09-22 MED ORDER — CALCIUM CARBONATE ANTACID 500 MG PO CHEW
1.0000 | CHEWABLE_TABLET | Freq: Three times a day (TID) | ORAL | Status: DC | PRN
  Administered 2019-09-22 – 2019-09-23 (×5): 200 mg via ORAL
  Filled 2019-09-22 (×5): qty 1

## 2019-09-22 NOTE — Progress Notes (Signed)
PROGRESS NOTE    Kristen Bender  PTW:[id number] DOB: 18-Sep-1955 DOA: 09/13/2019 PCP: Scot Jun, FNP    Brief Narrative: Patient is a 64 year old female with history of anemia, anxiety, GERD, IBS, thyroid disease, recent hospitalization 3/15 to 09/10/2019 for acute hypoxic respiratory failure secondary to acute COVID-19 viral pneumonitis (patient was treated with IV steroids, remdesivir and Actemra, discharged on 2 L O2) Presented to ED with shortness of breath and wheezing, received epinephrine and Solu-Medrol via EMS.  Reported worsening shortness of breath since her recent hospital discharge.  Also reported coughing, O2 sats were in the 80s on her pulse ox despite 5 L O2 at home. CT angiogram of the chest negative for PE however showed marked severity patchy bilateral infiltrates, markedly increased in severity. 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 BOOP. Patient had presented with worsening shortness of breath, hypoxia.  CT angiogram of the chest was negative for PE however showed marked worsening bilateral infiltrates.  Patient improving clinically slowly.  Was on 10 L high flow O2 and prior to that was on 60 L O2 in the ED.  Patient currently on 2-3 L nasal cannula with sats of 93-96%.  Patient status post IV remdesivir during previous hospitalization.  Antibiotics were discontinued on 09/14/2019 with a negative procalcitonin.  Patient was followed by PCCM no role for antibiotics.  Currently on IV steroids which we will continue for now and will likely be a slow taper.  Continue vitamin C, zinc, albuterol inhaler.  Patient on Lasix 20 mg IV  daily with a urine output of 1.9 L over the past 24 hours.  Patient is - 9.323 L during this hospitalization.  Transitioned from IV heparin to Eliquis.  Patient started on prophylactic dose of Bactrim 3 times weekly per recommendations from PCCM to decrease risk of PCP as patient on IV steroids and will likely require a very slow taper of steroids.  IV Solu-Medrol has been decreased to daily.  Would likely transition to oral prednisone taper tomorrow if patient remains stable clinically with stability or improvement of hypoxia.  Will need outpatient follow-up with pulmonary.  Follow.  2.  Bilateral lower extremity DVT Likely secondary to COVID-19, hypercoagulable state.  D-dimer noted to be elevated on admission at 6.71.  CT angiogram chest negative for PE.  Lower extremity Dopplers positive for bilateral DVT.  Patient was initially placed on IV heparin.  It is noted that patient has no insurance.  Case manager consulted for medication assistance or from community wellness center to determine whether patient may be able to go home on a DOAC versus full dose Lovenox.  Per pulmonary patient will need to be treated for a minimum of 3 months for provoked DVT.  Patient has been transitioned from IV heparin to Eliquis. Patient will receive a 30-day coupon.  Patient did state to case management that if a prescription is written her PCP might be able to help her get further refills on medication.  Outpatient follow-up.  3.  Anxiety Sertraline.  Klonopin 1 mg twice daily.  Outpatient follow-up.    4.  Mildly elevated troponin/demand ischemia EKG did show T  wave inversions, slight ST depressions in anterior lateral leads likely due to problem #1.  Patient asymptomatic.  2D echo from 09/14/2019 with a EF of 60 to 65%, normal LV and right ventricular function.  Follow.  5.  Obesity    DVT prophylaxis: Heparin>>>> Eliquis Code Status: Full Family Communication: Updated patient.  No family at bedside. Disposition  Plan:  . Patient came from: Home            . Anticipated d/c place: Home . Barriers to d/c OR conditions which need to be met to effect a safe d/c: Home once acute respiratory failure has improved, patient transitioned to oral steroid taper, and off of IV Lasix.   Consultants:   PCCM Dr. Chase Caller 09/14/2019    Procedures:   CT angiogram chest 09/13/2019  2D echo 09/14/2019  Lower extremity Dopplers 09/14/2019  Antimicrobials:  IV cefepime 09/13/2019>>>> 09/14/2019  IV vancomycin 3/26/ 2021x1 dose.  Oral Bactrim 3 times weekly 09/20/2019   Subjective: Patient sitting up in chair.  Denies any chest pain.  Shortness of breath improving daily.  Strength improving.  Denies chest pain.  Denies any bleeding.  Very appreciative of care she is receiving.  Objective: Vitals:   09/21/19 1446 09/21/19 2043 09/22/19 0500 09/22/19 0951  BP:  138/88 123/88   Pulse:  91 87   Resp:  (!) 21    Temp:  98.7 F (37.1 C) 98.7 F (37.1 C)   TempSrc:  Oral Oral   SpO2: 96% 94% 96% 93%  Weight:      Height:        Intake/Output Summary (Last 24 hours) at 09/22/2019 1206 Last data filed at 09/22/2019 0911 Gross per 24 hour  Intake --  Output 2100 ml  Net -2100 ml   Filed Weights   09/13/19 1415  Weight: 99 kg    Examination:  General exam: No acute distress. Respiratory system: Some scattered coarse breath sounds/crackles.  Fair air movement.  No wheezing.  Speaking in full sentences.  No use of accessory muscles of respiration.  Cardiovascular system: RRR no murmurs rubs or gallops.  No JVD.  No lower extremity edema.  Gastrointestinal system: Abdomen is nontender, nondistended, soft, positive bowel sounds.  No rebound.  No guarding.  Central nervous system: Alert and oriented. No focal neurological deficits. Extremities: Symmetric 5 x 5 power. Skin: No rashes, lesions or ulcers Psychiatry: Judgement and insight appear normal. Mood & affect appropriate.     Data Reviewed: I have  personally reviewed following labs and imaging studies  CBC: Recent Labs  Lab 09/18/19 0527 09/19/19 0515 09/20/19 0446 09/21/19 0825 09/22/19 0420  WBC 13.5* 15.2* 16.1* 17.8* 15.3*  NEUTROABS  --   --  13.7* 14.1* 11.8*  HGB 12.9 12.8 13.4 13.9 13.5  HCT 39.0 38.7 40.5 42.2 41.9  MCV 99.2 99.7 100.0 99.5 100.5*  PLT 198 196 190 228 481   Basic Metabolic Panel: Recent Labs  Lab 09/18/19 0527 09/19/19 0515 09/20/19 0446 09/21/19 0825 09/22/19 0420  NA 136 135 138 130* 133*  K 3.9 4.0 4.6 4.6 4.3  CL 101 102 101 95* 96*  CO2 _0 GLUCOSE 118* 127* 132* 103* 115*  BUN 29* 28* 28* 28* 34*  CREATININE 0.64 0.60 0.64 0.56 0.62  CALCIUM 9.3 9.3 9.6 9.4 10.1  MG  --   --  2.5*  --   --   PHOS  --   --  3.6  --   --  GFR: Estimated Creatinine Clearance: 81.2 mL/min (by C-G formula based on SCr of 0.62 mg/dL). Liver Function Tests: No results for input(s): AST, ALT, ALKPHOS, BILITOT, PROT, ALBUMIN in the last 168 hours. No results for input(s): LIPASE, AMYLASE in the last 168 hours. No results for input(s): AMMONIA in the last 168 hours. Coagulation Profile: No results for input(s): INR, PROTIME in the last 168 hours. Cardiac Enzymes: No results for input(s): CKTOTAL, CKMB, CKMBINDEX, TROPONINI in the last 168 hours. BNP (last 3 results) No results for input(s): PROBNP in the last 8760 hours. HbA1C: No results for input(s): HGBA1C in the last 72 hours. CBG: Recent Labs  Lab 09/15/19 1717  GLUCAP 108*   Lipid Profile: No results for input(s): CHOL, HDL, LDLCALC, TRIG, CHOLHDL, LDLDIRECT in the last 72 hours. Thyroid Function Tests: No results for input(s): TSH, T4TOTAL, FREET4, T3FREE, THYROIDAB in the last 72 hours. Anemia Panel: No results for input(s): VITAMINB12, FOLATE, FERRITIN, TIBC, IRON, RETICCTPCT in the last 72 hours. Sepsis Labs: No results for input(s): PROCALCITON, LATICACIDVEN in the last 168 hours.  Recent Results (from the past 240  hour(s))  Blood Culture (routine x 2)     Status: None   Collection Time: 09/13/19  3:33 PM   Specimen: BLOOD  Result Value Ref Range Status   Specimen Description   Final    BLOOD RIGHT ANTECUBITAL Performed at [facility] [address] [place], [place] [address]    Special Requests   Final    BOTTLES DRAWN AEROBIC AND ANAEROBIC Blood Culture adequate volume Performed at [facility] [address], [place], [place] [address]    Culture   Final    NO GROWTH 5 DAYS Performed at [facility], [place] [address], [place], [place] [address]    Report Status 09/18/2019 FINAL  Final  Respiratory Panel by PCR     Status: None   Collection Time: 09/13/19 10:38 PM   Specimen: Nasopharyngeal Swab; Respiratory  Result Value Ref Range Status   Adenovirus NOT DETECTED NOT DETECTED Final   Coronavirus 229E NOT DETECTED NOT DETECTED Final    Comment: (NOTE) The Coronavirus on the Respiratory Panel, DOES NOT test for the novel  Coronavirus (2019 nCoV)    Coronavirus HKU1 NOT DETECTED NOT DETECTED Final   Coronavirus NL63 NOT DETECTED NOT DETECTED Final   Coronavirus OC43 NOT DETECTED NOT DETECTED Final   Metapneumovirus NOT DETECTED NOT DETECTED Final   Rhinovirus / Enterovirus NOT DETECTED NOT DETECTED Final   Influenza A NOT DETECTED NOT DETECTED Final   Influenza B NOT DETECTED NOT DETECTED Final   Parainfluenza Virus 1 NOT DETECTED NOT DETECTED Final   Parainfluenza Virus 2 NOT DETECTED NOT DETECTED Final   Parainfluenza Virus 3 NOT DETECTED NOT DETECTED Final   Parainfluenza Virus 4 NOT DETECTED NOT DETECTED Final   Respiratory Syncytial Virus NOT DETECTED NOT DETECTED Final   Bordetella pertussis NOT DETECTED NOT DETECTED Final   Chlamydophila pneumoniae NOT DETECTED NOT DETECTED Final   Mycoplasma pneumoniae NOT DETECTED NOT DETECTED Final    Comment: Performed at [facility], [place] [address] [place], [place] [address]    Respiratory Panel by RT PCR (Flu A&B, Covid) - Nasopharyngeal Swab     Status: Abnormal   Collection Time: 09/13/19 11:30 PM   Specimen: Nasopharyngeal Swab  Result Value Ref Range Status   SARS Coronavirus 2 by RT PCR POSITIVE (A) NEGATIVE Final    Comment: RESULT CALLED TO,  READ BACK BY AND VERIFIED WITH: T,COLEY AT 1660 ON 09/14/19 BY A,MOHAMED (NOTE) SARS-CoV-2 target nucleic acids are DETECTED. SARS-CoV-2 RNA is generally detectable in upper respiratory specimens  during the acute phase of infection. Positive results are indicative of the presence of the identified virus, but do not rule out bacterial infection or co-infection with other pathogens not detected by the test. Clinical correlation with patient history and other diagnostic information is necessary to determine patient infection status. The expected result is Negative. Fact Sheet for Patients:  PinkCheek.be Fact Sheet for Healthcare Providers: GravelBags.it This test is not yet approved or cleared by the [place] FDA and  has been authorized for detection and/or diagnosis of SARS-CoV-2 by FDA under an Emergency Use Authorization (EUA).  This EUA will remain in effect (meaning this test can be use d) for the duration of  the COVID-19 declaration under Section 564(b)(1) of the Act, 21 U.S.C. section 360bbb-3(b)(1), unless the authorization is terminated or revoked sooner.    Influenza A by PCR NEGATIVE NEGATIVE Final   Influenza B by PCR NEGATIVE NEGATIVE Final    Comment: (NOTE) The Xpert Xpress SARS-CoV-2/FLU/RSV assay is intended as an aid in  the diagnosis of influenza from Nasopharyngeal swab specimens and  should not be used as a sole basis for treatment. Nasal washings and  aspirates are unacceptable for Xpert Xpress SARS-CoV-2/FLU/RSV  testing. Fact Sheet for Patients: PinkCheek.be Fact Sheet for Healthcare  Providers: GravelBags.it This test is not yet approved or cleared by the [place] FDA and  has been authorized for detection and/or diagnosis of SARS-CoV-2 by  FDA under an Emergency Use Authorization (EUA). This EUA will remain  in effect (meaning this test can be used) for the duration of the  Covid-19 declaration under Section 564(b)(1) of the Act, 21  U.S.C. section 360bbb-3(b)(1), unless the authorization is  terminated or revoked. Performed at [facility], [place] [address], [place], [place] [address]   MRSA PCR Screening     Status: None   Collection Time: 09/14/19  1:34 PM  Result Value Ref Range Status   MRSA by PCR NEGATIVE NEGATIVE Final    Comment:        The GeneXpert MRSA Assay (FDA approved for NASAL specimens only), is one component of a comprehensive MRSA colonization surveillance program. It is not intended to diagnose MRSA infection nor to guide or monitor treatment for MRSA infections. Performed at [facility], [place] [address], [place], [place] [address]          Radiology Studies: No results found.      Scheduled Meds: . apixaban  10 mg Oral BID   Followed by  . [START ON 09/27/2019] apixaban  5 mg Oral BID  . vitamin C  500 mg Oral Daily  . chlorhexidine  15 mL Mouth Rinse BID  . Chlorhexidine Gluconate Cloth  6 each Topical Daily  . clonazePAM  1 mg Oral BID  . feeding supplement (ENSURE ENLIVE)  237 mL Oral Q24H  . feeding supplement (PRO-STAT SUGAR FREE 64)  30 mL Oral BID  . furosemide  20 mg Intravenous Daily  . guaiFENesin  1,200 mg Oral BID  . loratadine  10 mg Oral Daily  . mouth rinse  15 mL Mouth Rinse q12n4p  . methylPREDNISolone (SOLU-MEDROL) injection  40 mg Intravenous Daily  . mometasone-formoterol  2 puff Inhalation BID  . multivitamin with minerals  1 tablet Oral Daily  . sertraline  100  mg Oral Daily  . sodium chloride flush  10-40 mL Intracatheter Q12H   . sulfamethoxazole-trimethoprim  1 tablet Oral Once per day on Mon Wed Fri  . zinc sulfate  220 mg Oral Daily   Continuous Infusions:    LOS: 9 days    Time spent: 35 minutes    Irine Seal, MD [facility]   To contact the attending provider between 7A-7P or the covering provider during after hours 7P-7A, please log into the web site [email or web address] and access using universal [place] password for that web site. If you do not have the password, please call the hospital operator.  09/22/2019, 12:06 PM

## 2019-09-23 LAB — CBC
HCT: 40.7 % (ref 36.0–46.0)
Hemoglobin: 13.3 g/dL (ref 12.0–15.0)
MCH: 32.8 pg (ref 26.0–34.0)
MCHC: 32.7 g/dL (ref 30.0–36.0)
MCV: 100.5 fL — ABNORMAL HIGH (ref 80.0–100.0)
Platelets: 205 10*3/uL (ref 150–400)
RBC: 4.05 MIL/uL (ref 3.87–5.11)
RDW: 14 % (ref 11.5–15.5)
WBC: 14.5 10*3/uL — ABNORMAL HIGH (ref 4.0–10.5)
nRBC: 0.1 % (ref 0.0–0.2)

## 2019-09-23 LAB — BASIC METABOLIC PANEL
Anion gap: 12 (ref 5–15)
BUN: 34 mg/dL — ABNORMAL HIGH (ref 8–23)
CO2: 26 mmol/L (ref 22–32)
Calcium: 9.7 mg/dL (ref 8.9–10.3)
Chloride: 97 mmol/L — ABNORMAL LOW (ref 98–111)
Creatinine, Ser: 0.55 mg/dL (ref 0.44–1.00)
GFR calc Af Amer: >60 mL/min (ref >60)
GFR calc non Af Amer: >60 mL/min (ref >60)
Glucose, Bld: 126 mg/dL — ABNORMAL HIGH (ref 70–99)
Potassium: 4.2 mmol/L (ref 3.5–5.1)
Sodium: 135 mmol/L (ref 135–145)

## 2019-09-23 LAB — MAGNESIUM: Magnesium: 2.2 mg/dL (ref 1.7–2.4)

## 2019-09-23 MED ORDER — PREDNISONE 50 MG PO TABS
60.0000 mg | ORAL_TABLET | Freq: Every day | ORAL | Status: DC
  Administered 2019-09-24: 60 mg via ORAL
  Filled 2019-09-23: qty 1

## 2019-09-23 NOTE — TOC Progression Note (Signed)
Transition of Care [facility]) - Progression Note    Patient Details  Name: Kristen Bender MRN: [id number] Date of Birth: 1955-07-16  [facility] CM/SW Contact  Geni Bers, RN Phone Number: 09/23/2019, 11:17 AM  Clinical Narrative:     Pt is with [facility] for HHPT only!!  Expected Discharge Plan: Home/Self Care Barriers to Discharge: No Barriers Identified  Expected Discharge Plan and Services Expected Discharge Plan: Home/Self Care       Living arrangements for the past 2 months: Single Family Home                                       Social Determinants of Health (SDOH) Interventions    Readmission Risk Interventions No flowsheet data found.

## 2019-09-23 NOTE — Telephone Encounter (Signed)
appt scheduled for 10/28/19 at 10 am with Dr Chestine Spore

## 2019-09-23 NOTE — Progress Notes (Signed)
Physical Therapy Treatment Patient Details Name: Kristen Bender MRN: [id number] DOB: Dec 28, 1955 Today's Date: 09/23/2019    History of Present Illness 64 year old female with history of anemia, anxiety, GERD, IBS, thyroid disease, recent hospitalization 3/15 to 09/10/2019 for acute hypoxic respiratory failure secondary to acute COVID-19 viral pneumonitis. pt returned to [facility] 09/13/19 with shortness of breath and wheezing, SpO2 in 80s on 5L HFNC. CT negative for PE however with patchy infiltrates markedly incr severity, admitted with pna d/t Covid-19    PT Comments    Progressing with mobility. O2 87% on RA, 92% on 2L with activity on today. Will continue to follow.     Follow Up Recommendations  Home health PT     Equipment Recommendations  None recommended by PT    Recommendations for Other Services       Precautions / Restrictions Precautions Precautions: Fall Precaution Comments: monitor O2; airborne Restrictions Weight Bearing Restrictions: No    Mobility  Bed Mobility               General bed mobility comments: oob in recliner  Transfers Overall transfer level: Needs assistance Equipment used: Rolling walker (2 wheeled) Transfers: Sit to/from Stand Sit to Stand: Supervision         General transfer comment: for safety  Ambulation/Gait Ambulation/Gait assistance: Min guard Gait Distance (Feet): 60 Feet(x2) Assistive device: Rolling walker (2 wheeled) Gait Pattern/deviations: Step-through pattern;Decreased stride length     General Gait Details: close guard for safety. walked x 1 on RA-sats 87%. then walked x 1 on 2L O2-sats 92%. dyspnea 2/4. pt fatigues fairly easily   Stairs             Wheelchair Mobility    Modified Rankin (Stroke Patients Only)       Balance Overall balance assessment: Needs assistance         Standing balance support: Bilateral upper extremity supported Standing balance-Leahy Scale: Fair                               Cognition Arousal/Alertness: Awake/alert Behavior During Therapy: WFL for tasks assessed/performed Overall Cognitive Status: Within Functional Limits for tasks assessed                                        Exercises      General Comments        Pertinent Vitals/Pain Pain Assessment: Faces Faces Pain Scale: Hurts little more Pain Location: R midl/upper flank Pain Descriptors / Indicators: Discomfort;Sore;Tightness Pain Intervention(s): Monitored during session;Repositioned    Home Living                      Prior Function            PT Goals (current goals can now be found in the care plan section) Progress towards PT goals: Progressing toward goals    Frequency    Min 3X/week      PT Plan Current plan remains appropriate    Co-evaluation              AM-PAC PT "6 Clicks" Mobility   Outcome Measure  Help needed turning from your back to your side while in a flat bed without using bedrails?: None Help needed moving from lying on your back to sitting on the side of a flat  bed without using bedrails?: None Help needed moving to and from a bed to a chair (including a wheelchair)?: A Little Help needed standing up from a chair using your arms (e.g., wheelchair or bedside chair)?: A Little Help needed to walk in hospital room?: A Little Help needed climbing 3-5 steps with a railing? : A Little 6 Click Score: 20    End of Session Equipment Utilized During Treatment: Oxygen Activity Tolerance: Patient tolerated treatment well Patient left: in chair;with call bell/phone within reach   PT Visit Diagnosis: Difficulty in walking, not elsewhere classified (R26.2)     Time: 9758-8325 PT Time Calculation (min) (ACUTE ONLY): 18 min  Charges:  $Gait Training: 8-22 mins                         Faye Ramsay, PT Acute Rehabilitation

## 2019-09-23 NOTE — Progress Notes (Signed)
PROGRESS NOTE    Kristen Bender  VPX:[id number] DOB: March 04, 1956 DOA: 09/13/2019 PCP: Scot Jun, FNP    Brief Narrative: Patient is a 64 year old female with history of anemia, anxiety, GERD, IBS, thyroid disease, recent hospitalization 3/15 to 09/10/2019 for acute hypoxic respiratory failure secondary to acute COVID-19 viral pneumonitis (patient was treated with IV steroids, remdesivir and Actemra, discharged on 2 L O2) Presented to ED with shortness of breath and wheezing, received epinephrine and Solu-Medrol via EMS.  Reported worsening shortness of breath since her recent hospital discharge.  Also reported coughing, O2 sats were in the 80s on her pulse ox despite 5 L O2 at home. CT angiogram of the chest negative for PE however showed marked severity patchy bilateral infiltrates, markedly increased in severity. 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 BOOP. Patient had presented with worsening shortness of breath, hypoxia.  CT angiogram of the chest was negative for PE however showed marked worsening bilateral infiltrates.  Patient improving clinically slowly.  Was on 10 L high flow O2 and prior to that was on 60 L O2 in the ED.  Patient currently on 2-3 L nasal cannula with sats of 91-95%.  Patient status post IV remdesivir during previous hospitalization.  Antibiotics were discontinued on 09/14/2019 with a negative procalcitonin.  Patient was followed by PCCM no role for antibiotics.  Currently on IV steroids which we will transition to oral slow prednisone taper tomorrow.  Continue vitamin C, zinc, albuterol inhaler.  Patient on Lasix 20 mg  IV daily with a urine output of 1.9 L over the past 24 hours.  Patient is -10.293 L during this hospitalization.  Transitioned from IV heparin to Eliquis.  Patient started on prophylactic dose of Bactrim 3 times weekly per recommendations from PCCM to decrease risk of PCP as patient on IV steroids and will likely require a very slow taper of steroids.  Would likely transition to oral prednisone taper tomorrow if patient remains stable clinically with stability or improvement of hypoxia.  Will need outpatient follow-up with pulmonary.  Follow.  2.  Bilateral lower extremity DVT Likely secondary to COVID-19, hypercoagulable state.  D-dimer noted to be elevated on admission at 6.71.  CT angiogram chest negative for PE.  Lower extremity Dopplers positive for bilateral DVT.  Patient was initially placed on IV heparin.  It is noted that patient has no insurance.  Case manager consulted for medication assistance or from community wellness center to determine whether patient may be able to go home on a DOAC versus full dose Lovenox.  Per pulmonary patient will need to be treated for a minimum of 3 months for provoked DVT.  Patient has been transitioned from IV heparin to Eliquis. Patient will receive a 30-day coupon.  Patient did state to case management that if a prescription is written her PCP might be able to help her get further refills on medication.  Outpatient follow-up.  3.  Anxiety Sertraline.  Klonopin 1 mg twice daily.  Outpatient follow-up.    4.  Mildly elevated troponin/demand ischemia EKG did show T wave inversions, slight ST depressions in anterior lateral leads likely due to  problem #1.  Patient asymptomatic.  2D echo from 09/14/2019 with a EF of 60 to 65%, normal LV and right ventricular function.  Follow.  5.  Obesity    DVT prophylaxis: Heparin>>>> Eliquis Code Status: Full Family Communication: Updated patient.  Updated daughter [name] via FaceTime on patient's phone.  Disposition  Plan:  . Patient came from: Home            . Anticipated d/c place: Home . Barriers to d/c OR conditions which need to be met to effect a safe d/c: Home once acute respiratory failure has improved, patient transitioned to oral steroid taper, and off of IV Lasix hopefully in the next 24 hours.   Consultants:   PCCM Dr. Chase Caller 09/14/2019    Procedures:   CT angiogram chest 09/13/2019  2D echo 09/14/2019  Lower extremity Dopplers 09/14/2019  Antimicrobials:  IV cefepime 09/13/2019>>>> 09/14/2019  IV vancomycin 3/26/ 2021x1 dose.  Oral Bactrim 3 times weekly 09/20/2019   Subjective: Patient sitting up in chair.  States she feels better and better every day.  Stated was able to take a shower without significant shortness of breath last night.  Denies any chest pain.  Denies any bleeding.  Very appreciative of the care she is receiving during this hospitalization.    Objective: Vitals:   09/21/19 2043 09/22/19 0500 09/22/19 0951 09/23/19 0507  BP: 138/88 123/88  111/81  Pulse: 91 87  82  Resp: (!) 21   20  Temp: 98.7 F (37.1 C) 98.7 F (37.1 C)  98.2 F (36.8 C)  TempSrc: Oral Oral  Oral  SpO2: 94% 96% 93% 91%  Weight:      Height:        Intake/Output Summary (Last 24 hours) at 09/23/2019 1228 Last data filed at 09/23/2019 0930 Gross per 24 hour  Intake 1030 ml  Output 1400 ml  Net -370 ml   Filed Weights   09/13/19 1415  Weight: 99 kg    Examination:  General exam: NAD. Respiratory system: Decreased breath sounds in the bases.  Coarse breath sounds improving.  Fair air movement.  No wheezing.  Speaking in full sentences.  No use of accessory muscles of respiration.   Cardiovascular system: Regular rate rhythm no murmurs rubs or gallops.  No JVD.  No lower extremity edema.   Gastrointestinal system: Abdomen is soft, nontender, nondistended, positive bowel sounds.  No rebound.  No guarding.   Central nervous system: Alert and oriented. No focal neurological  deficits. Extremities: Symmetric 5 x 5 power. Skin: No rashes, lesions or ulcers Psychiatry: Judgement and insight appear normal. Mood & affect appropriate.     Data Reviewed: I have personally reviewed following labs and imaging studies  CBC: Recent Labs  Lab 09/19/19 0515 09/20/19 0446 09/21/19 0825 09/22/19 0420 09/23/19 0435  WBC 15.2* 16.1* 17.8* 15.3* 14.5*  NEUTROABS  --  13.7* 14.1* 11.8*  --   HGB 12.8 13.4 13.9 13.5 13.3  HCT 38.7 40.5 42.2 41.9 40.7  MCV 99.7 100.0 99.5 100.5* 100.5*  PLT 196 190 228 223 324   Basic Metabolic Panel: Recent Labs  Lab 09/19/19 0515 09/20/19 0446 09/21/19 0825 09/22/19 0420 09/23/19 0435  NA 135 138 130* 133* 135  K 4.0 4.6 4.6 4.3 4.2  CL 102 101 95* 96* 97*  CO2 _0 GLUCOSE 127* 132* 103* 115* 126*  BUN 28* 28* 28* 34* 34*  CREATININE 0.60 0.64 0.56 0.62 0.55  CALCIUM 9.3 9.6  9.4 10.1 9.7  MG  --  2.5*  --   --  2.2  PHOS  --  3.6  --   --   --    GFR: Estimated Creatinine Clearance: 81.2 mL/min (by C-G formula based on SCr of 0.55 mg/dL). Liver Function Tests: No results for input(s): AST, ALT, ALKPHOS, BILITOT, PROT, ALBUMIN in the last 168 hours. No results for input(s): LIPASE, AMYLASE in the last 168 hours. No results for input(s): AMMONIA in the last 168 hours. Coagulation Profile: No results for input(s): INR, PROTIME in the last 168 hours. Cardiac Enzymes: No results for input(s): CKTOTAL, CKMB, CKMBINDEX, TROPONINI in the last 168 hours. BNP (last 3 results) No results for input(s): PROBNP in the last 8760 hours. HbA1C: No results for input(s): HGBA1C in the last 72 hours. CBG: No results for input(s): GLUCAP in the last 168 hours. Lipid Profile: No results for input(s): CHOL, HDL, LDLCALC, TRIG, CHOLHDL, LDLDIRECT in the last 72 hours. Thyroid Function Tests: No results for input(s): TSH, T4TOTAL, FREET4, T3FREE, THYROIDAB in the last 72 hours. Anemia Panel: No results for input(s):  VITAMINB12, FOLATE, FERRITIN, TIBC, IRON, RETICCTPCT in the last 72 hours. Sepsis Labs: No results for input(s): PROCALCITON, LATICACIDVEN in the last 168 hours.  Recent Results (from the past 240 hour(s))  Blood Culture (routine x 2)     Status: None   Collection Time: 09/13/19  3:33 PM   Specimen: BLOOD  Result Value Ref Range Status   Specimen Description   Final    BLOOD RIGHT ANTECUBITAL Performed at [facility] [address], [place], [place] [address]    Special Requests   Final    BOTTLES DRAWN AEROBIC AND ANAEROBIC Blood Culture adequate volume Performed at [facility] [address], [place], [place] [address]    Culture   Final    NO GROWTH 5 DAYS Performed at [facility], [place] [address] [place], [place] [address]    Report Status 09/18/2019 FINAL  Final  Respiratory Panel by PCR     Status: None   Collection Time: 09/13/19 10:38 PM   Specimen: Nasopharyngeal Swab; Respiratory  Result Value Ref Range Status   Adenovirus NOT DETECTED NOT DETECTED Final   Coronavirus 229E NOT DETECTED NOT DETECTED Final    Comment: (NOTE) The Coronavirus on the Respiratory Panel, DOES NOT test for the novel  Coronavirus (2019 nCoV)    Coronavirus HKU1 NOT DETECTED NOT DETECTED Final   Coronavirus NL63 NOT DETECTED NOT DETECTED Final   Coronavirus OC43 NOT DETECTED NOT DETECTED Final   Metapneumovirus NOT DETECTED NOT DETECTED Final   Rhinovirus / Enterovirus NOT DETECTED NOT DETECTED Final   Influenza A NOT DETECTED NOT DETECTED Final   Influenza B NOT DETECTED NOT DETECTED Final   Parainfluenza Virus 1 NOT DETECTED NOT DETECTED Final   Parainfluenza Virus 2 NOT DETECTED NOT DETECTED Final   Parainfluenza Virus 3 NOT DETECTED NOT DETECTED Final   Parainfluenza Virus 4 NOT DETECTED NOT DETECTED Final   Respiratory Syncytial Virus NOT DETECTED NOT DETECTED Final   Bordetella pertussis NOT DETECTED NOT DETECTED Final   Chlamydophila  pneumoniae NOT DETECTED NOT DETECTED Final   Mycoplasma pneumoniae NOT DETECTED NOT DETECTED Final    Comment: Performed at [facility], [place] [address] [place],  [address]  Respiratory Panel by RT PCR (Flu A&B, Covid) - Nasopharyngeal Swab     Status: Abnormal   Collection Time: 09/13/19 11:30  PM   Specimen: Nasopharyngeal Swab  Result Value Ref Range Status   SARS Coronavirus 2 by RT PCR POSITIVE (A) NEGATIVE Final    Comment: RESULT CALLED TO, READ BACK BY AND VERIFIED WITH: T,COLEY AT 0234 ON 09/14/19 BY A,MOHAMED (NOTE) SARS-CoV-2 target nucleic acids are DETECTED. SARS-CoV-2 RNA is generally detectable in upper respiratory specimens  during the acute phase of infection. Positive results are indicative of the presence of the identified virus, but do not rule out bacterial infection or co-infection with other pathogens not detected by the test. Clinical correlation with patient history and other diagnostic information is necessary to determine patient infection status. The expected result is Negative. Fact Sheet for Patients:  PinkCheek.be Fact Sheet for Healthcare Providers: GravelBags.it This test is not yet approved or cleared by the [place] FDA and  has been authorized for detection and/or diagnosis of SARS-CoV-2 by FDA under an Emergency Use Authorization (EUA).  This EUA will remain in effect (meaning this test can be use d) for the duration of  the COVID-19 declaration under Section 564(b)(1) of the Act, 21 U.S.C. section 360bbb-3(b)(1), unless the authorization is terminated or revoked sooner.    Influenza A by PCR NEGATIVE NEGATIVE Final   Influenza B by PCR NEGATIVE NEGATIVE Final    Comment: (NOTE) The Xpert Xpress SARS-CoV-2/FLU/RSV assay is intended as an aid in  the diagnosis of influenza from Nasopharyngeal swab specimens and  should not be used as a sole basis for treatment. Nasal  washings and  aspirates are unacceptable for Xpert Xpress SARS-CoV-2/FLU/RSV  testing. Fact Sheet for Patients: PinkCheek.be Fact Sheet for Healthcare Providers: GravelBags.it This test is not yet approved or cleared by the [place] FDA and  has been authorized for detection and/or diagnosis of SARS-CoV-2 by  FDA under an Emergency Use Authorization (EUA). This EUA will remain  in effect (meaning this test can be used) for the duration of the  Covid-19 declaration under Section 564(b)(1) of the Act, 21  U.S.C. section 360bbb-3(b)(1), unless the authorization is  terminated or revoked. Performed at [facility], [place] [address], [place], [place] [address]   MRSA PCR Screening     Status: None   Collection Time: 09/14/19  1:34 PM  Result Value Ref Range Status   MRSA by PCR NEGATIVE NEGATIVE Final    Comment:        The GeneXpert MRSA Assay (FDA approved for NASAL specimens only), is one component of a comprehensive MRSA colonization surveillance program. It is not intended to diagnose MRSA infection nor to guide or monitor treatment for MRSA infections. Performed at [facility], [place] [address], [place], [place] [address]          Radiology Studies: No results found.      Scheduled Meds: . apixaban  10 mg Oral BID   Followed by  . [START ON 09/27/2019] apixaban  5 mg Oral BID  . vitamin C  500 mg Oral Daily  . chlorhexidine  15 mL Mouth Rinse BID  . clonazePAM  1 mg Oral BID  . feeding supplement (ENSURE ENLIVE)  237 mL Oral Q24H  . feeding supplement (PRO-STAT SUGAR FREE 64)  30 mL Oral BID  . furosemide  20 mg Intravenous Daily  . guaiFENesin  1,200 mg Oral BID  . loratadine  10 mg Oral Daily  . mouth rinse  15 mL Mouth Rinse q12n4p  . methylPREDNISolone (SOLU-MEDROL) injection  40 mg Intravenous Daily  .  mometasone-formoterol  2 puff Inhalation BID  .  multivitamin with minerals  1 tablet Oral Daily  . sertraline  100 mg Oral Daily  . sodium chloride flush  10-40 mL Intracatheter Q12H  . sulfamethoxazole-trimethoprim  1 tablet Oral Once per day on Mon Wed Fri  . zinc sulfate  220 mg Oral Daily   Continuous Infusions:    LOS: 10 days    Time spent: 35 minutes    Irine Seal, MD [facility]   To contact the attending provider between 7A-7P or the covering provider during after hours 7P-7A, please log into the web site [email or web address] and access using universal [place] password for that web site. If you do not have the password, please call the hospital operator.  09/23/2019, 12:28 PM

## 2019-09-23 NOTE — Progress Notes (Signed)
ANTICOAGULATION CONSULT NOTE  Pharmacy Consult for heparin>>Eliquis Indication: DVT  No Known Allergies  Patient Measurements: Height: 5\' 4"  (162.6 cm) Weight: 99 kg (218 lb 4.1 oz) IBW/kg (Calculated) : 54.7 Heparin Dosing Weight: 78 kg  Vital Signs: Temp: 98.9 F (37.2 C) (04/05 1249) Temp Source: Oral (04/05 1249) BP: 109/75 (04/05 1249) Pulse Rate: 92 (04/05 1249)  Labs: Recent Labs    09/21/19 0825 09/21/19 0825 09/22/19 0420 09/23/19 0435  HGB 13.9   < > 13.5 13.3  HCT 42.2  --  41.9 40.7  PLT 228  --  223 205  CREATININE 0.56  --  0.62 0.55   < > = values in this interval not displayed.   Estimated Creatinine Clearance: 81.2 mL/min (by C-G formula based on SCr of 0.55 mg/dL).  Assessment: Pharmacy consulted to dose/monitor heparin in this 64 year old female recently hospitalized with COVID PNA. Pt presented to ED with complaints of SOB, venous doppler on 3/27 revealed bilateral acute DVT. CTA 3/26: No evidence of PE. Pt not taking anticoagulants PTA.  Today, 09/23/19    CBC remains WNL, SCr WNL  No bleeding noted  Goal of Therapy:  Heparin level 0.3-0.7 units/ml Monitor platelets by anticoagulation protocol: Yes   Plan:  Continue Eliquis 10 mg po bid through 4/8 then transition to Eliquis 5 mg po bid on 4/9 30 day free card has been provided to patient Monitor for s/sx bleeding.   6/9, PharmD, BCPS 09/23/2019 1:01 PM

## 2019-09-23 NOTE — Progress Notes (Signed)
Nutrition Follow-up  RD working remotely.   DOCUMENTATION CODES:   Obesity unspecified  INTERVENTION:  - continue Ensure Enlive once/day and 30 ml prostat once/day.  - continue to encourage PO intakes. - weigh patient today.    NUTRITION DIAGNOSIS:   Increased nutrient needs related to acute illness, catabolic TGPQDIY(MEBRA-30 infection) as evidenced by estimated needs. -ongoing  GOAL:   Patient will meet greater than or equal to 90% of their needs -met on average   MONITOR:   PO intake, Supplement acceptance, Labs, Weight trends  ASSESSMENT:   64 year old female with history of anemia, anxiety, GERD, IBS, thyroid disease, hospitalization 3/15-3/23 for acute hypoxic respiratory failure 2/2 acute COVID-19 viral pneumonitis (patient was treated with IV steroids, remdesivir and Actemra, discharged on 2L O2). She presented to the ED with SOB, coughing, and wheezing, received epinephrine; reported worsening SOB since discharge. CT chest negative for PE but showed marked severity patchy bilateral infiltrates, markedly increased in severity.  Patient has mainly been consuming 100% at meals over the past 1 week. She has been accepting all bottles of Ensure and all packets of prostat offered to her. She has not been weighed since 3/26 (admission).   Per notes: - acute hypoxic respiratory failure 2/2 COVID-19 PNA--vitamin c, zinc, possible transition to oral steroid today (4/5) - BLE DVT--now on eliquis    Labs reviewed; Cl: 97 mmol/l, BUN: 34 mg/dl. Medications reviewed; 500 mg ascorbic acid/day, 20 mg IV lasix/day, 40 mg solu-medrol/day, daily multivitamin with minerals, 220 mg zinc sulfate/day.    Diet Order:   Diet Order            Diet Heart Room service appropriate? Yes; Fluid consistency: Thin  Diet effective now              EDUCATION NEEDS:   No education needs have been identified at this time  Skin:  Skin Assessment: Reviewed RN Assessment  Last BM:   4/5  Height:   Ht Readings from Last 1 Encounters:  09/13/19 5' 4" (1.626 m)    Weight:   Wt Readings from Last 1 Encounters:  09/13/19 99 kg    Ideal Body Weight:  54.5 kg  BMI:  Body mass index is 37.46 kg/m.  Estimated Nutritional Needs:   Kcal:  2280-2475 kcal  Protein:  115-130 grams  Fluid:  >/= 2.3 L/day     Jarome Matin, MS, RD, LDN, CNSC Inpatient Clinical Dietitian RD pager # available in AMION  After hours/weekend pager # available in [facility]

## 2019-09-24 LAB — CBC
HCT: 40.6 % (ref 36.0–46.0)
Hemoglobin: 13.3 g/dL (ref 12.0–15.0)
MCH: 32.8 pg (ref 26.0–34.0)
MCHC: 32.8 g/dL (ref 30.0–36.0)
MCV: 100.2 fL — ABNORMAL HIGH (ref 80.0–100.0)
Platelets: 198 10*3/uL (ref 150–400)
RBC: 4.05 MIL/uL (ref 3.87–5.11)
RDW: 14 % (ref 11.5–15.5)
WBC: 15 10*3/uL — ABNORMAL HIGH (ref 4.0–10.5)
nRBC: 0.2 % (ref 0.0–0.2)

## 2019-09-24 LAB — BASIC METABOLIC PANEL
Anion gap: 12 (ref 5–15)
BUN: 34 mg/dL — ABNORMAL HIGH (ref 8–23)
CO2: 27 mmol/L (ref 22–32)
Calcium: 9.5 mg/dL (ref 8.9–10.3)
Chloride: 96 mmol/L — ABNORMAL LOW (ref 98–111)
Creatinine, Ser: 0.71 mg/dL (ref 0.44–1.00)
GFR calc Af Amer: >60 mL/min (ref >60)
GFR calc non Af Amer: >60 mL/min (ref >60)
Glucose, Bld: 103 mg/dL — ABNORMAL HIGH (ref 70–99)
Potassium: 4.7 mmol/L (ref 3.5–5.1)
Sodium: 135 mmol/L (ref 135–145)

## 2019-09-24 MED ORDER — SULFAMETHOXAZOLE-TRIMETHOPRIM 800-160 MG PO TABS: 1.0000 | ORAL_TABLET | ORAL | 0 refills | Status: DC

## 2019-09-24 MED ORDER — ASCORBIC ACID 500 MG PO TABS: 500.0000 mg | ORAL_TABLET | Freq: Every day | ORAL | Status: DC

## 2019-09-24 MED ORDER — BENZONATATE 200 MG PO CAPS: 200.0000 mg | ORAL_CAPSULE | Freq: Three times a day (TID) | ORAL | 0 refills | Status: AC | PRN

## 2019-09-24 MED ORDER — PREDNISONE 10 MG PO TABS: 20.0000 mg | ORAL_TABLET | Freq: Every day | ORAL | 0 refills | Status: DC

## 2019-09-24 MED ORDER — MOMETASONE FURO-FORMOTEROL FUM 200-5 MCG/ACT IN AERO: 2.0000 | INHALATION_SPRAY | Freq: Two times a day (BID) | RESPIRATORY_TRACT | 1 refills | Status: AC

## 2019-09-24 MED ORDER — SPIRIVA HANDIHALER 18 MCG IN CAPS: 18.0000 ug | ORAL_CAPSULE | Freq: Every day | RESPIRATORY_TRACT | 2 refills | Status: AC

## 2019-09-24 MED ORDER — APIXABAN 5 MG PO TABS: 10.0000 mg | ORAL_TABLET | Freq: Two times a day (BID) | ORAL | 2 refills | Status: DC

## 2019-09-24 NOTE — Progress Notes (Signed)
Spoke with RN Vinnie Langton via secure chat and made her aware that she can DC the patient's midline

## 2019-09-24 NOTE — Progress Notes (Signed)
Occupational Therapy Treatment Patient Details Name: Kristen Bender MRN: [id number] DOB: 07-Oct-1955 Today's Date: 09/24/2019    History of present illness 64 year old female with history of anemia, anxiety, GERD, IBS, thyroid disease, recent hospitalization 3/15 to 09/10/2019 for acute hypoxic respiratory failure secondary to acute COVID-19 viral pneumonitis. pt returned to [facility] 09/13/19 with shortness of breath and wheezing, SpO2 in 80s on 5L HFNC. CT negative for PE however with patchy infiltrates markedly incr severity, admitted with pna d/t Covid-19   OT comments  Pt has A at home as needed and is motivated to return home  Follow Up Recommendations  No OT follow up;Supervision/Assistance - 24 hour    Equipment Recommendations  None recommended by OT    Recommendations for Other Services      Precautions / Restrictions Precautions Precautions: Fall Precaution Comments: monitor O2; airborne Restrictions Weight Bearing Restrictions: No       Mobility Bed Mobility               General bed mobility comments: in chair  Transfers Overall transfer level: Needs assistance Equipment used: Rolling walker (2 wheeled) Transfers: Sit to/from [facility] Sit to Stand: Supervision Stand pivot transfers: Supervision       General transfer comment: for safety    Balance Overall balance assessment: Needs assistance         Standing balance support: Bilateral upper extremity supported Standing balance-Leahy Scale: Fair                             ADL either performed or assessed with clinical judgement   ADL Overall ADL's : Needs assistance/impaired Eating/Feeding: Sitting;Independent   Grooming: Brushing hair;Standing;Supervision/safety                   Toilet Transfer: Min guard;Ambulation;Comfort height toilet   Toileting- Clothing Manipulation and Hygiene: Supervision/safety;Sit to/from stand       Functional mobility  during ADLs: Min guard;Supervision/safety General ADL Comments: Educated pt on energy conservation as well as BUE exercise. Handout for both issues as well as theraband. Pt states husband is helpful and will A pt at home     Vision Patient Visual Report: No change from baseline     Perception     Praxis      Cognition Arousal/Alertness: Awake/alert Behavior During Therapy: WFL for tasks assessed/performed Overall Cognitive Status: Within Functional Limits for tasks assessed                                                     Pertinent Vitals/ Pain       Pain Assessment: No/denies pain     Prior Functioning/Environment              Frequency  Min 2X/week        Progress Toward Goals  OT Goals(current goals can now be found in the care plan section)  Progress towards OT goals: Progressing toward goals     Plan Discharge plan remains appropriate       AM-PAC OT "6 Clicks" Daily Activity     Outcome Measure   Help from another person eating meals?: None Help from another person taking care of personal grooming?: None Help from another person toileting, which includes using toliet, bedpan, or urinal?: A Little  Help from another person bathing (including washing, rinsing, drying)?: None Help from another person to put on and taking off regular upper body clothing?: A Little Help from another person to put on and taking off regular lower body clothing?: A Little 6 Click Score: 21    End of Session Equipment Utilized During Treatment: Oxygen  OT Visit Diagnosis: Unsteadiness on feet (R26.81)   Activity Tolerance Patient tolerated treatment well   Patient Left in chair;with call bell/phone within reach   Nurse Communication Mobility status           Charges: OT General Charges $OT Visit: 1 Visit OT Treatments $Self Care/Home Management : 8-22 mins  Lise Auer, OT Acute Rehabilitation Services [phone] Office- [phone]      Daelin Haste, Karin Golden D 09/24/2019, 1:53 PM

## 2019-09-24 NOTE — Plan of Care (Signed)
  Problem: Clinical Measurements: Goal: Ability to maintain clinical measurements within normal limits will improve Outcome: Progressing Note: Maintaining O2 sat on 2lnc Goal: Will remain free from infection Outcome: Progressing Note: Pt recovering from COVID 19 infection.  Goal: Diagnostic test results will improve Outcome: Progressing Goal: Respiratory complications will improve Outcome: Progressing   Problem: Activity: Goal: Risk for activity intolerance will decrease Outcome: Progressing

## 2019-09-24 NOTE — TOC Progression Note (Signed)
Transition of Care [facility]) - Progression Note    Patient Details  Name: Kristen Bender MRN: [id number] Date of Birth: 05-22-1956  [facility] CM/SW Contact  Kristen Bers, RN Phone Number: 09/24/2019, 10:48 AM  Clinical Narrative:    Pt asked that her daughter Kristen Bender be called. Spoke with pt's daughter concerning pt's medications, home O2 and HHPT. Daughter states, "she can get charity for [facility] and O2." Daughter asked that pt's medication be called to [facility] on [address].Pt was in hospital 2 week ago, received charity for HHPT only and Home O2. Pt was active for [facility] for HHPT ONLY and Home O2 from [facility]. Pt has Home O2 from [facility].   Expected Discharge Plan: Home/Self Care Barriers to Discharge: No Barriers Identified  Expected Discharge Plan and Services Expected Discharge Plan: Home/Self Care       Living arrangements for the past 2 months: Single Family Home                                       Social Determinants of Health (SDOH) Interventions    Readmission Risk Interventions No flowsheet data found.

## 2019-09-24 NOTE — Progress Notes (Signed)
D/C instructions reviewed w/ pt. Pt verbalizes understanding and all questions answered. Pt in possession of d/c packet, scripts, and all personal belongings. Pt has called husband for ride home. Pt is stable, ambulatory w/ RW, and stand by assist, and eager for d/c home.

## 2019-09-24 NOTE — Discharge Summary (Signed)
Physician Discharge Summary  Kristen Bender WUX:[id number] DOB: 1955-08-24 DOA: 09/13/2019  PCP: Bing Neighbors, FNP  Admit date: 09/13/2019 Discharge date: 09/24/2019  Time spent: 60 minutes  Recommendations for Outpatient Follow-up:  1. Follow-up with Dr. Chestine Spore, pulmonary on 10/28/2019.  On follow-up further titration of patient's prednisone will need to be done as well as decision as to whether patient needs ongoing prophylactic dose of Bactrim.  Patient will need a basic metabolic profile done to follow-up on electrolytes and renal function as well as a CBC done on follow-up. 2. Follow-up with Bing Neighbors, FNP in 1 to 2 weeks.  On follow-up patient need a basic metabolic profile done to follow-up on electrolytes and renal function.  Patient need a CBC done to follow-up on H&H.  Patient will need a magnesium level done.  Patient will need follow-up on bilateral lower extremity DVTs.  Patient was started on Eliquis. 3. Patient to be discharged home on home O2 2 L nasal cannula.   Discharge Diagnoses:  Principal Problem:   Pneumonia due to COVID-19 virus Active Problems:   Elevated d-dimer   Demand ischemia (HCC)   Anxiety   Acute respiratory failure with hypoxia (HCC)   Leg DVT (deep venous thromboembolism), acute, bilateral (HCC)   Discharge Condition: Stable and improved  Diet recommendation: Heart healthy  Filed Weights   09/13/19 1415 09/23/19 1333  Weight: 99 kg 87.1 kg    History of present illness:  HPI per Dr. Haskell Riling Bender is a 64 y.o. female with medical history significant of anemia, anxiety, GERD, IBS, thyroid disease, recent hospital admission 09/02/2019-09/10/2019 for acute hypoxic respiratory failure secondary to acute COVID-19 viral pneumonitis. She was treated with IV steroids, remdesivir, and Actemra.  Discharged home on 2 L supplemental oxygen.  She is presenting to the ED today with complaints of shortness of breath and wheezing.  Received  epinephrine and Solu-Medrol 125 mg by EMS. Patient states she has had worsening shortness of breath since her recent hospital discharge.  She continues to cough. Her oxygen saturation has been in the 80s on her pulse ox despite using 5 L supplemental oxygen at home.  No fevers, chills, chest pain, nausea, vomiting, abdominal pain, diarrhea.  States she tested positive for COVID-19 on 08/26/2019.  ED Course: Afebrile.  Tachycardic and tachypneic on arrival.  Oxygen saturation was in the low 80s on room air and patient was placed on 15 L oxygen via nonrebreather.  Labs showing WBC count 13.5.  Lactic acid x2 normal.  Procalcitonin <0.10. D-dimer significantly elevated at 6.71.  LDH 246.  Ferritin, fibrinogen, and CRP normal.  High-sensitivity troponin 18 >17. Blood culture x2 pending. Chest x-ray showing slight increase in patchy opacities consistent with clinical history of COVID-19 positivity. CT angiogram negative for PE.  Showing marked severity patchy bilateral infiltrates, markedly increased in severity when compared to the prior study. Patient received IV Decadron 6 mg. ED provider discussed the case with PCCM MD who did not feel the need for PCCM team involvement at this time.  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 Patient had presented with worsening shortness of breath, hypoxia.  CT angiogram of the chest was negative for PE however showed marked worsening bilateral infiltrates.  Patient improved clinically throughout her hospitalization.  Was on 10 L high flow O2 and prior to that was on 60 L O2 in the ED.  Patient  by day of discharge was on 2 to 3 L nasal cannula with sats greater than 93%.  Patient status post IV remdesivir during previous hospitalization.  Antibiotics were discontinued on 09/14/2019 with a negative procalcitonin.  Patient was followed by PCCM no role for antibiotics.     Patient was placed on IV steroids as well as Lasix 20 mg IV daily with clinical improvement.  Patient also maintained on vitamin C, zinc, bronchodilators.  Patient continued improving clinically during the hospitalization and O2 was weaned down.  Patient was -11.743 L during the hospitalization.  Patient was also maintained on IV heparin due to bilateral lower extremity DVT and subsequently transitioned to Eliquis which she tolerated.  Patient started on prophylactic dose of Bactrim 3 times weekly per recommendations from PCCM to decrease risk of PCP as patient on IV steroids and will likely require a very slow taper of steroids.  IV steroids were tapered down and patient subsequently tapered to oral prednisone.  Patient be discharged home on prednisone 40 mg daily x1 week, then 30 mg daily x1 week, then 20 mg until follow-up with pulmonary.  Patient will be discharged in stable and improved condition.  2.  Bilateral lower extremity DVT Likely secondary to COVID-19, hypercoagulable state.  D-dimer noted to be elevated on admission at 6.71.  CT angiogram chest negative for PE.  Lower extremity Dopplers positive for bilateral DVT.  Patient was initially placed on IV heparin.  It was noted that patient has no insurance.  Case manager consulted for medication assistance or from community wellness center to determine whether patient may be able to go home on a DOAC versus full dose Lovenox.  Per pulmonary patient will need to be treated for a minimum of 3 months for provoked DVT.  Patient was subsequently transition from IV heparin to Eliquis.  Patient received a 30-day coupon. Patient did state to case management that if a prescription was written her PCP might be able to help her get further refills on medication.  Outpatient follow-up.  3.  Anxiety Patient maintained on home regimen of sertraline as well as Klonopin 1 mg twice daily.  Outpatient follow-up with PCP.   4.  Mildly elevated troponin/demand  ischemia EKG did show T wave inversions, slight ST depressions in anterior lateral leads likely due to problem #1.  Patient asymptomatic.  2D echo from 09/14/2019 with a EF of 60 to 65%, normal LV and right ventricular function.  Outpatient follow-up with PCP.   5.  Obesity   Procedures:  CT angiogram chest 09/13/2019  2D echo 09/14/2019  Lower extremity Dopplers 09/14/2019   Consultations:  PCCM Dr. Marchelle Gearing 09/14/2019  Discharge Exam: Vitals:   09/24/19 1105 09/24/19 1325  BP:  114/80  Pulse:  91  Resp: 20 (!) 22  Temp:  98.5 F (36.9 C)  SpO2:  95%    General: NAD Cardiovascular: Regular rate rhythm no murmurs rubs or gallops. Respiratory: Decreased breath sounds in the bases.  Some scattered coarse breath sounds.  No wheezing.  Speaking in full sentences.  No use of accessory muscles of respiration.  Discharge Instructions   Discharge Instructions    Diet - low sodium heart healthy   Complete by: As directed    Discharge instructions   Complete by: As directed    ?   Person Under Monitoring Name: Ramata Strothman  Location: [id number] [place] [address]   Infection Prevention Recommendations for Individuals Confirmed to have, or Being Evaluated  for, 2019 Novel Coronavirus (COVID-19) Infection Who Receive Care at Home  Individuals who are confirmed to have, or are being evaluated for, COVID-19 should follow the prevention steps below until a healthcare provider or local or state health department says they can return to normal activities.  Stay home except to get medical care You should restrict activities outside your home, except for getting medical care. Do not go to work, school, or public areas, and do not use public transportation or taxis.  Call ahead before visiting your doctor Before your medical appointment, call the healthcare provider and tell them that you have, or are being evaluated for, COVID-19 infection. This will help  the healthcare provider's office take steps to keep other people from getting infected. Ask your healthcare provider to call the local or state health department.  Monitor your symptoms Seek prompt medical attention if your illness is worsening (e.g., difficulty breathing). Before going to your medical appointment, call the healthcare provider and tell them that you have, or are being evaluated for, COVID-19 infection. Ask your healthcare provider to call the local or state health department.  Wear a facemask You should wear a facemask that covers your nose and mouth when you are in the same room with other people and when you visit a healthcare provider. People who live with or visit you should also wear a facemask while they are in the same room with you.  Separate yourself from other people in your home As much as possible, you should stay in a different room from other people in your home. Also, you should use a separate bathroom, if available.  Avoid sharing household items You should not share dishes, drinking glasses, cups, eating utensils, towels, bedding, or other items with other people in your home. After using these items, you should wash them thoroughly with soap and water.  Cover your coughs and sneezes Cover your mouth and nose with a tissue when you cough or sneeze, or you can cough or sneeze into your sleeve. Throw used tissues in a lined trash can, and immediately wash your hands with soap and water for at least 20 seconds or use an alcohol-based hand rub.  Wash your [employer] your hands often and thoroughly with soap and water for at least 20 seconds. You can use an alcohol-based hand sanitizer if soap and water are not available and if your hands are not visibly dirty. Avoid touching your eyes, nose, and mouth with unwashed hands.   Prevention Steps for Caregivers and Household Members of Individuals Confirmed to have, or Being Evaluated for, COVID-19 Infection  Being Cared for in the Home  If you live with, or provide care at home for, a person confirmed to have, or being evaluated for, COVID-19 infection please follow these guidelines to prevent infection:  Follow healthcare provider's instructions Make sure that you understand and can help the patient follow any healthcare provider instructions for all care.  Provide for the patient's basic needs You should help the patient with basic needs in the home and provide support for getting groceries, prescriptions, and other personal needs.  Monitor the patient's symptoms If they are getting sicker, call his or her medical provider and tell them that the patient has, or is being evaluated for, COVID-19 infection. This will help the healthcare provider's office take steps to keep other people from getting infected. Ask the healthcare provider to call the local or state health department.  Limit the number of people who have  contact with the patient If possible, have only one caregiver for the patient. Other household members should stay in another home or place of residence. If this is not possible, they should stay in another room, or be separated from the patient as much as possible. Use a separate bathroom, if available. Restrict visitors who do not have an essential need to be in the home.  Keep older adults, very young children, and other sick people away from the patient Keep older adults, very young children, and those who have compromised immune systems or chronic health conditions away from the patient. This includes people with chronic heart, lung, or kidney conditions, diabetes, and cancer.  Ensure good ventilation Make sure that shared spaces in the home have good air flow, such as from an air conditioner or an opened window, weather permitting.  Wash your hands often Wash your hands often and thoroughly with soap and water for at least 20 seconds. You can use an alcohol based hand  sanitizer if soap and water are not available and if your hands are not visibly dirty. Avoid touching your eyes, nose, and mouth with unwashed hands. Use disposable paper towels to dry your hands. If not available, use dedicated cloth towels and replace them when they become wet.  Wear a facemask and gloves Wear a disposable facemask at all times in the room and gloves when you touch or have contact with the patient's blood, body fluids, and/or secretions or excretions, such as sweat, saliva, sputum, nasal mucus, vomit, urine, or feces.  Ensure the mask fits over your nose and mouth tightly, and do not touch it during use. Throw out disposable facemasks and gloves after using them. Do not reuse. Wash your hands immediately after removing your facemask and gloves. If your personal clothing becomes contaminated, carefully remove clothing and launder. Wash your hands after handling contaminated clothing. Place all used disposable facemasks, gloves, and other waste in a lined container before disposing them with other household waste. Remove gloves and wash your hands immediately after handling these items.  Do not share dishes, glasses, or other household items with the patient Avoid sharing household items. You should not share dishes, drinking glasses, cups, eating utensils, towels, bedding, or other items with a patient who is confirmed to have, or being evaluated for, COVID-19 infection. After the person uses these items, you should wash them thoroughly with soap and water.  Wash laundry thoroughly Immediately remove and wash clothes or bedding that have blood, body fluids, and/or secretions or excretions, such as sweat, saliva, sputum, nasal mucus, vomit, urine, or feces, on them. Wear gloves when handling laundry from the patient. Read and follow directions on labels of laundry or clothing items and detergent. In general, wash and dry with the warmest temperatures recommended on the  label.  Clean all areas the individual has used often Clean all touchable surfaces, such as counters, tabletops, doorknobs, bathroom fixtures, toilets, phones, keyboards, tablets, and bedside tables, every day. Also, clean any surfaces that may have blood, body fluids, and/or secretions or excretions on them. Wear gloves when cleaning surfaces the patient has come in contact with. Use a diluted bleach solution (e.g., dilute bleach with 1 part bleach and 10 parts water) or a household disinfectant with a label that says EPA-registered for coronaviruses. To make a bleach solution at home, add 1 tablespoon of bleach to 1 quart (4 cups) of water. For a larger supply, add  cup of bleach to 1 gallon (16 cups)  of water. Read labels of cleaning products and follow recommendations provided on product labels. Labels contain instructions for safe and effective use of the cleaning product including precautions you should take when applying the product, such as wearing gloves or eye protection and making sure you have good ventilation during use of the product. Remove gloves and wash hands immediately after cleaning.  Monitor yourself for signs and symptoms of illness Caregivers and household members are considered close contacts, should monitor their health, and will be asked to limit movement outside of the home to the extent possible. Follow the monitoring steps for close contacts listed on the symptom monitoring form.   ? If you have additional questions, contact your local health department or call the epidemiologist on call at [phone] (available 24/7). ? This guidance is subject to change. For the most up-to-date guidance from [facility], please refer to their website: [email or web address][email or web address]   Increase activity slowly   Complete by: As directed      Allergies as of 09/24/2019   No Known Allergies     Medication List    STOP taking these  medications   azithromycin 250 MG tablet Commonly known as: ZITHROMAX   methylPREDNISolone 4 MG Tbpk tablet Commonly known as: MEDROL DOSEPAK     TAKE these medications   albuterol 108 (90 Base) MCG/ACT inhaler Commonly known as: VENTOLIN HFA Inhale 1-2 puffs into the lungs every 6 (six) hours as needed for wheezing or shortness of breath.   apixaban 5 MG Tabs tablet Commonly known as: ELIQUIS Take 2 tablets (10 mg total) by mouth 2 (two) times daily. Take 2 tablets (10mg ) 2 times daily x4 days, then 1 tablet (5mg ) 2 times daily. Notes to patient: Take 2 tablets in the morning and at bedtime everyday until 4/9. On 4/19 you will begin taking JUST ONE tablet in the morning and at bedtime everday.     ascorbic acid 500 MG tablet Commonly known as: VITAMIN C Take 1 tablet (500 mg total) by mouth daily. Start taking on: September 25, 2019   benzonatate 200 MG capsule Commonly known as: TESSALON Take 1 capsule (200 mg total) by mouth 3 (three) times daily as needed for cough. What changed:   when to take this  reasons to take this   clonazePAM 1 MG tablet Commonly known as: KLONOPIN Take 1 mg by mouth at bedtime.   loratadine 10 MG tablet Commonly known as: CLARITIN Take 10 mg by mouth daily.   mometasone-formoterol 200-5 MCG/ACT Aero Commonly known as: DULERA Inhale 2 puffs into the lungs 2 (two) times daily.   omeprazole 40 MG capsule Commonly known as: PRILOSEC Take 1 capsule (40 mg total) by mouth daily.   ondansetron 4 MG disintegrating tablet Commonly known as: ZOFRAN-ODT Take 4 mg by mouth every 8 (eight) hours as needed for nausea or vomiting.   predniSONE 10 MG tablet Commonly known as: DELTASONE Take 2-4 tablets (20-40 mg total) by mouth daily before breakfast. Take 4 tablets (40mg ) daily x7 days, then 3 tablets (30mg ) daily x7 days, then 2 tablets (20mg ) daily until seen by pulmonary in follow-up. Start taking on: September 25, 2019   sertraline 100 MG  tablet Commonly known as: ZOLOFT Take 1 tablet (100 mg total) by mouth daily.   Spiriva HandiHaler 18 MCG inhalation capsule Generic drug: tiotropium Place 1 capsule (18 mcg total) into inhaler and inhale daily.   sulfamethoxazole-trimethoprim 800-160 MG tablet Commonly known as: BACTRIM DS Take 1 tablet by mouth 3 (  three) times a week. Start taking on: September 25, 2019 Notes to patient: Take on Mondays, Wednesdays, and Fridays.             Durable Medical Equipment  (From admission, onward)         Start     Ordered   09/23/19 1129  For home use only DME oxygen  Once    Question Answer Comment  Length of Need 6 Months   Mode or (Route) Nasal cannula   Liters per Minute 3   Frequency Continuous (stationary and portable oxygen unit needed)   Oxygen conserving device Yes   Oxygen delivery system Gas      09/23/19 1129         No Known Allergies Follow-up Information    [facility] Follow up.   Why: This is [facility], call them if you have a problem.  Contact information: [address] [place] [address] [phone]        [facility] Follow up.   Why: [facility] is your Home Health Physical Therapy please call if you have any problems. [phone] [facility]       Bing Neighbors, FNP. Schedule an appointment as soon as possible for a visit in 1 week(s).   Specialty: Family Medicine Why: f/u in 1-2 weeks. Contact information: [address] [place] [address] [phone]        Steffanie Dunn, DO Follow up on 10/28/2019.   Specialty: Pulmonary Disease Why: f/u at 10am Contact information: [address] [place] [address] [phone]            The results of significant diagnostics from this hospitalization (including imaging, microbiology, ancillary and laboratory) are listed below for reference.    Significant Diagnostic Studies: CT Angio Chest PE W and/or  Wo Contrast  Result Date: 09/13/2019 CLINICAL DATA:  Shortness of breath. EXAM: CT ANGIOGRAPHY CHEST WITH CONTRAST TECHNIQUE: Multidetector CT imaging of the chest was performed using the standard protocol during bolus administration of intravenous contrast. Multiplanar CT image reconstructions and MIPs were obtained to evaluate the vascular anatomy. CONTRAST:  OMNIPAQUE IOHEXOL 350 MG/ML SOLN COMPARISON:  September 02, 2019 FINDINGS: Cardiovascular: Satisfactory opacification of the pulmonary arteries to the segmental level. No evidence of pulmonary embolism. Normal heart size. No pericardial effusion. Mediastinum/Nodes: No enlarged mediastinal, hilar, or axillary lymph nodes. Thyroid gland, trachea, and esophagus demonstrate no significant findings. Lungs/Pleura: Marked severity patchy infiltrates are seen throughout both lungs. This is markedly increased in severity when compared to the prior study. There is no evidence of a pleural effusion or pneumothorax. Upper Abdomen: A stable 3.6 cm x 2.2 cm cystic appearing area is seen within the posteromedial aspect of the right lobe of the liver. Musculoskeletal: No chest wall abnormality. No acute or significant osseous findings. Review of the MIP images confirms the above findings. IMPRESSION: 1. No evidence of pulmonary embolism. 2. Marked severity patchy bilateral infiltrates, markedly increased in severity when compared to the prior study. Electronically Signed   By: Aram Candela M.D.   On: 09/13/2019 18:13   CT Angio Chest PE W and/or Wo Contrast  Result Date: 09/02/2019 CLINICAL DATA:  Shortness of breath. EXAM: CT ANGIOGRAPHY CHEST WITH CONTRAST TECHNIQUE: Multidetector CT imaging of the chest was performed using the standard protocol during bolus administration of intravenous contrast. Multiplanar CT image reconstructions and MIPs were obtained to evaluate the vascular anatomy.  CONTRAST:  OMNIPAQUE IOHEXOL 350 MG/ML SOLN COMPARISON:  None.  FINDINGS: Cardiovascular: Satisfactory opacification of the pulmonary arteries to the segmental level. No evidence of pulmonary embolism. Normal heart size. No pericardial effusion. Mediastinum/Nodes: No enlarged mediastinal, hilar, or axillary lymph nodes. Thyroid gland, trachea, and esophagus demonstrate no significant findings. Lungs/Pleura: Moderate severity diffuse areas of atelectasis and/or infiltrate are seen bilaterally, slightly more prominent within the bilateral lung bases. There is no evidence of a pleural effusion or pneumothorax. Upper Abdomen: A 3.7 cm x 2.1 cm cystic appearing areas seen within the posteromedial aspect of the right lobe of the liver. Musculoskeletal: Multilevel degenerative changes seen throughout the thoracic spine. Review of the MIP images confirms the above findings. IMPRESSION: 1. No evidence of acute pulmonary embolism. 2. Moderate severity diffuse areas of atelectasis and/or infiltrate bilaterally, slightly more prominent within the bilateral lung bases. 3. 3.7 cm cystic appearing lesion in the right lobe of the liver. Electronically Signed   By: Aram Candela M.D.   On: 09/02/2019 21:28   DG CHEST PORT 1 VIEW  Result Date: 09/17/2019 CLINICAL DATA:  Shortness of breath. COVID positive. EXAM: PORTABLE CHEST 1 VIEW COMPARISON:  Radiograph and CT 09/13/2019. FINDINGS: Low lung volumes persist but are slightly improved compared to prior exam. Heterogeneous bilateral lung opacities, improvement in the mid lower lung zones from prior. Unchanged heart size and mediastinal contours. No pneumothorax or large pleural effusion. IMPRESSION: Slight improved aeration with diminishing bilateral lung opacities consistent with improving COVID pneumonia. Electronically Signed   By: Narda Rutherford M.D.   On: 09/17/2019 19:12   DG Chest Port 1 View  Result Date: 09/13/2019 CLINICAL DATA:  Shortness of breath, COVID-19 positivity EXAM: PORTABLE CHEST 1 VIEW COMPARISON:  08/19/2019  FINDINGS: Cardiac shadow is stable. Patchy opacities are again seen throughout both lungs slightly increased from the prior exam consistent with the given clinical history of COVID-19 positivity. No sizable effusion is noted. No bony abnormality is seen. IMPRESSION: Slight increase in patchy opacities consistent with the given clinical history of COVID-19 positivity. Electronically Signed   By: Alcide Clever M.D.   On: 09/13/2019 16:18   DG Chest Port 1 View  Result Date: 09/08/2019 CLINICAL DATA:  COVID-19 positive.  Shortness of breath. EXAM: PORTABLE CHEST 1 VIEW COMPARISON:  September 06, 2019 FINDINGS: Bilateral pulmonary infiltrates are stable on the left and in the right base in the interval. The heart, hila, mediastinum, lungs, and pleura are otherwise unchanged. IMPRESSION: Stable bilateral pulmonary infiltrates. Electronically Signed   By: Gerome Sam III M.D   On: 09/08/2019 10:23   DG Chest Port 1 View  Result Date: 09/06/2019 CLINICAL DATA:  Shortness of breath EXAM: PORTABLE CHEST 1 VIEW COMPARISON:  09/02/2019 plain film and CT FINDINGS: Midline trachea. Mild cardiomegaly. Given differences in technique, similar left greater than right peripheral and basilar predominant interstitial opacities. Mild right hemidiaphragm elevation. No pleural effusion or pneumothorax. IMPRESSION: No significant change in basilar and peripheral predominant interstitial opacities, most consistent with COVID-19 pneumonia. Electronically Signed   By: Jeronimo Greaves M.D.   On: 09/06/2019 08:47   DG Chest Port 1 View  Result Date: 09/02/2019 CLINICAL DATA:  Short of breath. EXAM: PORTABLE CHEST 1 VIEW COMPARISON:  None FINDINGS: Cardiac enlargement. Decreased lung volumes. Bilateral peripheral and lower lobe predominant opacities are identified. No pleural effusion or edema. IMPRESSION: Bilateral peripheral and lower lobe predominant opacities, which in the acute setting are concerning for multifocal pneumonia.  Electronically Signed  By: Signa Kell M.D.   On: 09/02/2019 19:51   VAS [place] LOWER EXTREMITY VENOUS (DVT)  Result Date: 09/14/2019  Lower Venous DVTStudy Indications: Covid +, elevated d-dimer=6.71.  Comparison Study: No prior study Performing Technologist: Gertie Fey RDMS, RVT, RDCS  Examination Guidelines: A complete evaluation includes B-mode imaging, spectral Doppler, color Doppler, and power Doppler as needed of all accessible portions of each vessel. Bilateral testing is considered an integral part of a complete examination. Limited examinations for reoccurring indications may be performed as noted. The reflux portion of the exam is performed with the patient in reverse Trendelenburg.  +---------+---------------+---------+-----------+----------+--------------+ RIGHT    CompressibilityPhasicitySpontaneityPropertiesThrombus Aging +---------+---------------+---------+-----------+----------+--------------+ CFV      Full           Yes      Yes                                 +---------+---------------+---------+-----------+----------+--------------+ SFJ      Full                                                        +---------+---------------+---------+-----------+----------+--------------+ FV Prox  Full                                                        +---------+---------------+---------+-----------+----------+--------------+ FV Mid   Full                                                        +---------+---------------+---------+-----------+----------+--------------+ FV DistalFull                                                        +---------+---------------+---------+-----------+----------+--------------+ PFV      Full                                                        +---------+---------------+---------+-----------+----------+--------------+ POP      Full           Yes      Yes                                  +---------+---------------+---------+-----------+----------+--------------+ PTV      None                                         Acute          +---------+---------------+---------+-----------+----------+--------------+ PERO     None  Acute          +---------+---------------+---------+-----------+----------+--------------+   +---------+---------------+---------+-----------+----------+--------------+ LEFT     CompressibilityPhasicitySpontaneityPropertiesThrombus Aging +---------+---------------+---------+-----------+----------+--------------+ CFV      Full           Yes      Yes                                 +---------+---------------+---------+-----------+----------+--------------+ SFJ      Full                                                        +---------+---------------+---------+-----------+----------+--------------+ FV Prox  Full                                                        +---------+---------------+---------+-----------+----------+--------------+ FV Mid   Full                                                        +---------+---------------+---------+-----------+----------+--------------+ FV DistalFull                                                        +---------+---------------+---------+-----------+----------+--------------+ PFV      Full                                                        +---------+---------------+---------+-----------+----------+--------------+ POP      Full           Yes      Yes                                 +---------+---------------+---------+-----------+----------+--------------+ PTV      None                                         Acute          +---------+---------------+---------+-----------+----------+--------------+ PERO     Full                                                         +---------+---------------+---------+-----------+----------+--------------+ Gastroc  None  Acute          +---------+---------------+---------+-----------+----------+--------------+     Summary: RIGHT: - Findings consistent with acute deep vein thrombosis involving the right posterior tibial veins, and right peroneal veins. - Ultrasound characteristics of a ruptured Baker's Cyst are noted.  LEFT: - Findings consistent with acute deep vein thrombosis involving the left posterior tibial veins, and left peroneal veins. - Ultrasound characteristics of a ruptured Baker's Cyst are noted.  *See table(s) above for measurements and observations. Electronically signed by Waverly Ferrari MD on 09/14/2019 at 2:37:13 PM.    Final    ECHOCARDIOGRAM LIMITED  Result Date: 09/14/2019    ECHOCARDIOGRAM LIMITED REPORT   Patient Name:   Kristen Bender Date of Exam: 09/14/2019 Medical Rec #:  [id number]        Height: Accession #:    [id number]       Weight: Date of Birth:  08/28/1955         BSA: Patient Age:    64 years         BP:           121/72 mmHg Patient Gender: F                HR:           84 bpm. Exam Location:  Inpatient Procedure: 2D Echo Indications:    EKG abnormalities  History:        Patient has no prior history of Echocardiogram examinations. No                 prior cardiac hx on file.  Sonographer:    Celene Skeen RDCS (AE) Referring Phys: John Giovanni IMPRESSIONS  1. Left ventricular ejection fraction, by estimation, is 60 to 65%. The left ventricle has normal function.  2. Right ventricular systolic function is normal. The right ventricular size is normal.  3. The mitral valve is normal in structure. No evidence of mitral valve regurgitation. No evidence of mitral stenosis.  4. The aortic valve is normal in structure. Aortic valve regurgitation is not visualized. No aortic stenosis is present. FINDINGS  Left Ventricle: Left ventricular ejection fraction,  by estimation, is 60 to 65%. The left ventricle has normal function. The left ventricular internal cavity size was normal in size. There is no left ventricular hypertrophy. Right Ventricle: The right ventricular size is normal. No increase in right ventricular wall thickness. Right ventricular systolic function is normal. Left Atrium: Left atrial size was normal in size. Right Atrium: Right atrial size was normal in size. Pericardium: Trivial pericardial effusion is present. Mitral Valve: The mitral valve is normal in structure. No evidence of mitral valve stenosis. Tricuspid Valve: The tricuspid valve is grossly normal. Tricuspid valve regurgitation is not demonstrated. Aortic Valve: The aortic valve is normal in structure. Aortic valve regurgitation is not visualized. No aortic stenosis is present. Pulmonic Valve: The pulmonic valve was normal in structure. Pulmonic valve regurgitation is not visualized. Aorta: The aortic root and ascending aorta are structurally normal, with no evidence of dilitation. IAS/Shunts: The atrial septum is grossly normal. Kristeen Miss MD Electronically signed by Kristeen Miss MD Signature Date/Time: 09/14/2019/4:10:14 PM    Final    [place] EKG SITE RITE  Result Date: 09/14/2019 If Site Rite image not attached, placement could not be confirmed due to current cardiac rhythm.   Microbiology: No results found for this or any previous visit (from the past 240 hour(s)).   Labs: Basic Metabolic Panel: Recent Labs  Lab 09/20/19 0446 09/21/19 0825 09/22/19 0420 09/23/19 0435 09/24/19 0556  NA 138 130* 133* 135 135  K 4.6 4.6 4.3 4.2 4.7  CL 101 95* 96* 97* 96*  CO2 GLUCOSE 132* 103* 115* 126* 103*  BUN 28* 28* 34* 34* 34*  CREATININE 0.64 0.56 0.62 0.55 0.71  CALCIUM 9.6 9.4 10.1 9.7 9.5  MG 2.5*  --   --  2.2  --   PHOS 3.6  --   --   --   --    Liver Function Tests: No results for input(s): AST, ALT, ALKPHOS, BILITOT, PROT, ALBUMIN in the last 168  hours. No results for input(s): LIPASE, AMYLASE in the last 168 hours. No results for input(s): AMMONIA in the last 168 hours. CBC: Recent Labs  Lab 09/20/19 0446 09/21/19 0825 09/22/19 0420 09/23/19 0435 09/24/19 0457  WBC 16.1* 17.8* 15.3* 14.5* 15.0*  NEUTROABS 13.7* 14.1* 11.8*  --   --   HGB 13.4 13.9 13.5 13.3 13.3  HCT 40.5 42.2 41.9 40.7 40.6  MCV 100.0 99.5 100.5* 100.5* 100.2*  PLT 190 228 223 205 198   Cardiac Enzymes: No results for input(s): CKTOTAL, CKMB, CKMBINDEX, TROPONINI in the last 168 hours. BNP: BNP (last 3 results) Recent Labs    09/08/19 0405 09/09/19 0916 09/10/19 0232  BNP 37.9 38.8 62.5    ProBNP (last 3 results) No results for input(s): PROBNP in the last 8760 hours.  CBG: No results for input(s): GLUCAP in the last 168 hours.     Signed:  Ramiro Harvest MD.  [facility] 09/24/2019, 3:45 PM

## 2019-09-24 NOTE — Plan of Care (Signed)
  Problem: Clinical Measurements: Goal: Will remain free from infection 09/24/2019 1542 by Gwendlyn Deutscher, RN Outcome: Adequate for Discharge 09/24/2019 0839 by Gwendlyn Deutscher, RN Outcome: Progressing Note: Pt recovering from COVID 19 infection.  Goal: Diagnostic test results will improve 09/24/2019 1542 by Gwendlyn Deutscher, RN Outcome: Adequate for Discharge 09/24/2019 0839 by Gwendlyn Deutscher, RN Outcome: Progressing Goal: Respiratory complications will improve 09/24/2019 1542 by Gwendlyn Deutscher, RN Outcome: Adequate for Discharge 09/24/2019 0839 by Gwendlyn Deutscher, RN Outcome: Progressing   Problem: Activity: Goal: Risk for activity intolerance will decrease 09/24/2019 1542 by Gwendlyn Deutscher, RN Outcome: Adequate for Discharge 09/24/2019 0839 by Gwendlyn Deutscher, RN Outcome: Progressing

## 2019-09-25 ENCOUNTER — Telehealth: Payer: Self-pay

## 2019-09-25 NOTE — Telephone Encounter (Signed)
Transition Care Management Follow-up Telephone Call Date of discharge and from where: 09/24/2019, [facility]  Call placed to patient as her PCP was listed as Joaquin Courts, NP.   The patient requested that this CM call her daughter, [name] # [phone]. [name] said that her mother has a new PCP but she was sure of the name. Ms Tiburcio Pea, NP removed as PCP in Epic

## 2019-10-28 ENCOUNTER — Ambulatory Visit (INDEPENDENT_AMBULATORY_CARE_PROVIDER_SITE_OTHER): Payer: Self-pay | Admitting: Critical Care Medicine

## 2019-10-28 ENCOUNTER — Other Ambulatory Visit: Payer: Self-pay

## 2019-10-28 ENCOUNTER — Encounter: Payer: Self-pay | Admitting: Critical Care Medicine

## 2019-10-28 VITALS — BP 142/68 | HR 97 | Temp 97.9°F | Ht 66.0 in | Wt 205.8 lb

## 2019-10-28 DIAGNOSIS — R490 Dysphonia: Secondary | ICD-10-CM

## 2019-10-28 DIAGNOSIS — J8489 Other specified interstitial pulmonary diseases: Secondary | ICD-10-CM

## 2019-10-28 DIAGNOSIS — Z8616 Personal history of COVID-19: Secondary | ICD-10-CM

## 2019-10-28 DIAGNOSIS — Z23 Encounter for immunization: Secondary | ICD-10-CM

## 2019-10-28 DIAGNOSIS — Z7952 Long term (current) use of systemic steroids: Secondary | ICD-10-CM

## 2019-10-28 MED ORDER — PREDNISONE 5 MG PO TABS: 15.0000 mg | ORAL_TABLET | Freq: Every day | ORAL | 1 refills | Status: AC

## 2019-10-28 NOTE — Patient Instructions (Addendum)
Thank you for visiting Dr. Chestine Spore at [facility]. We recommend the following: Orders Placed This Encounter  Procedures  . Pulmonary function test   Orders Placed This Encounter  Procedures  . Pulmonary function test    Standing Status:   Future    Standing Expiration Date:   10/27/2020    Order Specific Question:   Where should this test be performed?    Answer:   [facility]    Order Specific Question:   Full PFT: includes the following: basic spirometry, spirometry pre & post bronchodilator, diffusion capacity (DLCO), lung volumes    Answer:   Full PFT   Prednisone 5mg  pills (you previously had 10mg  pills): 15mg  daily (3 pills), then 10mg  per day (2 pills), then 7.5mg  per day (1.5 pills), then 5mg  per day (1 pill), then 2.5mg  per day (0.5 pill) Then stop  If your symptoms worsen after a dose decrease, you should go back to the previous week's dose and call .  Ok to stop Dulera (blue) inhaler. Keep using Spiriva inhaler once daily.   Meds ordered this encounter  Medications  . predniSONE (DELTASONE) 5 MG tablet    Sig: Take 3 tablets (15 mg total) by mouth daily with breakfast.    Dispense:  80 tablet    Refill:  1    Return in about 4 weeks (around 11/25/2019).    Please do your part to reduce the spread of COVID-19.

## 2019-10-28 NOTE — Progress Notes (Signed)
Synopsis: Referred in May 2021 for abnormal chest CT by Bing Neighbors, FNP.  Subjective:   PATIENT ID: Kristen Bender GENDER: female DOB: November 29, 1955, MRN: [id number]  Chief Complaint  Patient presents with  . Hospitalization Follow-up    r/t horseness and cough r/t inflamtion     [name] is a 64 y/o woman who presents for post hospital follow-up.  She was hospitalized from 3/26-4/6 for acute hypoxic respiratory failure with concern for Boop.  She was discharged on 2 to 3 L supplemental oxygen and was diuresed 12 L during her hospitalization.  She previously had been treated from 3/15-3/23 for Covid with steroids, remdesivir, Actemra.  At that time she was discharged on 2 to 3 L home oxygen.  Upon discharge from her second hospital stay she was discharged on a prednisone taper.  She took her last dose of steroids today-20 mg daily and P JP prophylaxis 3 days/week.  She complains of hoarseness and cough.  Her hoarseness started about 2 weeks ago, and she attributes it to the [facility] that she has been taking.  She feels like her breathing is back to normal and is feeling well.  She is no longer using supplemental oxygen.  She continues to feel weak and is working on rehabilitation.  She is seeing physical therapy.  She had a chest x-ray on 5/6 with report from [facility] reporting a normal scan.  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     Past Medical History:  Diagnosis Date  . Anemia   . Anxiety   . GERD (gastroesophageal reflux disease)   . IBS (irritable bowel syndrome)   . Thyroid disease      Family History  Problem Relation Age of Onset  . Heart disease Mother   . Hypertension Mother   . Mental illness Mother   . Heart disease Father   . Hypertension Father      Past Surgical History:  Procedure Laterality Date  . CESAREAN SECTION     X 2    Social History   Socioeconomic History  . Marital status:  Married    Spouse name: Not on file  . Number of children: Not on file  . Years of education: Not on file  . Highest education level: Not on file  Occupational History  . Not on file  Tobacco Use  . Smoking status: Never Smoker  . Smokeless tobacco: Never Used  Substance and Sexual Activity  . Alcohol use: Not Currently  . Drug use: Never  . Sexual activity: Not on file  Other Topics Concern  . Not on file  Social History Narrative  . Not on file   Social Determinants of Health   Financial Resource Strain:   . Difficulty of Paying Living Expenses:   Food Insecurity:   . Worried About Programme researcher, broadcasting/film/video in the Last Year:   . Barista in the Last Year:   Transportation Needs:   . Freight forwarder (Medical):   Marland Kitchen Lack of Transportation (Non-Medical):   Physical Activity:   . Days of Exercise per Week:   . Minutes of Exercise per Session:   Stress:   . Feeling of Stress :   Social Connections:   . Frequency of Communication with Friends and Family:   . Frequency of Social Gatherings with Friends and Family:   . Attends Religious Services:   . Active Member of  Clubs or Organizations:   . Attends Banker Meetings:   Marland Kitchen Marital Status:   Intimate Partner Violence:   . Fear of Current or Ex-Partner:   . Emotionally Abused:   Marland Kitchen Physically Abused:   . Sexually Abused:      No Known Allergies   Immunization History  Administered Date(s) Administered  . Influenza, Seasonal, Injecte, Preservative Fre 04/25/2007  . Pneumococcal Polysaccharide-23 10/28/2019    Outpatient Medications Prior to Visit  Medication Sig Dispense Refill  . albuterol (VENTOLIN HFA) 108 (90 Base) MCG/ACT inhaler Inhale 1-2 puffs into the lungs every 6 (six) hours as needed for wheezing or shortness of breath.    Marland Kitchen apixaban (ELIQUIS) 5 MG TABS tablet Take 2 tablets (10 mg total) by mouth 2 (two) times daily. Take 2 tablets (10mg ) 2 times daily x4 days, then 1 tablet (5mg ) 2  times daily. 60 tablet 2  . ascorbic acid (VITAMIN C) 500 MG tablet Take 1 tablet (500 mg total) by mouth daily.    . clonazePAM (KLONOPIN) 1 MG tablet Take 1 mg by mouth at bedtime.    loratadine (CLARITIN) 10 MG tablet Take 10 mg by mouth daily.    . mometasone-formoterol (DULERA) 200-5 MCG/ACT AERO Inhale 2 puffs into the lungs 2 (two) times daily. 13 g 1  . omeprazole (PRILOSEC) 40 MG capsule Take 1 capsule (40 mg total) by mouth daily. 30 capsule 3  . ondansetron (ZOFRAN-ODT) 4 MG disintegrating tablet Take 4 mg by mouth every 8 (eight) hours as needed for nausea or vomiting.     . sertraline (ZOLOFT) 100 MG tablet Take 1 tablet (100 mg total) by mouth daily. 30 tablet 0  . sulfamethoxazole-trimethoprim (BACTRIM DS) 800-160 MG tablet Take 1 tablet by mouth 3 (three) times a week. 15 tablet 0  . tiotropium (SPIRIVA HANDIHALER) 18 MCG inhalation capsule Place 1 capsule (18 mcg total) into inhaler and inhale daily. 30 capsule 2  . predniSONE (DELTASONE) 10 MG tablet Take 2-4 tablets (20-40 mg total) by mouth daily before breakfast. Take 4 tablets (40mg ) daily x7 days, then 3 tablets (30mg ) daily x7 days, then 2 tablets (20mg ) daily until seen by pulmonary in follow-up. 90 tablet 0  . benzonatate (TESSALON) 200 MG capsule Take 1 capsule (200 mg total) by mouth 3 (three) times daily as needed for cough. (Patient not taking: Reported on 10/28/2019) 20 capsule 0   No facility-administered medications prior to visit.    Review of Systems  Constitutional: Negative for chills and fever.  HENT:       Hoarseness  Respiratory: Positive for cough. Negative for shortness of breath and wheezing.   Cardiovascular: Negative for chest pain and leg swelling.  Gastrointestinal: Negative for blood in stool and melena.  Genitourinary: Negative for hematuria.  Endo/Heme/Allergies: Does not bruise/bleed easily.     Objective:   Vitals:   10/28/19 1015  BP: (!) 142/68  Pulse: 97  Temp: 97.9 F (36.6  C)  TempSrc: Temporal  SpO2: 97%  Weight: 205 lb 12.8 oz (93.4 kg)  Height: 5\' 6"  (1.676 m)   97% on   RA BMI Readings from Last 3 Encounters:  10/28/19 33.22 kg/m  09/23/19 30.07 kg/m  09/05/19 37.50 kg/m   Wt Readings from Last 3 Encounters:  10/28/19 205 lb 12.8 oz (93.4 kg)  09/23/19 191 lb 15.6 oz (87.1 kg)  09/05/19 218 lb 7.6 oz (99.1 kg)    Physical Exam Vitals reviewed.  Constitutional:  Appearance: Normal appearance. She is obese. She is not ill-appearing.  HENT:     Head: Normocephalic and atraumatic.     Mouth/Throat:     Comments: Very hoarse voice Eyes:     General: No scleral icterus. Cardiovascular:     Rate and Rhythm: Normal rate and regular rhythm.  Pulmonary:     Comments: Breathing comfortably on room air, clear to auscultation bilaterally.  No conversational dyspnea. Abdominal:     General: There is no distension.     Palpations: Abdomen is soft.     Tenderness: There is no abdominal tenderness.  Musculoskeletal:        General: No swelling or deformity.     Cervical back: Neck supple.  Lymphadenopathy:     Cervical: No cervical adenopathy.  Skin:    General: Skin is warm and dry.     Findings: No rash.  Neurological:     General: No focal deficit present.     Mental Status: She is alert.     Coordination: Coordination normal.  Psychiatric:        Mood and Affect: Mood normal.        Behavior: Behavior normal.      CBC    Component Value Date/Time   WBC 15.0 (H) 09/24/2019 0457   RBC 4.05 09/24/2019 0457   HGB 13.3 09/24/2019 0457   HCT 40.6 09/24/2019 0457   PLT 198 09/24/2019 0457   MCV 100.2 (H) 09/24/2019 0457   MCH 32.8 09/24/2019 0457   MCHC 32.8 09/24/2019 0457   RDW 14.0 09/24/2019 0457   LYMPHSABS 2.0 09/22/2019 0420   MONOABS 1.0 09/22/2019 0420   EOSABS 0.0 09/22/2019 0420   BASOSABS 0.1 09/22/2019 0420    CHEMISTRY No results for input(s): NA, K, CL, CO2, GLUCOSE, BUN, CREATININE, CALCIUM, MG, PHOS in  the last 168 hours. CrCl cannot be calculated (Patient's most recent lab result is older than the maximum 21 days allowed.).  09/18/2019: ANCA negative CCP negative Rheumatoid factor negative ANA negative Antidouble-stranded DNA negative SCL 70 - negative  Chest Imaging- films reviewed: CTA chest 09/13/2019-diffuse bilateral peripherally predominant groundglass opacities.  Patulous esophagus.  No PE.  Pulmonary Functions Testing Results: No flowsheet data found.   Echocardiogram 09/14/2019: LVEF 60 to 65%, no LVH, normal RV function.      Assessment & Plan:     ICD-10-CM   1. Current chronic use of systemic steroids  Z79.52   2. History of COVID-19  Z86.16   3. BOOP (bronchiolitis obliterans with organizing pneumonia) ([place])  J84.89 Pulmonary function test  4. Hoarseness  R49.0     BOOP, likely post Covid pneumonia; on high-dose steroids for the past 1.5 months -Reviewed her CT scans from her 2 hospitalizations with Mrs. Brahmbhatt and her husband. -Continue prednisone taper with weekly dose decreases -repeat prescription filled. Prednisone 5mg  pills (you previously had 10mg  pills):  15mg  daily (3 pills), then  10mg  per day (2 pills), then  7.5mg  per day (1.5 pills), then  5mg  per day (1 pill), then  2.5mg  per day (0.5 pill), then stop -At any time if she has worsening symptoms she should let us know immediately and return to her previous weeks dose.  She should have a stat chest x-ray and be seen in the office.  If she is unable to fully wean off steroids, a steroid sparing agent should be started. -If she requires increased doses of steroids more than 20 mg daily Bactrim prophylaxis should be  restarted once daily on on Monday Wednesday Friday -Okay to discontinue Dulera  Hoarseness due to inhaled corticosteroids -Okay to discontinue Dulera  RTC in 1 month.    Current Outpatient Medications:  .  albuterol (VENTOLIN HFA) 108 (90 Base) MCG/ACT inhaler, Inhale 1-2  puffs into the lungs every 6 (six) hours as needed for wheezing or shortness of breath., Disp: , Rfl:  .  apixaban (ELIQUIS) 5 MG TABS tablet, Take 2 tablets (10 mg total) by mouth 2 (two) times daily. Take 2 tablets (10mg ) 2 times daily x4 days, then 1 tablet (5mg ) 2 times daily., Disp: 60 tablet, Rfl: 2 .  ascorbic acid (VITAMIN C) 500 MG tablet, Take 1 tablet (500 mg total) by mouth daily., Disp:  , Rfl:  .  clonazePAM (KLONOPIN) 1 MG tablet, Take 1 mg by mouth at bedtime., Disp: , Rfl:  .  loratadine (CLARITIN) 10 MG tablet, Take 10 mg by mouth daily., Disp: , Rfl:  .  mometasone-formoterol (DULERA) 200-5 MCG/ACT AERO, Inhale 2 puffs into the lungs 2 (two) times daily., Disp: 13 g, Rfl: 1 .  omeprazole (PRILOSEC) 40 MG capsule, Take 1 capsule (40 mg total) by mouth daily., Disp: 30 capsule, Rfl: 3 .  ondansetron (ZOFRAN-ODT) 4 MG disintegrating tablet, Take 4 mg by mouth every 8 (eight) hours as needed for nausea or vomiting. , Disp: , Rfl:  .  sertraline (ZOLOFT) 100 MG tablet, Take 1 tablet (100 mg total) by mouth daily., Disp: 30 tablet, Rfl: 0 .  sulfamethoxazole-trimethoprim (BACTRIM DS) 800-160 MG tablet, Take 1 tablet by mouth 3 (three) times a week., Disp: 15 tablet, Rfl: 0 .  tiotropium (SPIRIVA HANDIHALER) 18 MCG inhalation capsule, Place 1 capsule (18 mcg total) into inhaler and inhale daily., Disp: 30 capsule, Rfl: 2 .  benzonatate (TESSALON) 200 MG capsule, Take 1 capsule (200 mg total) by mouth 3 (three) times daily as needed for cough. (Patient not taking: Reported on 10/28/2019), Disp: 20 capsule, Rfl: 0 .  predniSONE (DELTASONE) 5 MG tablet, Take 3 tablets (15 mg total) by mouth daily with breakfast., Disp: 80 tablet, Rfl: 1     , DO [facility] 10/28/2019 7:27 PM

## 2019-12-14 ENCOUNTER — Other Ambulatory Visit (HOSPITAL_COMMUNITY): Payer: Self-pay

## 2019-12-17 ENCOUNTER — Ambulatory Visit: Payer: Self-pay | Admitting: Critical Care Medicine

## 2019-12-21 ENCOUNTER — Other Ambulatory Visit (HOSPITAL_COMMUNITY)
Admission: RE | Admit: 2019-12-21 | Discharge: 2019-12-21 | Disposition: A | Discharge status: Home / Self Care | Payer: Self-pay | Source: Ambulatory Visit | Attending: Critical Care Medicine | Admitting: Critical Care Medicine

## 2019-12-21 DIAGNOSIS — Z01812 Encounter for preprocedural laboratory examination: Secondary | ICD-10-CM | POA: Insufficient documentation

## 2019-12-21 DIAGNOSIS — Z20822 Contact with and (suspected) exposure to covid-19: Secondary | ICD-10-CM | POA: Insufficient documentation

## 2019-12-21 LAB — SARS CORONAVIRUS 2 (TAT 6-24 HRS): SARS Coronavirus 2: NEGATIVE

## 2019-12-24 ENCOUNTER — Ambulatory Visit (INDEPENDENT_AMBULATORY_CARE_PROVIDER_SITE_OTHER): Payer: Self-pay

## 2019-12-24 ENCOUNTER — Telehealth: Payer: Self-pay | Admitting: Pulmonary Disease

## 2019-12-24 ENCOUNTER — Ambulatory Visit (INDEPENDENT_AMBULATORY_CARE_PROVIDER_SITE_OTHER): Payer: Self-pay | Admitting: Critical Care Medicine

## 2019-12-24 ENCOUNTER — Encounter: Payer: Self-pay | Admitting: Pulmonary Disease

## 2019-12-24 ENCOUNTER — Ambulatory Visit (INDEPENDENT_AMBULATORY_CARE_PROVIDER_SITE_OTHER): Payer: Self-pay | Admitting: Pulmonary Disease

## 2019-12-24 ENCOUNTER — Other Ambulatory Visit: Payer: Self-pay

## 2019-12-24 VITALS — BP 132/84 | HR 108 | Temp 98.7°F | Ht 66.0 in | Wt 209.4 lb

## 2019-12-24 DIAGNOSIS — J1282 Pneumonia due to coronavirus disease 2019: Secondary | ICD-10-CM

## 2019-12-24 DIAGNOSIS — I82403 Acute embolism and thrombosis of unspecified deep veins of lower extremity, bilateral: Secondary | ICD-10-CM

## 2019-12-24 DIAGNOSIS — J309 Allergic rhinitis, unspecified: Secondary | ICD-10-CM

## 2019-12-24 DIAGNOSIS — Z0000 Encounter for general adult medical examination without abnormal findings: Secondary | ICD-10-CM

## 2019-12-24 DIAGNOSIS — J9601 Acute respiratory failure with hypoxia: Secondary | ICD-10-CM

## 2019-12-24 DIAGNOSIS — U071 COVID-19: Secondary | ICD-10-CM

## 2019-12-24 DIAGNOSIS — J8489 Other specified interstitial pulmonary diseases: Secondary | ICD-10-CM

## 2019-12-24 LAB — PULMONARY FUNCTION TEST
DL/VA % pred: 92 %
DL/VA: 4.64 ml/min/mmHg/L
DLCO cor % pred: 61 %
DLCO cor: 16.5 ml/min/mmHg
DLCO unc % pred: 61 %
DLCO unc: 16.5 ml/min/mmHg
FEF 25-75 Post: 2.75 L/sec
FEF 25-75 Pre: 2.63 L/sec
FEF2575-%Change-Post: 4 %
FEF2575-%Pred-Post: 132 %
FEF2575-%Pred-Pre: 126 %
FEV1-%Change-Post: 0 %
FEV1-%Pred-Post: 93 %
FEV1-%Pred-Pre: 94 %
FEV1-Post: 2.22 L
FEV1-Pre: 2.24 L
FEV1FVC-%Change-Post: 2 %
FEV1FVC-%Pred-Pre: 107 %
FEV6-%Change-Post: -3 %
FEV6-%Pred-Post: 76 %
FEV6-%Pred-Pre: 79 %
FEV6-Post: 2.54 L
FEV6-Pre: 2.63 L
FEV6FVC-%Pred-Post: 90 %
FEV6FVC-%Pred-Pre: 90 %
FVC-%Change-Post: -3 %
FVC-%Pred-Post: 84 %
FVC-%Pred-Pre: 87 %
FVC-Post: 2.54 L
FVC-Pre: 2.63 L
Post FEV1/FVC ratio: 87 %
Post FEV6/FVC ratio: 100 %
Pre FEV1/FVC ratio: 85 %
Pre FEV6/FVC Ratio: 100 %
RV % pred: 73 %
RV: 1.6 L
TLC % pred: 88 %
TLC: 4.73 L

## 2019-12-24 NOTE — Telephone Encounter (Signed)
Patient called with questions about Spiriva and nasal spray. Questions cleared up with provider Elisha Headland who saw her today. She is to stop Spiriva and start saline nasal spray. Patient verbalized understanding of answers and plan of care from today's office visit.

## 2019-12-24 NOTE — Assessment & Plan Note (Signed)
Rhinorrhea and postnasal drip on clinical exam today  Plan: Start fluticasone nasal spray 1 spray each nostril after nasal saline rinses Start nasal saline rinses twice daily

## 2019-12-24 NOTE — Progress Notes (Signed)
@Patient  ID: , female    DOB: 1956/01/07, 64 y.o.   MRN: [id number]  Chief Complaint  Patient presents with  . Follow-up    Referring provider: [id number], FNP  HPI:  63 year old female never smoker followed in our office for Boop, acute respiratory failure with hypoxemia, history of pneumonia from COVID-19 virus  PMH: GERD, anxiety, history of DVT Smoker/ Smoking History: Never smoker Maintenance: Spiriva HandiHaler Pt of: Dr. 77  12/24/2019  - Visit   64 year old female never smoker followed in our office for Boop.  She is followed by Dr. 77.  She was last seen in our office in May/2021.  Plan of care from that follow-up was for patient to be continued on a prednisone taper.  Patient was informed that she could stop her Dulera due to her hoarseness.  Excerpt from the last office visit listed below detailing her previous hospitalizations:  She was hospitalized from 3/26-4/6 for acute hypoxic respiratory failure with concern for Boop.  She was discharged on 2 to 3 L supplemental oxygen and was diuresed 12 L during her hospitalization.  She previously had been treated from 3/15-3/23 for Covid with steroids, remdesivir, Actemra.  At that time she was discharged on 2 to 3 L home oxygen.  Upon discharge from her second hospital stay she was discharged on a prednisone taper.  Patient presenting to office today as a follow-up visit.  She reports that she finished her prednisone taper from last office visit in mid June/2021.  She reports that her symptoms have improved and stabilized.  She has not felt worsened shortness of breath, dyspnea, cough, congestion since stopping the prednisone as outlined in June/2021.  Patient presenting today with multiple recent imaging results that are listed below:  10/09/2019-venous Doppler extremity ultrasounds-no evidence of DVT in bilateral lower extremities  Patient is wondering if since she is completed 3 months of treatment she can  stop taking her DOAC.  We will discuss this.  This was the only VTE event she is ever had.  And this is when she contracted Covid.  It is considered provoked.  Patient is also had a repeat chest x-ray in May/2021 at [facility].  Showing no acute cardiopulmonary disease.  Patient is also had an echocardiogram at [facility] recently that showed good LV ejection fraction as well as good RV systolic functioning.  Overall patient is feeling well.  Patient also recently completed pulmonary function testing.  Those results are listed below:  12/24/2019-pulmonary function test-FVC 2.63 (76% predicted), postbronchodilator ratio 87, postbronchodilator FEV1 2.22 (84% addicted), no bronchodilator response, DLCO 16.5 (77% predicted)  Questionaires / Pulmonary Flowsheets:   ACT:  No flowsheet data found.  MMRC: No flowsheet data found.  Epworth:  No flowsheet data found.  Tests:   09/18/2019: ANCA negative CCP negative Rheumatoid factor negative ANA negative Antidouble-stranded DNA negative SCL 70 - negative  Chest Imaging CTA chest 09/13/2019-diffuse bilateral peripherally predominant groundglass opacities.  Patulous esophagus.  No PE.  Pulmonary Functions Testing Results: 12/24/2019-pulmonary function test-FVC 2.63 (76% predicted), postbronchodilator ratio 87, postbronchodilator FEV1 2.22 (84% predicted), no bronchodilator response, DLCO 16.5 (77% predicted)  Echocardiogram 09/14/2019: LVEF 60 to 65%, no LVH, normal RV function.   FENO:  No results found for: NITRICOXIDE  PFT: PFT Results Latest Ref Rng & Units 12/24/2019  FVC-Pre L 2.63  FVC-Predicted Pre % 76  FVC-Post L 2.54  FVC-Predicted Post % 73  Pre FEV1/FVC % % 85  Post FEV1/FCV % % 87  FEV1-Pre L 2.24  FEV1-Predicted Pre % 84  FEV1-Post L 2.22  DLCO UNC% % 77  DLCO COR %Predicted % 112  TLC L 4.73  TLC % Predicted % 88  RV % Predicted % 73    WALK:  SIX MIN WALK 12/24/2019  Supplimental Oxygen during Test? (L/min) No   Tech Comments: average pace with no desats    Imaging: No results found.  Lab Results:  CBC    Component Value Date/Time   WBC 15.0 (H) 09/24/2019 0457   RBC 4.05 09/24/2019 0457   HGB 13.3 09/24/2019 0457   HCT 40.6 09/24/2019 0457   PLT 198 09/24/2019 0457   MCV 100.2 (H) 09/24/2019 0457   MCH 32.8 09/24/2019 0457   MCHC 32.8 09/24/2019 0457   RDW 14.0 09/24/2019 0457   LYMPHSABS 2.0 09/22/2019 0420   MONOABS 1.0 09/22/2019 0420   EOSABS 0.0 09/22/2019 0420   BASOSABS 0.1 09/22/2019 0420    BMET    Component Value Date/Time   NA 135 09/24/2019 0556   K 4.7 09/24/2019 0556   CL 96 (L) 09/24/2019 0556   CO2 27 09/24/2019 0556   GLUCOSE 103 (H) 09/24/2019 0556   BUN 34 (H) 09/24/2019 0556   CREATININE 0.71 09/24/2019 0556   CALCIUM 9.5 09/24/2019 0556   GFRNONAA >60 09/24/2019 0556   GFRAA >60 09/24/2019 0556    BNP    Component Value Date/Time   BNP 62.5 09/10/2019 0232    ProBNP No results found for: PROBNP  Specialty Problems      Pulmonary Problems   Pneumonia due to COVID-19 virus   Acute respiratory failure with hypoxia (HCC)   Allergic rhinitis      No Known Allergies  Immunization History  Administered Date(s) Administered  . Influenza, Seasonal, Injecte, Preservative Fre 04/25/2007  . Pneumococcal Polysaccharide-23 10/28/2019    Past Medical History:  Diagnosis Date  . Anemia   . Anxiety   . 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 COVID-19   . GERD (gastroesophageal reflux disease)   . IBS (irritable bowel syndrome)   . Thyroid disease     Tobacco History: Social History   Tobacco Use  Smoking Status Never Smoker  Smokeless Tobacco Never Used   Counseling given: Not Answered   Continue to not smoke  Outpatient Encounter Medications as of 12/24/2019  Medication Sig  . albuterol (VENTOLIN HFA) 108 (90 Base) MCG/ACT inhaler Inhale 1-2 puffs into the lungs every 6 (six) hours  as needed for wheezing or shortness of breath.  Marland Kitchen. apixaban (ELIQUIS) 5 MG TABS tablet Take 2 tablets (10 mg total) by mouth 2 (two) times daily. Take 2 tablets (10mg ) 2 times daily x4 days, then 1 tablet (5mg ) 2 times daily.  Marland Kitchen. ascorbic acid (VITAMIN C) 500 MG tablet Take 1 tablet (500 mg total) by mouth daily.  . benzonatate (TESSALON) 200 MG capsule Take 1 capsule (200 mg total) by mouth 3 (three) times daily as needed for cough.  . clonazePAM (KLONOPIN) 1 MG tablet Take 1 mg by mouth at bedtime.  Marland Kitchen. loratadine (CLARITIN) 10 MG tablet Take 10 mg by mouth daily.  . mometasone-formoterol (DULERA) 200-5 MCG/ACT AERO Inhale 2 puffs into the lungs 2 (two) times daily.  Marland Kitchen. omeprazole (PRILOSEC) 40 MG capsule Take 1 capsule (40 mg total) by mouth daily.  . ondansetron (ZOFRAN-ODT) 4 MG disintegrating tablet Take 4 mg by mouth every 8 (eight) hours as needed for nausea  or vomiting.   . sertraline (ZOLOFT) 100 MG tablet Take 1 tablet (100 mg total) by mouth daily.  Marland Kitchen sulfamethoxazole-trimethoprim (BACTRIM DS) 800-160 MG tablet Take 1 tablet by mouth 3 (three) times a week.  . tiotropium (SPIRIVA HANDIHALER) 18 MCG inhalation capsule Place 1 capsule (18 mcg total) into inhaler and inhale daily.   No facility-administered encounter medications on file as of 12/24/2019.     Review of Systems  Review of Systems  Constitutional: Negative for activity change, fatigue and fever.  HENT: Negative for sinus pressure, sinus pain and sore throat.   Respiratory: Negative for cough, shortness of breath and wheezing.   Cardiovascular: Negative for chest pain and palpitations.  Gastrointestinal: Negative for diarrhea, nausea and vomiting.  Musculoskeletal: Negative for arthralgias.  Neurological: Negative for dizziness.  Psychiatric/Behavioral: Negative for sleep disturbance. The patient is not nervous/anxious.      Physical Exam  BP 132/84 (BP Location: Left Arm, Cuff Size: Normal)   Pulse (!) 108   Temp  98.7 F (37.1 C) (Oral)   Ht 5\' 6"  (1.676 m)   Wt 209 lb 6.4 oz (95 kg)   SpO2 96%   BMI 33.80 kg/m   Wt Readings from Last 5 Encounters:  12/24/19 209 lb 6.4 oz (95 kg)  10/28/19 205 lb 12.8 oz (93.4 kg)  09/23/19 191 lb 15.6 oz (87.1 kg)  09/05/19 218 lb 7.6 oz (99.1 kg)  03/17/16 166 lb (75.3 kg)    BMI Readings from Last 5 Encounters:  12/24/19 33.80 kg/m  10/28/19 33.22 kg/m  09/23/19 30.07 kg/m  09/05/19 37.50 kg/m     Physical Exam Vitals and nursing note reviewed.  Constitutional:      General: She is not in acute distress.    Appearance: Normal appearance. She is obese.  HENT:     Head: Normocephalic and atraumatic.     Right Ear: External ear normal.     Left Ear: External ear normal.     Nose: Congestion and rhinorrhea present.     Mouth/Throat:     Mouth: Mucous membranes are moist.     Pharynx: Oropharynx is clear.     Comments: Postnasal drip Eyes:     Pupils: Pupils are equal, round, and reactive to light.  Cardiovascular:     Rate and Rhythm: Normal rate and regular rhythm.     Pulses: Normal pulses.     Heart sounds: Normal heart sounds. No murmur heard.   Pulmonary:     Effort: Pulmonary effort is normal. No respiratory distress.     Breath sounds: Normal breath sounds. No decreased air movement. No decreased breath sounds, wheezing or rales.  Musculoskeletal:     Cervical back: Normal range of motion.  Skin:    General: Skin is warm and dry.     Capillary Refill: Capillary refill takes less than 2 seconds.  Neurological:     General: No focal deficit present.     Mental Status: She is alert and oriented to person, place, and time. Mental status is at baseline.     Gait: Gait normal.  Psychiatric:        Mood and Affect: Mood normal.        Behavior: Behavior normal.        Thought Content: Thought content normal.        Judgment: Judgment normal.       Assessment & Plan:   Leg DVT (deep venous thromboembolism), acute,  bilateral (HCC) Provoked DVT  in the setting of COVID-19 pneumonia and hospitalization No previous VTE history Patient has completed over 3 months of DOAC therapy Repeat lower extremity Doppler in April/2021 based off of patient's imaging report today shows no evidence of DVTs in bilateral lower extremities  Plan: Okay to stop DOAC today If patient has repeat VTE event we will need to consider hematology referral  Pneumonia due to COVID-19 virus Improving clinically Has finished prednisone taper and tolerated transitioning off.  Has been off of prednisone for about 3 weeks Reviewed pulmonary function testing with patient  Plan: Chest x-ray today If chest x-ray still showing persistent opacities we will proceed forward with high-resolution CT chest Discussed case with Dr. Hazeline Junker to stop Spiriva HandiHaler given pulmonary function testing results  Acute respiratory failure with hypoxia [facility]) Plan: Walk today Chest x-ray today We will continue to clinically monitor  Healthcare maintenance Plan: Would recommend patient obtaining Covid vaccine Okay to stop Spiriva HandiHaler     Return in about 4 months (around 04/25/2020), or if symptoms worsen or fail to improve, for Follow up with Dr. Chestine Spore.   Coral Ceo, NP 12/24/2019   This appointment required 45 minutes of patient care (this includes precharting, chart review, review of results, face-to-face care, etc.).

## 2019-12-24 NOTE — Assessment & Plan Note (Signed)
Improving clinically Has finished prednisone taper and tolerated transitioning off.  Has been off of prednisone for about 3 weeks Reviewed pulmonary function testing with patient  Plan: Chest x-ray today If chest x-ray still showing persistent opacities we will proceed forward with high-resolution CT chest Discussed case with Dr. Hazeline Junker to stop Spiriva HandiHaler given pulmonary function testing results

## 2019-12-24 NOTE — Patient Instructions (Addendum)
You were seen today by Coral Ceo, NP  for:   1. Pneumonia due to COVID-19 virus  - DG Chest 2 View; Future  Walk today in office  Today we reviewed your pulmonary function testing which showed that you do not have any issues taking a deep breath in or blowing air out  I believe you can stop using your Spiriva HandiHaler  2. Leg DVT (deep venous thromboembolism), acute, bilateral (HCC)  You provided documentation today that showed a repeat lower extremity Doppler did not show a DVT in either lower extremity  Based off of this result as well as your 3 months of treatment I believe we can stop your Eliquis  3. Acute respiratory failure with hypoxia (HCC)  Walk today in office   Start nasal saline rinses twice daily Use distilled water Shake well Get bottle lukewarm like a baby bottle  Okay to start fluticasone nasal spray 1 spray each nostril after nasal saline rinses   We recommend today:  Orders Placed This Encounter  Procedures  . DG Chest 2 View    Standing Status:   Future    Standing Expiration Date:   04/25/2020    Order Specific Question:   Reason for Exam (SYMPTOM  OR DIAGNOSIS REQUIRED)    Answer:   s/p covid19 pneumonia    Order Specific Question:   Preferred imaging location?    Answer:   Internal    Order Specific Question:   Radiology Contrast Protocol - do NOT remove file path    Answer:   [email or web address]   Orders Placed This Encounter  Procedures  . DG Chest 2 View   No orders of the defined types were placed in this encounter.   Follow Up:    Return in about 4 months (around 04/25/2020), or if symptoms worsen or fail to improve, for Follow up with Dr. Chestine Spore.   Please do your part to reduce the spread of COVID-19:      Reduce your risk of any infection  and COVID19 by using the similar precautions used for avoiding the common cold or flu:  Marland Kitchen Wash your hands often with soap and warm water for at  least 20 seconds.  If soap and water are not readily available, use an alcohol-based hand sanitizer with at least 60% alcohol.  . If coughing or sneezing, cover your mouth and nose by coughing or sneezing into the elbow areas of your shirt or coat, into a tissue or into your sleeve (not your hands). Drinda Butts A MASK when in public  . Avoid shaking hands with others and consider head nods or verbal greetings only. . Avoid touching your eyes, nose, or mouth with unwashed hands.  . Avoid close contact with people who are sick. . Avoid places or events with large numbers of people in one location, like concerts or sporting events. . If you have some symptoms but not all symptoms, continue to monitor at home and seek medical attention if your symptoms worsen. . If you are having a medical emergency, call 911.   ADDITIONAL HEALTHCARE OPTIONS FOR PATIENTS  [facility] / e-Visit: [email or web address]         [facility]: [phone]  [facility]: [phone]                   [facility]: [phone]     It is flu season:   >>> Best ways to protect herself from  the flu: Receive the yearly flu vaccine, practice good hand hygiene washing with soap and also using hand sanitizer when available, eat a nutritious meals, get adequate rest, hydrate appropriately   Please contact the office if your symptoms worsen or you have concerns that you are not improving.   Thank you for choosing [facility] for your healthcare, and for allowing [place] to partner with you on your healthcare journey. I am thankful to be able to provide care to you today.   Wyn Quaker FNP-C

## 2019-12-24 NOTE — Assessment & Plan Note (Signed)
Provoked DVT in the setting of COVID-19 pneumonia and hospitalization No previous VTE history Patient has completed over 3 months of DOAC therapy Repeat lower extremity Doppler in April/2021 based off of patient's imaging report today shows no evidence of DVTs in bilateral lower extremities  Plan: Okay to stop DOAC today If patient has repeat VTE event we will need to consider hematology referral

## 2019-12-24 NOTE — Progress Notes (Signed)
Full PFT performed today. °

## 2019-12-24 NOTE — Assessment & Plan Note (Signed)
Plan: Would recommend patient obtaining Covid vaccine Okay to stop Spiriva HandiHaler

## 2019-12-24 NOTE — Assessment & Plan Note (Signed)
Plan: Walk today Chest x-ray today We will continue to clinically monitor

## 2019-12-25 NOTE — Progress Notes (Signed)
CXR personally reviewed- minimal RML opacity, significantly improved since March 2021. Doing well off steroids for 3 weeks now. Planning on holding off on HRCT chest at this point. PFTs first. If she has recurrent symptoms, she needs a STAT CXR. I she has opacities suspicious for COP,  would start back on prednisone 40mg  daily and then obtain a CT scan.  , DO 12/25/19 4:06 PM [facility]

## 2019-12-25 NOTE — Progress Notes (Signed)
Patient's chest x-ray shows improving aeration of the lungs.  This is reassuring especially given her clinical response to being off of prednisone.  No new recommendations or changes at this time.Kristen Headland, FNP

## 2019-12-29 NOTE — Progress Notes (Signed)
Please let her know that her PFTs overall look very good and this will serve as a baseline if she has recurrent symptoms in the future.  Thanks! LPC

## 2020-03-06 ENCOUNTER — Encounter: Payer: Self-pay | Admitting: Adult Health

## 2020-03-06 ENCOUNTER — Other Ambulatory Visit: Payer: Self-pay

## 2020-03-06 ENCOUNTER — Ambulatory Visit (INDEPENDENT_AMBULATORY_CARE_PROVIDER_SITE_OTHER): Payer: Self-pay | Admitting: Adult Health

## 2020-03-06 VITALS — BP 140/84 | HR 90 | Temp 97.2°F | Ht 62.0 in | Wt 207.4 lb

## 2020-03-06 DIAGNOSIS — U071 COVID-19: Secondary | ICD-10-CM

## 2020-03-06 DIAGNOSIS — Z23 Encounter for immunization: Secondary | ICD-10-CM

## 2020-03-06 DIAGNOSIS — J1282 Pneumonia due to coronavirus disease 2019: Secondary | ICD-10-CM

## 2020-03-06 MED ORDER — ALBUTEROL SULFATE HFA 108 (90 BASE) MCG/ACT IN AERS: 1.0000 | INHALATION_SPRAY | Freq: Four times a day (QID) | RESPIRATORY_TRACT | 2 refills | Status: AC | PRN

## 2020-03-06 NOTE — Progress Notes (Signed)
@Patient  ID: , female    DOB: 1956-01-21, 64 y.o.   MRN: [id number]  Chief Complaint  Patient presents with  . Follow-up    Cough     Referring provider: [id number], FNP  HPI: 64 year old female never smoker followed in our office for Boop/COP Had acute respiratory failure with hypoxemia and Covid pneumonia from COVID-19 infection March 2021 (treated with steroids remdesivir and Actemra), + provoked  DVT 08/2019 tx w/ 3 months of DOAC  TEST/EVENTS :  10/09/2019-venous Doppler extremity ultrasounds-no evidence of DVT in bilateral lower extremities  12/24/2019-pulmonary function test-FVC 2.63 (76% predicted), postbronchodilator ratio 87, postbronchodilator FEV1 2.22 (84% predicted ), no bronchodilator response, DLCO 16.5 (77% predicted)  CTA chest 09/13/2019-diffuse bilateral peripherally predominant groundglass opacities.  Patulous esophagus.  No PE.  Echocardiogram 09/14/2019: LVEF 60 to 65%, no LVH, normal RV function  ANCA negative CCP negative Rheumatoid factor negative ANA negative Antidouble-stranded DNA negative SCL 70 - negative  03/06/2020 Follow up : BOOP/COP , Previous Covid PNA  Patient returns for a 39-month follow-up.  Patient was seen for initial consult during hospitalization for COVID-19 with severe acute respiratory failure with hypoxemia Covid pneumonia and provoked DVT in March 2021.  She was treated with steroids, remdesivir and Actemra.  She had slow improvement and was treated for concern of possible Boop as she had steroid responsive infiltrates.  Since last visit patient is feeling better. Still has dry cough but no dyspnea. Back to all precovid activities without difficulty .   She is weaned off of oxygen.  She is now off of all steroids. Had chest xray on 9/13, showed improved opacities in the right lung with trace residual . No acute process report from care everywhere.   Mom passed away yesterday from Covid .     No Known  Allergies  Immunization History  Administered Date(s) Administered  . Influenza, Seasonal, Injecte, Preservative Fre 04/25/2007  . PFIZER SARS-COV-2 Vaccination 12/25/2019, 01/15/2020  . Pneumococcal Polysaccharide-23 10/28/2019    Past Medical History:  Diagnosis Date  . Anemia   . Anxiety   . 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 COVID-19   . GERD (gastroesophageal reflux disease)   . IBS (irritable bowel syndrome)   . Thyroid disease     Tobacco History: Social History   Tobacco Use  Smoking Status Never Smoker  Smokeless Tobacco Never Used   Counseling given: Not Answered   Outpatient Medications Prior to Visit  Medication Sig Dispense Refill  . cetirizine (ZYRTEC) 10 MG tablet Take 10 mg by mouth daily.    . benzonatate (TESSALON) 200 MG capsule Take 1 capsule (200 mg total) by mouth 3 (three) times daily as needed for cough. (Patient not taking: Reported on 03/06/2020) 20 capsule 0  . mometasone-formoterol (DULERA) 200-5 MCG/ACT AERO Inhale 2 puffs into the lungs 2 (two) times daily. (Patient not taking: Reported on 03/06/2020) 13 g 1  . tiotropium (SPIRIVA HANDIHALER) 18 MCG inhalation capsule Place 1 capsule (18 mcg total) into inhaler and inhale daily. (Patient not taking: Reported on 03/06/2020) 30 capsule 2  . albuterol (VENTOLIN HFA) 108 (90 Base) MCG/ACT inhaler Inhale 1-2 puffs into the lungs every 6 (six) hours as needed for wheezing or shortness of breath.    03/08/2020 apixaban (ELIQUIS) 5 MG TABS tablet Take 2 tablets (10 mg total) by mouth 2 (two) times daily. Take 2 tablets (10mg ) 2 times daily x4 days, then 1 tablet (5mg )  2 times daily. 60 tablet 2  . ascorbic acid (VITAMIN C) 500 MG tablet Take 1 tablet (500 mg total) by mouth daily.    . clonazePAM (KLONOPIN) 1 MG tablet Take 1 mg by mouth at bedtime.    Marland Kitchen loratadine (CLARITIN) 10 MG tablet Take 10 mg by mouth daily.    Marland Kitchen omeprazole (PRILOSEC) 40 MG capsule Take 1  capsule (40 mg total) by mouth daily. 30 capsule 3  . ondansetron (ZOFRAN-ODT) 4 MG disintegrating tablet Take 4 mg by mouth every 8 (eight) hours as needed for nausea or vomiting.     . sertraline (ZOLOFT) 100 MG tablet Take 1 tablet (100 mg total) by mouth daily. 30 tablet 0  . sulfamethoxazole-trimethoprim (BACTRIM DS) 800-160 MG tablet Take 1 tablet by mouth 3 (three) times a week. 15 tablet 0   No facility-administered medications prior to visit.     Review of Systems:   Constitutional:   No  weight loss, night sweats,  Fevers, chills, + fatigue, or  lassitude.  HEENT:   No headaches,  Difficulty swallowing,  Tooth/dental problems, or  Sore throat,                No sneezing, itching, ear ache, nasal congestion, post nasal drip,   CV:  No chest pain,  Orthopnea, PND, swelling in lower extremities, anasarca, dizziness, palpitations, syncope.   GI  No heartburn, indigestion, abdominal pain, nausea, vomiting, diarrhea, change in bowel habits, loss of appetite, bloody stools.   Resp:   No chest wall deformity  Skin: no rash or lesions.  GU: no dysuria, change in color of urine, no urgency or frequency.  No flank pain, no hematuria   MS:  No joint pain or swelling.  No decreased range of motion.  No back pain.    Physical Exam  BP 140/84 (BP Location: Left Arm, Cuff Size: Normal)   Pulse 90   Temp (!) 97.2 F (36.2 C) (Temporal)   Ht 5\' 2"  (1.575 m)   Wt 207 lb 6.4 oz (94.1 kg)   SpO2 96% Comment: RA  BMI 37.93 kg/m   GEN: A/Ox3; pleasant , NAD, well nourished    HEENT:  [place]/AT,  , NOSE-clear, THROAT-clear, no lesions, no postnasal drip or exudate noted.   NECK:  Supple w/ fair ROM; no JVD; normal carotid impulses w/o bruits; no thyromegaly or nodules palpated; no lymphadenopathy.    RESP  Clear  P & A; w/o, wheezes/ rales/ or rhonchi. no accessory muscle use, no dullness to percussion  CARD:  RRR, no m/r/g, no peripheral edema, pulses intact, no cyanosis or  clubbing.  GI:   Soft & nt; nml bowel sounds; no organomegaly or masses detected.   Musco: Warm bil, no deformities or joint swelling noted.   Neuro: alert, no focal deficits noted.    Skin: Warm, no lesions or rashes    Lab Results:   BMET   ProBNP No results found for: PROBNP  Imaging: No results found.    PFT Results Latest Ref Rng & Units 12/24/2019  FVC-Pre L 2.63  FVC-Predicted Pre % 76  FVC-Post L 2.54  FVC-Predicted Post % 73  Pre FEV1/FVC % % 85  Post FEV1/FCV % % 87  FEV1-Pre L 2.24  FEV1-Predicted Pre % 84  FEV1-Post L 2.22  DLCO uncorrected ml/min/mmHg 16.50  DLCO UNC% % 77  DLCO corrected ml/min/mmHg 16.50  DLCO COR %Predicted % 77  DLVA Predicted % 112  TLC L 4.73  TLC % Predicted % 88  RV % Predicted % 73    No results found for: NITRICOXIDE      Assessment & Plan:   Pneumonia due to COVID-19 virus  Covid March 2021 with suspected complication of Boop/COP which was steroid responsive.  Patient was slowly weaned off of all steroids and's been off of steroids for greater than 2 to 3 months.  She has had no flare of shortness of breath.  She does have a residual dry cough that seems to be mild in nature.  Chest x-ray reviewed in care everywhere with further improvement in infiltrates.  Patient is off of all inhalers.  Have advised her to use her Zyrtec and Delsym as needed for cough.  May use albuterol if needed.  Previous pulmonary function testing showed minimum restriction with no airflow obstruction.  Plan  Patient Instructions  Albuterol inhaler 1-2 puffs every 6hr as needed for wheezing -this is your rescue inhaler .  Flu shot today  Continue on Zyrtec 10mg  At bedtime   Delsym 2 tsp Twice daily  As needed  Cough .  Follow up with Dr. or Sharanda Shinault NP in 4-6 months and As needed           Francine Graven, NP 03/06/2020

## 2020-03-06 NOTE — Assessment & Plan Note (Signed)
  Covid March 2021 with suspected complication of Boop/COP which was steroid responsive.  Patient was slowly weaned off of all steroids and's been off of steroids for greater than 2 to 3 months.  She has had no flare of shortness of breath.  She does have a residual dry cough that seems to be mild in nature.  Chest x-ray reviewed in care everywhere with further improvement in infiltrates.  Patient is off of all inhalers.  Have advised her to use her Zyrtec and Delsym as needed for cough.  May use albuterol if needed.  Previous pulmonary function testing showed minimum restriction with no airflow obstruction.  Plan  Patient Instructions  Albuterol inhaler 1-2 puffs every 6hr as needed for wheezing -this is your rescue inhaler .  Flu shot today  Continue on Zyrtec 10mg  At bedtime   Delsym 2 tsp Twice daily  As needed  Cough .  Follow up with Dr. or Breianna Delfino NP in 4-6 months and As needed

## 2020-03-06 NOTE — Patient Instructions (Signed)
Albuterol inhaler 1-2 puffs every 6hr as needed for wheezing -this is your rescue inhaler .  Flu shot today  Continue on Zyrtec 10mg  At bedtime   Delsym 2 tsp Twice daily  As needed  Cough .  Follow up with Dr. or Yareth Macdonnell NP in 4-6 months and As needed

## 2020-07-06 ENCOUNTER — Ambulatory Visit: Payer: Self-pay | Admitting: Adult Health

## 2021-02-04 IMAGING — CT CT ANGIO CHEST
2 of 6 series · 19 of 46 positions shown · IV contrast (OMNIPAQUE)
Comparison: September 02, 2019

CLINICAL DATA: Shortness of breath.

EXAM:
CT ANGIOGRAPHY CHEST WITH CONTRAST
TECHNIQUE: Multidetector CT imaging of the chest was performed using the
standard protocol during bolus administration of intravenous
contrast. Multiplanar CT image reconstructions and MIPs were
obtained to evaluate the vascular anatomy.
CONTRAST:  100mL OMNIPAQUE IOHEXOL 350 MG/ML SOLN

[Series 5: thins · axial · 0.58mm/px · z∈[-136,+99]mm · 17 of 259 slices shown]
[im 12/259  lung]
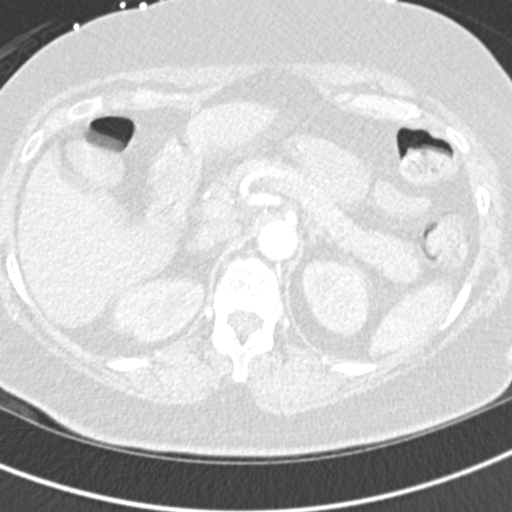
[im 23/259  soft-tissue]
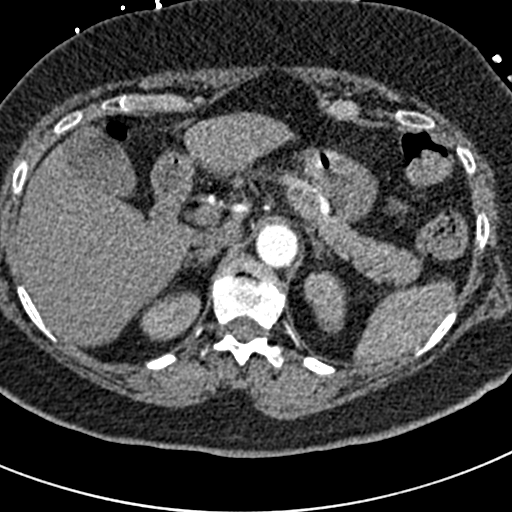
[im 45/259  lung]
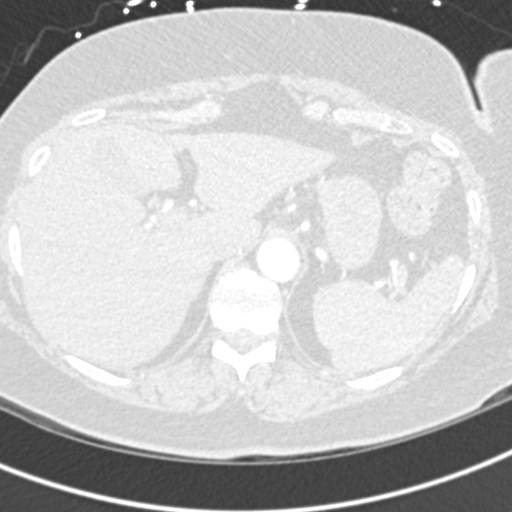
[im 57/259  soft-tissue]
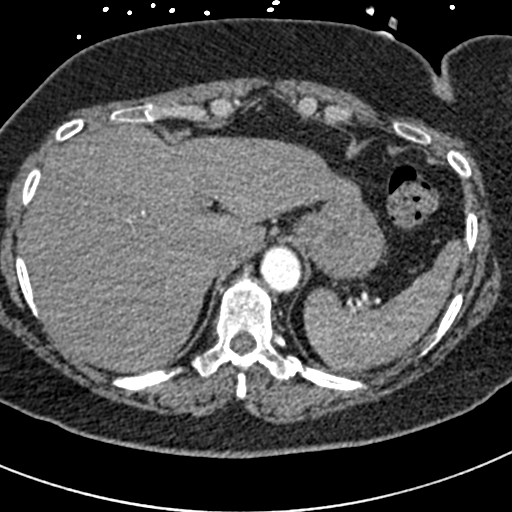
[im 68/259  lung]
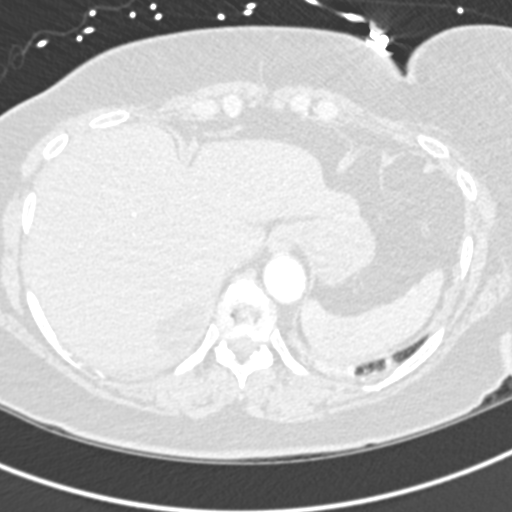
[im 90/259  soft-tissue]
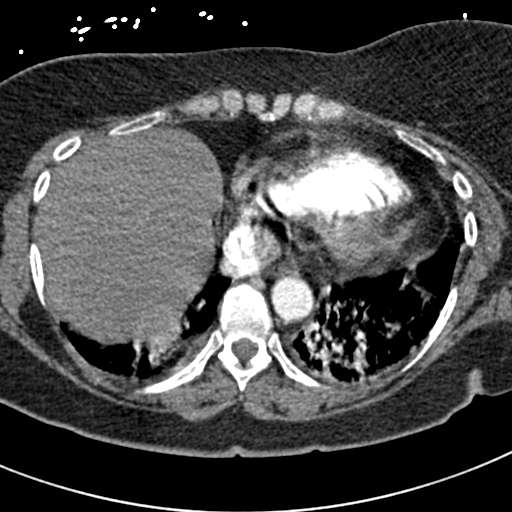
[im 101/259  lung]
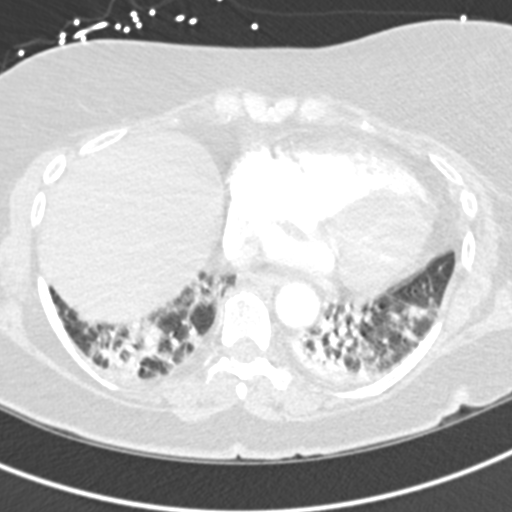
[im 113/259  soft-tissue]
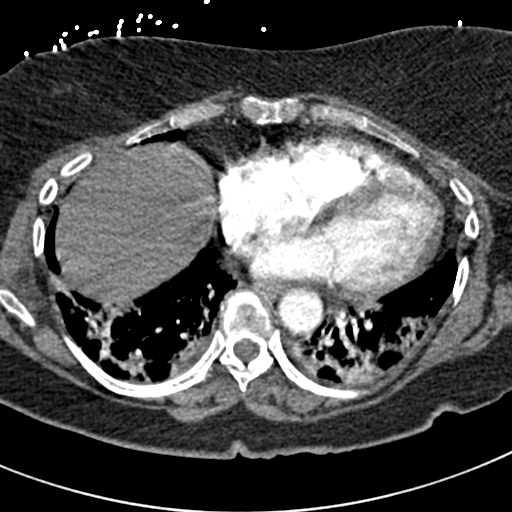
[im 135/259  lung]
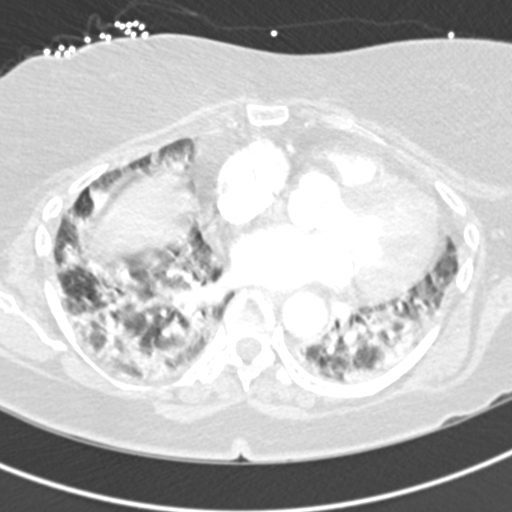
[im 146/259  soft-tissue]
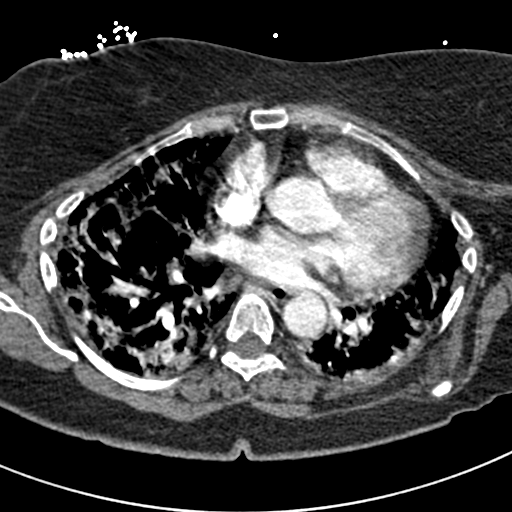
[im 158/259  lung]
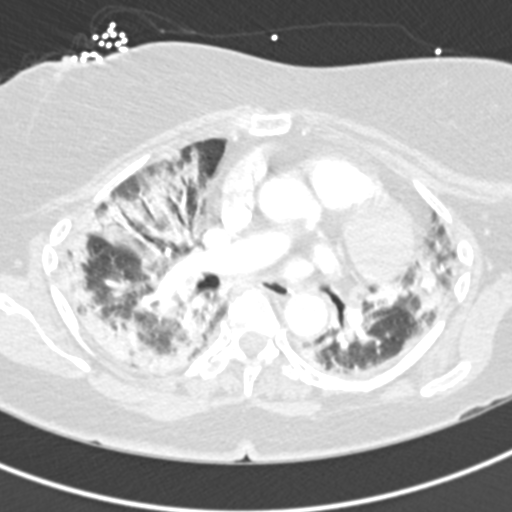
[im 169/259  soft-tissue]
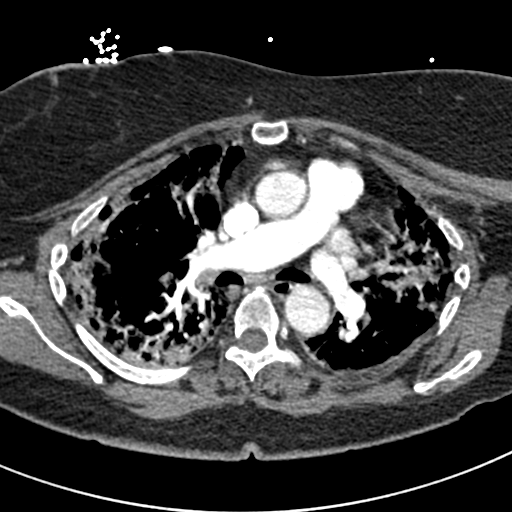
[im 191/259  lung]
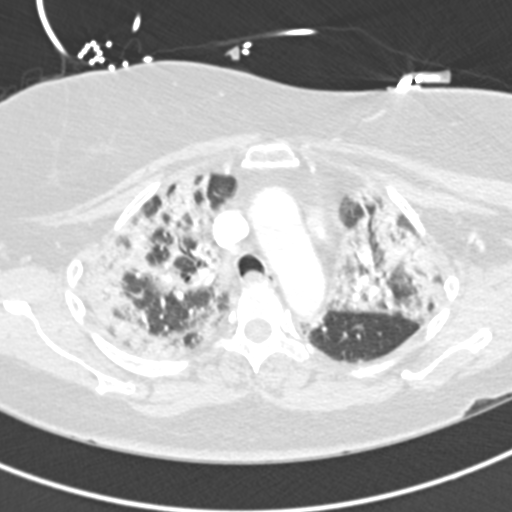
[im 202/259  soft-tissue]
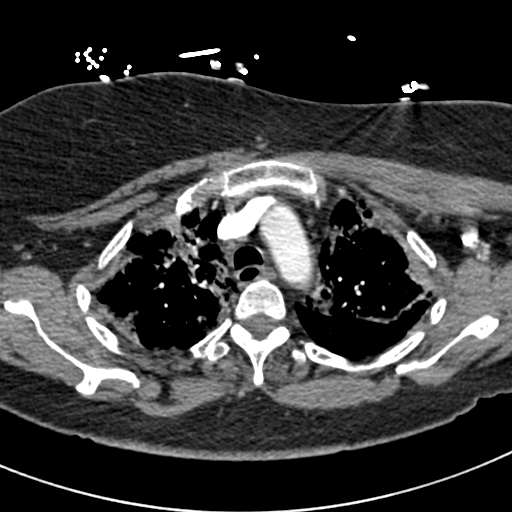
[im 214/259  lung]
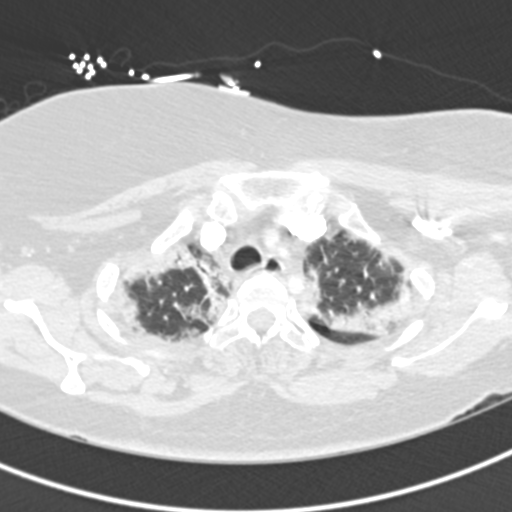
[im 236/259  soft-tissue]
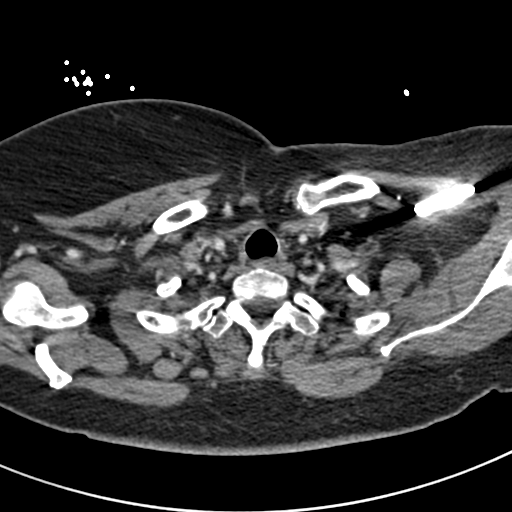
[im 247/259  lung]
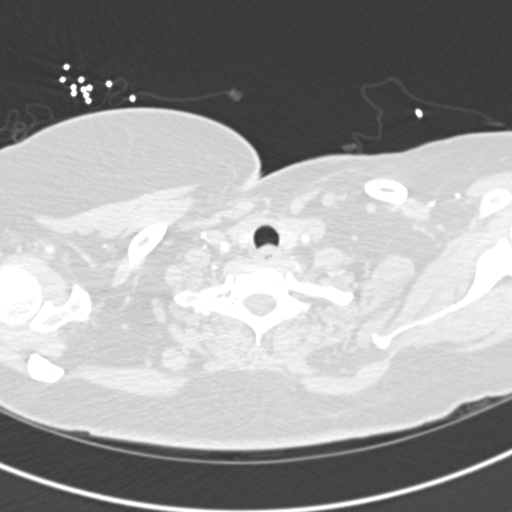

[Series 7: coronal mpr · coronal · 0.51mm/px · 2 of 73 slices shown]
[im 25/73  soft-tissue]
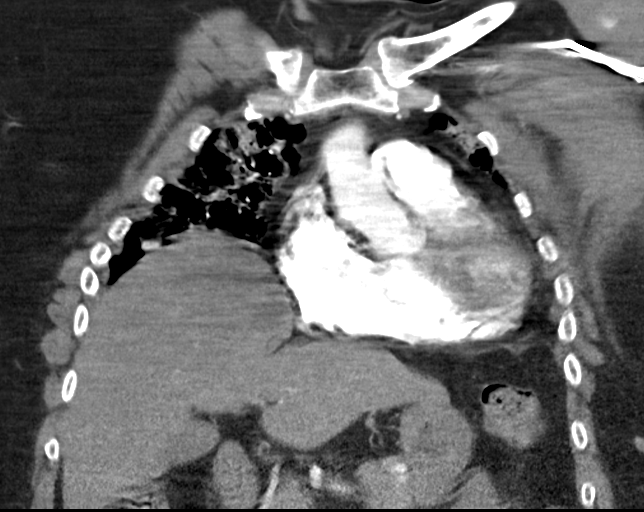
[im 49/73  soft-tissue]
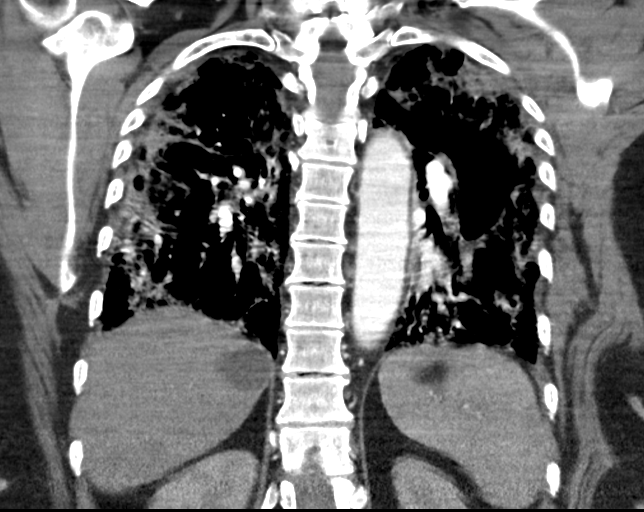

[19 of 46 positions shown; findings below may reference images not displayed]

FINDINGS: Cardiovascular: Satisfactory opacification of the pulmonary arteries
to the segmental level. No evidence of pulmonary embolism. Normal
heart size. No pericardial effusion.

Mediastinum/Nodes: No enlarged mediastinal, hilar, or axillary lymph
nodes. Thyroid gland, trachea, and esophagus demonstrate no
significant findings.

Lungs/Pleura: Marked severity patchy infiltrates are seen throughout
both lungs. This is markedly increased in severity when compared to
the prior study.

There is no evidence of a pleural effusion or pneumothorax.

Upper Abdomen: A stable 3.6 cm x 2.2 cm cystic appearing area is
seen within the posteromedial aspect of the right lobe of the liver.

Musculoskeletal: No chest wall abnormality. No acute or significant
osseous findings.

Review of the MIP images confirms the above findings.
IMPRESSION: 1. No evidence of pulmonary embolism.
2. Marked severity patchy bilateral infiltrates, markedly increased
in severity when compared to the prior study.
